# Patient Record
Sex: Male | Born: 1955 | Race: White | Hispanic: No | Marital: Single | Smoking: Current every day smoker
Health system: Southern US, Community
[De-identification: ages and names within clinical notes are randomized; demographics above are authoritative.]

## PROBLEM LIST (undated history)

## (undated) DIAGNOSIS — K08199 Complete loss of teeth due to other specified cause, unspecified class: Secondary | ICD-10-CM

## (undated) DIAGNOSIS — R7309 Other abnormal glucose: Secondary | ICD-10-CM

## (undated) DIAGNOSIS — Z8619 Personal history of other infectious and parasitic diseases: Secondary | ICD-10-CM

## (undated) DIAGNOSIS — E119 Type 2 diabetes mellitus without complications: Secondary | ICD-10-CM

## (undated) DIAGNOSIS — N400 Enlarged prostate without lower urinary tract symptoms: Secondary | ICD-10-CM

## (undated) DIAGNOSIS — Z8719 Personal history of other diseases of the digestive system: Secondary | ICD-10-CM

## (undated) DIAGNOSIS — M1990 Unspecified osteoarthritis, unspecified site: Secondary | ICD-10-CM

## (undated) DIAGNOSIS — K219 Gastro-esophageal reflux disease without esophagitis: Secondary | ICD-10-CM

## (undated) DIAGNOSIS — J439 Emphysema, unspecified: Secondary | ICD-10-CM

## (undated) DIAGNOSIS — G709 Myoneural disorder, unspecified: Secondary | ICD-10-CM

## (undated) DIAGNOSIS — J449 Chronic obstructive pulmonary disease, unspecified: Secondary | ICD-10-CM

## (undated) DIAGNOSIS — C50919 Malignant neoplasm of unspecified site of unspecified female breast: Secondary | ICD-10-CM

## (undated) DIAGNOSIS — C801 Malignant (primary) neoplasm, unspecified: Secondary | ICD-10-CM

## (undated) DIAGNOSIS — I10 Essential (primary) hypertension: Secondary | ICD-10-CM

## (undated) DIAGNOSIS — C50822 Malignant neoplasm of overlapping sites of left male breast: Secondary | ICD-10-CM

## (undated) DIAGNOSIS — Z170 Estrogen receptor positive status [ER+]: Secondary | ICD-10-CM

## (undated) DIAGNOSIS — Z720 Tobacco use: Secondary | ICD-10-CM

## (undated) DIAGNOSIS — Z8489 Family history of other specified conditions: Secondary | ICD-10-CM

## (undated) DIAGNOSIS — F419 Anxiety disorder, unspecified: Secondary | ICD-10-CM

## (undated) DIAGNOSIS — R0600 Dyspnea, unspecified: Secondary | ICD-10-CM

## (undated) DIAGNOSIS — B86 Scabies: Secondary | ICD-10-CM

## (undated) DIAGNOSIS — T7840XA Allergy, unspecified, initial encounter: Secondary | ICD-10-CM

## (undated) HISTORY — DX: Complete loss of teeth due to other specified cause, unspecified class: K08.199

## (undated) HISTORY — PX: SKIN CANCER EXCISION: SHX779

## (undated) HISTORY — PX: MOUTH SURGERY: SHX715

## (undated) HISTORY — DX: Personal history of other infectious and parasitic diseases: Z86.19

## (undated) HISTORY — DX: Scabies: B86

## (undated) HISTORY — DX: Myoneural disorder, unspecified: G70.9

## (undated) HISTORY — PX: LIPOMA EXCISION: SHX5283

## (undated) HISTORY — DX: Gastro-esophageal reflux disease without esophagitis: K21.9

## (undated) HISTORY — DX: Emphysema, unspecified: J43.9

## (undated) HISTORY — PX: CYST EXCISION: SHX5701

## (undated) HISTORY — DX: Malignant neoplasm of unspecified site of unspecified female breast: C50.919

## (undated) HISTORY — DX: Allergy, unspecified, initial encounter: T78.40XA

## (undated) HISTORY — DX: Anxiety disorder, unspecified: F41.9

---
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
## 2004-10-29 ENCOUNTER — Emergency Department: Payer: Self-pay | Admitting: General Practice

## 2004-10-29 ENCOUNTER — Other Ambulatory Visit: Payer: Self-pay

## 2020-01-12 ENCOUNTER — Other Ambulatory Visit: Payer: Self-pay

## 2020-01-14 ENCOUNTER — Ambulatory Visit (INDEPENDENT_AMBULATORY_CARE_PROVIDER_SITE_OTHER): Payer: Medicare Other

## 2020-01-14 ENCOUNTER — Ambulatory Visit (INDEPENDENT_AMBULATORY_CARE_PROVIDER_SITE_OTHER): Payer: Medicare Other | Admitting: Nurse Practitioner

## 2020-01-14 ENCOUNTER — Other Ambulatory Visit: Payer: Self-pay

## 2020-01-14 ENCOUNTER — Encounter: Payer: Self-pay | Admitting: Nurse Practitioner

## 2020-01-14 VITALS — BP 138/72 | HR 83 | Temp 97.4°F | Ht 74.0 in | Wt 232.0 lb

## 2020-01-14 DIAGNOSIS — Z6841 Body Mass Index (BMI) 40.0 and over, adult: Secondary | ICD-10-CM | POA: Diagnosis not present

## 2020-01-14 DIAGNOSIS — J449 Chronic obstructive pulmonary disease, unspecified: Secondary | ICD-10-CM | POA: Insufficient documentation

## 2020-01-14 DIAGNOSIS — Z720 Tobacco use: Secondary | ICD-10-CM | POA: Insufficient documentation

## 2020-01-14 DIAGNOSIS — R600 Localized edema: Secondary | ICD-10-CM | POA: Insufficient documentation

## 2020-01-14 DIAGNOSIS — R0600 Dyspnea, unspecified: Secondary | ICD-10-CM

## 2020-01-14 DIAGNOSIS — R2241 Localized swelling, mass and lump, right lower limb: Secondary | ICD-10-CM

## 2020-01-14 DIAGNOSIS — R079 Chest pain, unspecified: Secondary | ICD-10-CM | POA: Diagnosis not present

## 2020-01-14 DIAGNOSIS — Z0000 Encounter for general adult medical examination without abnormal findings: Secondary | ICD-10-CM

## 2020-01-14 DIAGNOSIS — N649 Disorder of breast, unspecified: Secondary | ICD-10-CM

## 2020-01-14 DIAGNOSIS — Z7189 Other specified counseling: Secondary | ICD-10-CM | POA: Insufficient documentation

## 2020-01-14 DIAGNOSIS — Z91030 Bee allergy status: Secondary | ICD-10-CM

## 2020-01-14 DIAGNOSIS — N6310 Unspecified lump in the right breast, unspecified quadrant: Secondary | ICD-10-CM | POA: Insufficient documentation

## 2020-01-14 DIAGNOSIS — R0609 Other forms of dyspnea: Secondary | ICD-10-CM

## 2020-01-14 HISTORY — DX: Localized swelling, mass and lump, right lower limb: R22.41

## 2020-01-14 LAB — LIPID PANEL
Cholesterol: 224 mg/dL — ABNORMAL HIGH (ref 0–200)
HDL: 38.1 mg/dL — ABNORMAL LOW (ref >39.00)
LDL Cholesterol: 154 mg/dL — ABNORMAL HIGH (ref 0–99)
NonHDL: 185.98
Total CHOL/HDL Ratio: 6
Triglycerides: 162 mg/dL — ABNORMAL HIGH (ref 0.0–149.0)
VLDL: 32.4 mg/dL (ref 0.0–40.0)

## 2020-01-14 LAB — TSH: TSH: 1.3 u[IU]/mL (ref 0.35–4.50)

## 2020-01-14 LAB — CBC WITH DIFFERENTIAL/PLATELET
Basophils Absolute: 0.1 10*3/uL (ref 0.0–0.1)
Basophils Relative: 0.8 % (ref 0.0–3.0)
Eosinophils Absolute: 0.3 10*3/uL (ref 0.0–0.7)
Eosinophils Relative: 2.8 % (ref 0.0–5.0)
HCT: 48.2 % (ref 39.0–52.0)
Hemoglobin: 16.2 g/dL (ref 13.0–17.0)
Lymphocytes Relative: 24.7 % (ref 12.0–46.0)
Lymphs Abs: 2.4 10*3/uL (ref 0.7–4.0)
MCHC: 33.6 g/dL (ref 30.0–36.0)
MCV: 89 fl (ref 78.0–100.0)
Monocytes Absolute: 0.9 10*3/uL (ref 0.1–1.0)
Monocytes Relative: 8.9 % (ref 3.0–12.0)
Neutro Abs: 6 10*3/uL (ref 1.4–7.7)
Neutrophils Relative %: 62.8 % (ref 43.0–77.0)
Platelets: 196 10*3/uL (ref 150.0–400.0)
RBC: 5.41 Mil/uL (ref 4.22–5.81)
RDW: 13.9 % (ref 11.5–15.5)
WBC: 9.6 10*3/uL (ref 4.0–10.5)

## 2020-01-14 LAB — COMPREHENSIVE METABOLIC PANEL
ALT: 17 U/L (ref 0–53)
AST: 16 U/L (ref 0–37)
Albumin: 4 g/dL (ref 3.5–5.2)
Alkaline Phosphatase: 56 U/L (ref 39–117)
BUN: 11 mg/dL (ref 6–23)
CO2: 27 mEq/L (ref 19–32)
Calcium: 9.1 mg/dL (ref 8.4–10.5)
Chloride: 100 mEq/L (ref 96–112)
Creatinine, Ser: 0.89 mg/dL (ref 0.40–1.50)
GFR: 85.57 mL/min (ref >60.00)
Glucose, Bld: 105 mg/dL — ABNORMAL HIGH (ref 70–99)
Potassium: 4 mEq/L (ref 3.5–5.1)
Sodium: 134 mEq/L — ABNORMAL LOW (ref 135–145)
Total Bilirubin: 0.5 mg/dL (ref 0.2–1.2)
Total Protein: 6.8 g/dL (ref 6.0–8.3)

## 2020-01-14 LAB — URINALYSIS, ROUTINE W REFLEX MICROSCOPIC
Bilirubin Urine: NEGATIVE
Hgb urine dipstick: NEGATIVE
Ketones, ur: NEGATIVE
Leukocytes,Ua: NEGATIVE
Nitrite: NEGATIVE
RBC / HPF: NONE SEEN (ref 0–?)
Specific Gravity, Urine: 1.02 (ref 1.000–1.030)
Total Protein, Urine: NEGATIVE
Urine Glucose: NEGATIVE
Urobilinogen, UA: 0.2 (ref 0.0–1.0)
pH: 7 (ref 5.0–8.0)

## 2020-01-14 MED ORDER — EPINEPHRINE 0.3 MG/0.3ML IJ SOAJ: 0.3000 mg | Freq: Once | INTRAMUSCULAR | Status: DC
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
## 2020-01-14 NOTE — Patient Instructions (Addendum)
It was wonderful to meet you today.  Please go to the lab and chest x-ray today down the hall.   I will place referral into cardiology to evaluate your chest tightness and shortness of breath with exertion.   Please take it easy and do not over exert yourself. If you develop chest pain that persists or has shortness of breath, call 911 for EMS transport to the hospital.  I have placed a order for mammogram and ultrasound of the right breast to check out the tenderness and lumps that she had noticed.  I have also placed a referral to general surgeon to check out that the lump along your right hip.  It causes you leg pain.  Follow-up with me in 1 week to go over labs, and make further recommendations as needed.   Please consider cutting back and stopping smoking.  Also, I would recommend healthy diet and consumption of less fast food at this time.  Try to elevate your legs when you are sitting to help with the ankle swelling.  I have refilled your EPI- pen- check the cost.   DASH Eating Plan DASH stands for "Dietary Approaches to Stop Hypertension." The DASH eating plan is a healthy eating plan that has been shown to reduce high blood pressure (hypertension). It may also reduce your risk for type 2 diabetes, heart disease, and stroke. The DASH eating plan may also help with weight loss. What are tips for following this plan?  General guidelines  Avoid eating more than 2,300 mg (milligrams) of salt (sodium) a day. If you have hypertension, you may need to reduce your sodium intake to 1,500 mg a day.  Limit alcohol intake to no more than 1 drink a day for nonpregnant women and 2 drinks a day for men. One drink equals 12 oz of beer, 5 oz of wine, or 1 oz of hard liquor.  Work with your health care provider to maintain a healthy body weight or to lose weight. Ask what an ideal weight is for you.  Get at least 30 minutes of exercise that causes your heart to beat faster (aerobic exercise)  most days of the week. Activities may include walking, swimming, or biking.  Work with your health care provider or diet and nutrition specialist (dietitian) to adjust your eating plan to your individual calorie needs. Reading food labels   Check food labels for the amount of sodium per serving. Choose foods with less than 5 percent of the Daily Value of sodium. Generally, foods with less than 300 mg of sodium per serving fit into this eating plan.  To find whole grains, look for the word "whole" as the first word in the ingredient list. Shopping  Buy products labeled as "low-sodium" or "no salt added."  Buy fresh foods. Avoid canned foods and premade or frozen meals. Cooking  Avoid adding salt when cooking. Use salt-free seasonings or herbs instead of table salt or sea salt. Check with your health care provider or pharmacist before using salt substitutes.  Do not fry foods. Cook foods using healthy methods such as baking, boiling, grilling, and broiling instead.  Cook with heart-healthy oils, such as olive, canola, soybean, or sunflower oil. Meal planning  Eat a balanced diet that includes: ? 5 or more servings of fruits and vegetables each day. At each meal, try to fill half of your plate with fruits and vegetables. ? Up to 6-8 servings of whole grains each day. ? Less than 6 oz of  lean meat, poultry, or fish each day. A 3-oz serving of meat is about the same size as a deck of cards. One egg equals 1 oz. ? 2 servings of low-fat dairy each day. ? A serving of nuts, seeds, or beans 5 times each week. ? Heart-healthy fats. Healthy fats called Omega-3 fatty acids are found in foods such as flaxseeds and coldwater fish, like sardines, salmon, and mackerel.  Limit how much you eat of the following: ? Canned or prepackaged foods. ? Food that is high in trans fat, such as fried foods. ? Food that is high in saturated fat, such as fatty meat. ? Sweets, desserts, sugary drinks, and other  foods with added sugar. ? Full-fat dairy products.  Do not salt foods before eating.  Try to eat at least 2 vegetarian meals each week.  Eat more home-cooked food and less restaurant, buffet, and fast food.  When eating at a restaurant, ask that your food be prepared with less salt or no salt, if possible. What foods are recommended? The items listed may not be a complete list. Talk with your dietitian about what dietary choices are best for you. Grains Whole-grain or whole-wheat bread. Whole-grain or whole-wheat pasta. Brown rice. [place]. Bulgur. Whole-grain and low-sodium cereals. Pita bread. Low-fat, low-sodium crackers. Whole-wheat flour tortillas. Vegetables Fresh or frozen vegetables (raw, steamed, roasted, or grilled). Low-sodium or reduced-sodium tomato and vegetable juice. Low-sodium or reduced-sodium tomato sauce and tomato paste. Low-sodium or reduced-sodium canned vegetables. Fruits All fresh, dried, or frozen fruit. Canned fruit in natural juice (without added sugar). Meat and other protein foods Skinless chicken or [place]. Ground chicken or [place]. Pork with fat trimmed off. Fish and seafood. Egg whites. Dried beans, peas, or lentils. Unsalted nuts, nut butters, and seeds. Unsalted canned beans. Lean cuts of beef with fat trimmed off. Low-sodium, lean deli meat. Dairy Low-fat (1%) or fat-free (skim) milk. Fat-free, low-fat, or reduced-fat cheeses. Nonfat, low-sodium ricotta or cottage cheese. Low-fat or nonfat yogurt. Low-fat, low-sodium cheese. Fats and oils Soft margarine without trans fats. Vegetable oil. Low-fat, reduced-fat, or light mayonnaise and salad dressings (reduced-sodium). Canola, safflower, olive, soybean, and sunflower oils. Avocado. Seasoning and other foods Herbs. Spices. Seasoning mixes without salt. Unsalted popcorn and pretzels. Fat-free sweets. What foods are not recommended? The items listed may not be a complete list. Talk with your dietitian  about what dietary choices are best for you. Grains Baked goods made with fat, such as croissants, muffins, or some breads. Dry pasta or rice meal packs. Vegetables Creamed or fried vegetables. Vegetables in a cheese sauce. Regular canned vegetables (not low-sodium or reduced-sodium). Regular canned tomato sauce and paste (not low-sodium or reduced-sodium). Regular tomato and vegetable juice (not low-sodium or reduced-sodium). Angie Fava. Olives. Fruits Canned fruit in a light or heavy syrup. Fried fruit. Fruit in cream or butter sauce. Meat and other protein foods Fatty cuts of meat. Ribs. Fried meat. Berniece Salines. Sausage. Bologna and other processed lunch meats. Salami. Fatback. Hotdogs. Bratwurst. Salted nuts and seeds. Canned beans with added salt. Canned or smoked fish. Whole eggs or egg yolks. Chicken or [place] with skin. Dairy Whole or 2% milk, cream, and half-and-half. Whole or full-fat cream cheese. Whole-fat or sweetened yogurt. Full-fat cheese. Nondairy creamers. Whipped toppings. Processed cheese and cheese spreads. Fats and oils Butter. Stick margarine. Lard. Shortening. Ghee. Bacon fat. Tropical oils, such as coconut, palm kernel, or palm oil. Seasoning and other foods Salted popcorn and pretzels. Onion salt, garlic salt, seasoned salt, table  salt, and sea salt. Worcestershire sauce. Tartar sauce. Barbecue sauce. Teriyaki sauce. Soy sauce, including reduced-sodium. Steak sauce. Canned and packaged gravies. Fish sauce. Oyster sauce. Cocktail sauce. Horseradish that you find on the shelf. Ketchup. Mustard. Meat flavorings and tenderizers. Bouillon cubes. Hot sauce and Tabasco sauce. Premade or packaged marinades. Premade or packaged taco seasonings. Relishes. Regular salad dressings. Where to find more information:  National Heart, Lung, and [place]: https://wilson-eaton.com/  American Heart Association: www.heart.org Summary  The DASH eating plan is a healthy eating plan that has been shown  to reduce high blood pressure (hypertension). It may also reduce your risk for type 2 diabetes, heart disease, and stroke.  With the DASH eating plan, you should limit salt (sodium) intake to 2,300 mg a day. If you have hypertension, you may need to reduce your sodium intake to 1,500 mg a day.  When on the DASH eating plan, aim to eat more fresh fruits and vegetables, whole grains, lean proteins, low-fat dairy, and heart-healthy fats.  Work with your health care provider or diet and nutrition specialist (dietitian) to adjust your eating plan to your individual calorie needs. This information is not intended to replace advice given to you by your health care provider. Make sure you discuss any questions you have with your health care provider. Document Revised: 08/03/2017 Document Reviewed: 08/14/2016 Elsevier Patient Education  2020 [employer].

## 2020-01-15 ENCOUNTER — Encounter: Payer: Self-pay | Admitting: Nurse Practitioner

## 2020-01-15 DIAGNOSIS — Z91030 Bee allergy status: Secondary | ICD-10-CM

## 2020-01-15 HISTORY — DX: Bee allergy status: Z91.030

## 2020-01-15 NOTE — Assessment & Plan Note (Signed)
Smoking cessation  

## 2020-01-15 NOTE — Assessment & Plan Note (Signed)
This maybe a lipoma but seems to cause leg symptoms when it is compressed. General surgery referral for opinion.

## 2020-01-15 NOTE — Assessment & Plan Note (Signed)
CXR and cardiology referral.  Smoking cessation discussed

## 2020-01-15 NOTE — Assessment & Plan Note (Addendum)
Healthy diet choices discussed, less eating out, diet info sheet on AVS. Will check lipids, A1c, routine labs.

## 2020-01-15 NOTE — Assessment & Plan Note (Addendum)
Several year hx. No varicose veins. Check labs, CXR, Cardiology referral. BP 138/72, 83, no murmur. Lungs CTA.

## 2020-01-15 NOTE — Assessment & Plan Note (Signed)
EKG today- having no symptoms showed NSR and no acute St-T wave changes. Cardiology referral for exertional CP and DOE. Avoid heavy exercise until symptoms further evaluated.

## 2020-01-15 NOTE — Assessment & Plan Note (Signed)
Diagnostic mammogram and US ordered.

## 2020-01-15 NOTE — Assessment & Plan Note (Signed)
Reports anaphylaxis with one honey bee sting to the right neck years ago. Refilled his Epi pen Rx to have on hand. 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

## 2020-01-19 ENCOUNTER — Other Ambulatory Visit: Payer: Self-pay

## 2020-01-19 ENCOUNTER — Encounter: Payer: Self-pay | Admitting: Cardiology

## 2020-01-19 ENCOUNTER — Ambulatory Visit (INDEPENDENT_AMBULATORY_CARE_PROVIDER_SITE_OTHER): Payer: Medicare Other | Admitting: Cardiology

## 2020-01-19 VITALS — BP 170/82 | HR 72 | Ht 74.0 in | Wt 230.2 lb

## 2020-01-19 DIAGNOSIS — E7800 Pure hypercholesterolemia, unspecified: Secondary | ICD-10-CM | POA: Diagnosis not present

## 2020-01-19 DIAGNOSIS — F17200 Nicotine dependence, unspecified, uncomplicated: Secondary | ICD-10-CM

## 2020-01-19 DIAGNOSIS — R079 Chest pain, unspecified: Secondary | ICD-10-CM

## 2020-01-19 DIAGNOSIS — R0602 Shortness of breath: Secondary | ICD-10-CM | POA: Diagnosis not present
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
## 2020-01-19 NOTE — Patient Instructions (Signed)
Medication Instructions:  Your physician recommends that you continue on your current medications as directed. Please refer to the Current Medication list given to you today.  *If you need a refill on your cardiac medications before your next appointment, please call your pharmacy*   Lab Work: None ordered If you have labs (blood work) drawn today and your tests are completely normal, you will receive your results only by: [name] Kitchen MyChart Message (if you have MyChart) OR . A paper copy in the mail If you have any lab test that is abnormal or we need to change your treatment, we will call you to review the results.   Testing/Procedures: Your physician has requested that you have an echocardiogram. Echocardiography is a painless test that uses sound waves to create images of your heart. It provides your doctor with information about the size and shape of your heart and how well your heart's chambers and valves are working. This procedure takes approximately one hour. There are no restrictions for this procedure.  Your physician has requested that you have an exercise tolerance test. For further information please visit [email or web address]. Please also follow instruction sheet, as given.     Follow-Up: At [facility], you and your health needs are our priority.  As part of our continuing mission to provide you with exceptional heart care, we have created designated Provider Care Teams.  These Care Teams include your primary Cardiologist (physician) and Advanced Practice Providers (APPs -  Physician Assistants and Nurse Practitioners) who all work together to provide you with the care you need, when you need it.  We recommend signing up for the patient portal called "MyChart".  Sign up information is provided on this After Visit Summary.  MyChart is used to connect with patients for Virtual Visits (Telemedicine).  Patients are able to view lab/test results, encounter notes, upcoming appointments,  etc.  Non-urgent messages can be sent to your provider as well.   To learn more about what you can do with MyChart, go to [email or web address].    Your next appointment:   After testing   The format for your next appointment:   In Person  Provider:    You may see Kate Sable, MD or one of the following Advanced Practice Providers on your designated Care Team:    Murray Hodgkins, NP  Christell Faith, PA-C  Marrianne Mood, PA-C    Other Instructions: Exercise Stress Test  An exercise stress test is a test that is done to collect information about how your heart functions during exercise. The test is done while you are walking on a treadmill or using an exercise bike. The goal is to raise your heart rate and "stress" the heart. The heart is evaluated before, during, and after you exercise. An electrocardiogram (ECG) will be used to monitor the heart, and your blood pressure will also be monitored. In some cases, nuclear scanning or an ultrasound of the heart (echocardiogram) will also be done to evaluate your heart. An exercise stress test is done to look for coronary artery disease (CAD). The test may also be done to:  Evaluate your limits of exercise during cardiac rehabilitation.  Check for high blood pressure during exercise.  Check how well you can exercise after such treatments as coronary stenting or new medicines.  Check for problems with blood flow to your arms and legs during exercise. If you have an abnormal test result, this may mean that you are not getting enough blood flow to your heart  during exercise. More testing may be needed to understand why your test was not normal. Tell a health care provider about:  Any allergies you have.  All medicines you are taking, including vitamins, herbs, eye drops, creams, and over-the-counter medicines.  Any blood disorders you have.  Any surgeries you have had.  Any medical conditions you have.  Whether you are  pregnant or may be pregnant. What are the risks? Generally, this is a safe procedure. However, problems may occur, including:  Pain or pressure in the following areas: ? Chest. ? Jaw or neck. ? Between your shoulder blades. ? Down your left arm.  Dizziness or lightheadedness.  Shortness of breath.  Increased or irregular heartbeats.  Nausea or vomiting.  Heart attack (rare).  Life-threatening abnormal heart rhythm (rare). What happens before the procedure?  Follow instructions from your health care provider about eating or drinking restrictions. ? You may be told to avoid all forms of caffeine for 24 hours before the test. This includes coffee, tea (even decaffeinated tea), caffeinated sodas, chocolate, cocoa, and certain pain medicines.  Ask your health care provider about: ? Taking over-the-counter medicines, vitamins, herbs, and supplements. ? Changing or stopping your regular medicines. This is especially important if you are taking diabetes medicines or beta-blocker medicines.  If you have diabetes, ask how you are to take your insulin or pills. It is common to adjust your insulin dose the morning of the test.  If you are taking beta-blocker medicines, it is important to talk to your health care provider about these medicines well before the date of your test. Taking beta-blocker medicines may interfere with the test. In some cases, these medicines may need to be changed or stopped 24 hours or more before the test.  If you wear a nitroglycerin patch, it may need to be removed prior to the test. Ask your health care provider if the patch should be removed before the test.  If you use an inhaler for any breathing condition, bring it with you to the test.  Do not apply lotions, powders, creams, or oils on your chest prior to the test.  Wear loose-fitting clothes and comfortable walking shoes.  Do not use any products that contain nicotine or tobacco, such as cigarettes and  e-cigarettes, for 4 hours before the test or as told by your health care provider. If you need help quitting, ask your health care provider. What happens during the procedure?  Multiple electrodes will be attached to your chest.  Multiple wires will be attached to the electrodes. These will transfer the electrical impulses from your heart to the ECG machine. Your heart will be monitored both at rest and while exercising.  If you are also having an echocardiogram or nuclear scanning, images of your heart will be taken before and after you exercise.  A blood pressure cuff will be placed around your arm to measure your blood pressure throughout the test. You will feel it tighten and loosen throughout the test.  You will walk on a treadmill or use a stationary bike. If you cannot use these, you may be asked to turn a crank with your hands.  You will start at a slow pace or level on the exercise machine. The exercise difficulty will be slowly increased to raise your heart rate. In the case of a treadmill, the speed and incline will gradually be increased.  You may be asked to periodically breathe into a tube. This measures the gases you breathe out.  You will be asked how you are feeling throughout the test. You will be asked to rate your level of exertion.  Tell the staff right away if you feel: ? Chest pain. ? Dizziness. ? Shortness of breath. ? Too fatigued to continue. ? Pain or aching in your legs or arms.  You will exercise until you have symptoms or until you reach a target heart rate. The test will also be stopped if you have changes in your blood pressure or ECG readings, or if you develop an irregular heartbeat (arrhythmia). The procedure may vary among health care providers and hospitals. What happens after the procedure?  You will sit down and recover from the exercise. Your blood pressure, heart rate, and ECG will be monitored until you recover.  You may return to your normal  schedule, including diet, activities, and medicines, unless your health care provider tells you otherwise.  It is up to you to get your test results. Ask your health care provider, or the department that is doing the test, when your results will be ready. Summary  An exercise stress test is a test that is done to collect information about how your heart functions during exercise.  This test is done to look for coronary artery disease (CAD).  During this test, you will walk on a treadmill or use an exercise bike to raise your heart rate.  It is important to follow instructions from your health care provider about eating and drinking restrictions before the test. This may include avoiding caffeine and certain medicines before the test. This information is not intended to replace advice given to you by your health care provider. Make sure you discuss any questions you have with your health care provider. Document Revised: 05/24/2017 Document Reviewed: 10/25/2016 Elsevier Patient Education  2020 [employer].

## 2020-01-21 ENCOUNTER — Encounter: Payer: Self-pay | Admitting: Surgery

## 2020-01-21 ENCOUNTER — Ambulatory Visit (INDEPENDENT_AMBULATORY_CARE_PROVIDER_SITE_OTHER): Payer: Medicare Other | Admitting: Surgery

## 2020-01-21 ENCOUNTER — Ambulatory Visit: Payer: Medicare Other | Admitting: Surgery

## 2020-01-21 ENCOUNTER — Ambulatory Visit
Admission: RE | Admit: 2020-01-21 | Discharge: 2020-01-21 | Disposition: A | Discharge status: Home / Self Care | Payer: Medicare Other | Source: Ambulatory Visit | Attending: Nurse Practitioner | Admitting: Nurse Practitioner

## 2020-01-21 ENCOUNTER — Other Ambulatory Visit: Payer: Self-pay

## 2020-01-21 ENCOUNTER — Telehealth: Payer: Self-pay | Admitting: Nurse Practitioner

## 2020-01-21 VITALS — BP 167/79 | HR 77 | Temp 98.6°F | Resp 15 | Ht 74.0 in | Wt 231.0 lb

## 2020-01-21 DIAGNOSIS — N6310 Unspecified lump in the right breast, unspecified quadrant: Secondary | ICD-10-CM

## 2020-01-21 DIAGNOSIS — N649 Disorder of breast, unspecified: Secondary | ICD-10-CM | POA: Insufficient documentation

## 2020-01-21 DIAGNOSIS — R1031 Right lower quadrant pain: Secondary | ICD-10-CM

## 2020-01-21 DIAGNOSIS — D179 Benign lipomatous neoplasm, unspecified: Secondary | ICD-10-CM

## 2020-01-21 DIAGNOSIS — Z91030 Bee allergy status: Secondary | ICD-10-CM

## 2020-01-21 DIAGNOSIS — N62 Hypertrophy of breast: Secondary | ICD-10-CM

## 2020-01-21 DIAGNOSIS — M7989 Other specified soft tissue disorders: Secondary | ICD-10-CM | POA: Diagnosis not present

## 2020-01-21 MED ORDER — EPINEPHRINE 0.3 MG/0.3ML IJ SOAJ: 0.3000 mg | INTRAMUSCULAR | 1 refills | Status: DC | PRN

## 2020-01-21 NOTE — Telephone Encounter (Signed)
I called Benjamin Armstrong to review the  mammogram findings with him:  symptomatic benign gynecomastia.   Per UTD: He meets criteria for additional test because he has a >4 cm area of firmness, and tenderness- onset 3 mos ago. Te right breast complaint is what brought him to the clinic for this initial encounter.   PLAN:  He agrees to come in next week for laboratory tests:   testosterone, estradiol, LH. His insurance would not cover him for BHCG and so I will ask him to sign a waiver. Also, I d/w Dr. Nicki Reaper and we will consider an  ENDOCRINOLOGY consult after reviewing these labs.   He needs a lab appt for end of next week and if he will agree to the $27 BHCG test that is also recommended- the ABN sheet is her for him to sign.

## 2020-01-21 NOTE — Patient Instructions (Addendum)
You are scheduled for a CT scan of the Pelvis at [facility] on Friday May 28th at 1:30 pm. You will need to arrive there by 1:15 pm. You may only have water for 4 hours prior to the scan.    You will follow up in our office the following week to discuss the next steps with Dr Dahlia Byes.
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
## 2020-01-22 ENCOUNTER — Telehealth: Payer: Self-pay | Admitting: *Deleted

## 2020-01-22 NOTE — Telephone Encounter (Signed)
Spoke with patient and he will be coming in on 01/26/20 at 4pm for labs. I will have ABN for patient to sign when he comes in for lab draw.

## 2020-01-22 NOTE — Telephone Encounter (Signed)
We received a ABN for pt to sign upon lab draw for a HCG pregnancy test, however there is no order for it to be drawn. Please place future order & ask one of the providers how to bypass the "hard stop"

## 2020-01-22 NOTE — Telephone Encounter (Signed)
Unable to get Dx  to order BHCG. Will cancel the order for now.

## 2020-01-22 NOTE — Addendum Note (Signed)
Addended by: Denice Paradise A on: 01/22/2020 12:53 PM   Modules accepted: Orders

## 2020-01-26 ENCOUNTER — Other Ambulatory Visit (INDEPENDENT_AMBULATORY_CARE_PROVIDER_SITE_OTHER): Payer: Medicare Other

## 2020-01-26 ENCOUNTER — Other Ambulatory Visit: Payer: Self-pay

## 2020-01-26 DIAGNOSIS — N6310 Unspecified lump in the right breast, unspecified quadrant: Secondary | ICD-10-CM

## 2020-01-26 DIAGNOSIS — N62 Hypertrophy of breast: Secondary | ICD-10-CM

## 2020-01-26 LAB — LUTEINIZING HORMONE: LH: 5.9 m[IU]/mL (ref 1.6–15.2)

## 2020-01-26 LAB — ESTRADIOL: Estradiol: 31 pg/mL (ref <=39)

## 2020-01-26 LAB — TESTOSTERONE: Testosterone: 125 ng/dL — ABNORMAL LOW (ref 250–827)

## 2020-01-26 NOTE — Addendum Note (Signed)
Addended by: Nanci Pina on: 01/26/2020 03:05 PM   Modules accepted: Orders

## 2020-01-27 ENCOUNTER — Telehealth: Payer: Self-pay | Admitting: Nurse Practitioner

## 2020-01-27 LAB — HUMAN CHORIONIC GONADOTROPIN(HCG),B-SUBUNIT,QUANTITATIVE): HCG, Beta Chain, Quant, S: <1 m[IU]/mL (ref 0–3)

## 2020-01-27 NOTE — Telephone Encounter (Signed)
His labs are normal with exception of a low Testerone. For his breast mass/NEG mammogram for malignancy. A CT scan was ordered by GI to look at fatty tissue growth. We should see the adrenal glands with this.   Following an UTD algorithm for a pt with breast enlargement, after these labs, he is now recommended to have a serum prolactin. If this returns elevated, then need to consider prolactin-secreting tumor. If serum prolactin is negative- consider secondary hypogonadism.   I will refer him to ENDOCRINOLOGY for further testing as it is recommended.

## 2020-01-30 ENCOUNTER — Ambulatory Visit
Admission: RE | Admit: 2020-01-30 | Discharge: 2020-01-30 | Disposition: A | Discharge status: Home / Self Care | Payer: Medicare Other | Source: Ambulatory Visit | Attending: Surgery | Admitting: Surgery

## 2020-01-30 ENCOUNTER — Other Ambulatory Visit: Payer: Self-pay

## 2020-01-30 DIAGNOSIS — M7989 Other specified soft tissue disorders: Secondary | ICD-10-CM | POA: Diagnosis present

## 2020-01-30 DIAGNOSIS — D179 Benign lipomatous neoplasm, unspecified: Secondary | ICD-10-CM

## 2020-01-30 DIAGNOSIS — R1031 Right lower quadrant pain: Secondary | ICD-10-CM

## 2020-01-30 HISTORY — DX: Malignant (primary) neoplasm, unspecified: C80.1

## 2020-01-30 MED ORDER — IOHEXOL 300 MG/ML  SOLN
100.0000 mL | Freq: Once | INTRAMUSCULAR | Status: AC | PRN
  Administered 2020-01-30: 100 mL via INTRAVENOUS

## 2020-02-03 ENCOUNTER — Telehealth: Payer: Self-pay | Admitting: Emergency Medicine

## 2020-02-03 NOTE — Telephone Encounter (Signed)
-----   Message from Jules Husbands, MD sent at 02/03/2020  7:20 AM EDT ----- Please let him know that CT showed lipoma as expected. Keep f/u appt ----- Message ----- From: Interface, Rad Results In Sent: 01/31/2020   9:50 AM EDT To: Jules Husbands, MD

## 2020-02-03 NOTE — Telephone Encounter (Signed)
Pt made aware of results and also reminded of f/u appt tomorrow with Dr Dahlia Byes. Voiced understanding and has no further questions at this time.

## 2020-02-04 ENCOUNTER — Encounter: Payer: Self-pay | Admitting: Surgery

## 2020-02-04 ENCOUNTER — Ambulatory Visit (INDEPENDENT_AMBULATORY_CARE_PROVIDER_SITE_OTHER): Payer: Medicare Other | Admitting: Surgery

## 2020-02-04 ENCOUNTER — Other Ambulatory Visit: Payer: Self-pay

## 2020-02-04 VITALS — BP 154/74 | HR 86 | Temp 97.7°F | Resp 15 | Ht 74.0 in | Wt 231.6 lb

## 2020-02-04 DIAGNOSIS — M7989 Other specified soft tissue disorders: Secondary | ICD-10-CM | POA: Diagnosis not present

## 2020-02-04 NOTE — Patient Instructions (Addendum)
We will schedule you for removal of your lipomas in office in 3 weeks.   Lipoma Removal  Lipoma removal is a surgical procedure to remove a lipoma, which is a noncancerous (benign) tumor that is made up of fat cells. Most lipomas are small and painless and do not require treatment. They can form in many areas of the body but are most common under the skin of the back, arms, shoulders, buttocks, and thighs. You may need lipoma removal if you have a lipoma that is large, growing, or causing discomfort. Lipoma removal may also be done for cosmetic reasons. Tell a health care provider about:  Any allergies you have.  All medicines you are taking, including vitamins, herbs, eye drops, creams, and over-the-counter medicines.  Any problems you or family members have had with anesthetic medicines.  Any blood disorders you have.  Any surgeries you have had.  Any medical conditions you have.  Whether you are pregnant or may be pregnant. What are the risks? Generally, this is a safe procedure. However, problems may occur, including:  Infection.  Bleeding.  Scarring.  Allergic reactions to medicines.  Damage to nearby structures or organs, such as damage to nerves or blood vessels near the lipoma.  Medicines Ask your health care provider about:  Changing or stopping your regular medicines. This is especially important if you are taking diabetes medicines or blood thinners.  Taking medicines such as aspirin and ibuprofen. These medicines can thin your blood. Do not take these medicines unless your health care provider tells you to take them.  Taking over-the-counter medicines, vitamins, herbs, and supplements. General instructions  You will have a physical exam. Your health care provider will check the size of the lipoma and whether it can be moved easily.  You may have a biopsy and imaging tests, such as X-rays, a CT scan, and an MRI.  Do not use any products that contain nicotine  or tobacco for at least 4 weeks before the procedure. These products include cigarettes, e-cigarettes, and chewing tobacco. If you need help quitting, ask your health care provider.  Ask your health care provider: ? How your surgery site will be marked. ? What steps will be taken to help prevent infection. These may include:  Washing skin with a germ-killing soap.  Taking antibiotic medicine.  Plan to have someone take you home from the hospital or clinic.  If you will be going home right after the procedure, plan to have someone with you for 24 hours. What happens during the procedure?   You will be given one or more of the following: ? A medicine to numb the area (local anesthetic).   An incision will be made over the lipoma or very near the lipoma. The incision may be made in a natural skin line or crease.  Tissues, nerves, and blood vessels near the lipoma will be moved out of the way.  The lipoma and the capsule that surrounds it will be separated from the surrounding tissues.  The lipoma will be removed.  The incision may be closed with stitches (sutures).  A bandage (dressing) will be placed over the incision. The procedure may vary among health care providers and hospitals. What happens after the procedure?  Your blood pressure, heart rate, breathing rate, and blood oxygen level will be monitored until you leave the hospital or clinic.  If you were prescribed an antibiotic medicine, use it as told by your health care provider. Do not stop using the  antibiotic even if you start to feel better. Summary  Before the procedure, follow instructions from your health care provider about eating and drinking, and changing or stopping your regular medicines. This is especially important if you are taking diabetes medicines or blood thinners.  After the lipoma is removed, the incision may be closed with stitches (sutures) and covered with a bandage (dressing).  If you were  given a sedative during the procedure, it can affect you for several hours. Do not drive or operate machinery until your health care provider says that it is safe. This information is not intended to replace advice given to you by your health care provider. Make sure you discuss any questions you have with your health care provider. Document Revised: 04/07/2019 Document Reviewed: 04/07/2019 Elsevier Patient Education  [place].

## 2020-02-05 ENCOUNTER — Encounter: Payer: Self-pay | Admitting: Surgery

## 2020-02-05 ENCOUNTER — Ambulatory Visit (INDEPENDENT_AMBULATORY_CARE_PROVIDER_SITE_OTHER): Payer: Medicare Other

## 2020-02-05 ENCOUNTER — Telehealth: Payer: Self-pay | Admitting: Nurse Practitioner

## 2020-02-05 ENCOUNTER — Other Ambulatory Visit: Payer: Self-pay

## 2020-02-05 DIAGNOSIS — R0602 Shortness of breath: Secondary | ICD-10-CM

## 2020-02-05 DIAGNOSIS — N6310 Unspecified lump in the right breast, unspecified quadrant: Secondary | ICD-10-CM
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
## 2020-02-08 ENCOUNTER — Telehealth: Payer: Self-pay | Admitting: Nurse Practitioner

## 2020-02-08 NOTE — Telephone Encounter (Signed)
He needs OV with me in August-preventative care. Please set this up if he is willing. He is having a stress test and lipomas removed June and cardiac f/up in July.Thank you.

## 2020-02-08 NOTE — Telephone Encounter (Signed)
A prolactin level is to be drawn following an algorithm for males with his breast changes and we need one more test- serum prolactin. This is very important- because if it is positive- then he could have a prolactin-secreting pituitary tumor as the cause for his right breast changes.

## 2020-02-09 ENCOUNTER — Other Ambulatory Visit (INDEPENDENT_AMBULATORY_CARE_PROVIDER_SITE_OTHER): Payer: Medicare Other

## 2020-02-09 ENCOUNTER — Other Ambulatory Visit: Payer: Self-pay

## 2020-02-09 DIAGNOSIS — N6310 Unspecified lump in the right breast, unspecified quadrant: Secondary | ICD-10-CM | POA: Diagnosis not present

## 2020-02-10 LAB — PROLACTIN: Prolactin: 5.6 ng/mL (ref 2.0–18.0)

## 2020-02-10 NOTE — Telephone Encounter (Signed)
Patient scheduled appt for august 25th at 10am

## 2020-02-11 ENCOUNTER — Telehealth: Payer: Self-pay

## 2020-02-11 ENCOUNTER — Ambulatory Visit (INDEPENDENT_AMBULATORY_CARE_PROVIDER_SITE_OTHER): Payer: Medicare Other

## 2020-02-11 ENCOUNTER — Other Ambulatory Visit: Payer: Self-pay

## 2020-02-11 DIAGNOSIS — R079 Chest pain, unspecified: Secondary | ICD-10-CM | POA: Diagnosis not present

## 2020-02-11 DIAGNOSIS — R0602 Shortness of breath: Secondary | ICD-10-CM

## 2020-02-11 LAB — EXERCISE TOLERANCE TEST
Estimated workload: 4.4 METS
Exercise duration (min): 1 min
Exercise duration (sec): 56 s
MPHR: 155 {beats}/min
Peak HR: 108 {beats}/min
Percent HR: 69 %
Rest HR: 92 {beats}/min

## 2020-02-11 MED ORDER — LOSARTAN POTASSIUM 50 MG PO TABS
50.0000 mg | ORAL_TABLET | Freq: Every day | ORAL | 5 refills | Status: DC
Start: 2020-02-11 — End: 2020-03-05

## 2020-02-11 NOTE — Telephone Encounter (Addendum)
Patient came to the office today for his scheduled gxt. His resting BP prior to exercise was 191/78 92 bpm. We proceeded with the test since he was under the <639 systolic protocol.  The patient did smoke cigarettes this morning and reports smoking a pack and a half daily. He does not ck his BP at home.  The patients exercise capacity was very poor and he requested to stop the test after 1 1/2 mins. His first recovery BP was 238/77. After 2 BP cycles his BP went down to 181/ 81 85bpm.   We asked him to start monitoring his BP and HR daily and to start a BP log.  We released him to go home and advised him that we will send a message to his primary cardiologist Dr. Garen Lah to give his recommendation regarding his BP. The patient was told that he will be called later today with Dr. Thereasa Solo recommendation.  The ECG stress finding have been placed on Dr. Thereasa Solo desk.

## 2020-02-11 NOTE — Progress Notes (Signed)
His prolactin is normal. 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 He has declined ENDOCRINOLOGY referral. We can discuss at next OV in Aug.

## 2020-02-11 NOTE — Telephone Encounter (Signed)
Patient made aware of Dr. Thereasa Solo recommendation. Start losartan 50 mg daily. An Rx has been sent to the patients pharmacy. Patient is to f/u with Dr. Garen Lah   As planned on 03/05/20. Patient will monitor his BP daily at home. Adv the patient to bring his readings with him to his next appt. He is to contact the office sooner if his BP is consistently elevated at home.  Patient verbalized understanding and voiced appreciation for the call.

## 2020-02-11 NOTE — Telephone Encounter (Addendum)
Attempted to contact the patient. Unable to lmom. Patients voicemail is not set up. Will attempt to reach the patient again.  DPR on file. Attempted to reach Benjamin Armstrong on the home number listed. Incorrect telephone number is listed in the chart.  Spoke with Dr. Garen Lah.  The patient was hypertensive when he was seen in the office on 01/19/20. Reviewed todays notes from the patients gxt with Dr. Garen Lah.  Dr. Garen Lah instructions are for the patient to start Losartan 50 mg daily. He is to monitor his BP daily and record his readings. Patient is to keep his 03/05/20 appt with [facility] and bring his readings with him to his appt.

## 2020-02-12 ENCOUNTER — Telehealth: Payer: Self-pay

## 2020-02-12 NOTE — Telephone Encounter (Signed)
Spoke with patient regarding Dr.Agbor-Etang's result note from his stress test yesterday, and relayed the following: "Poor functional capacity, hypertensive response with exercise, no evidence for ischemia at submaximal stress levels. Keep follow-up appointment." Patient understood that he will review these findings with Dr. Garen Lah at his follow up on 03/05/20.  Patient also stated he will start his new Losartan 50mg  daily this weekend, as he wants to be home when he starts this new medication in case he experiences any side effects.  He will record his blood pressures daily about an hour after taking his medication, and agreed to let us know if his Systolic BP is consistently above 160. Patient verbalized understanding and agreed with plan.

## 2020-02-25 ENCOUNTER — Encounter: Payer: Self-pay | Admitting: Surgery

## 2020-02-25 ENCOUNTER — Other Ambulatory Visit: Payer: Self-pay

## 2020-02-25 ENCOUNTER — Ambulatory Visit (INDEPENDENT_AMBULATORY_CARE_PROVIDER_SITE_OTHER): Payer: Medicare Other | Admitting: Surgery

## 2020-02-25 VITALS — BP 148/84 | HR 79 | Temp 97.9°F | Resp 14 | Ht 74.0 in | Wt 221.0 lb

## 2020-02-25 DIAGNOSIS — D1723 Benign lipomatous neoplasm of skin and subcutaneous tissue of right leg: Secondary | ICD-10-CM | POA: Diagnosis not present

## 2020-02-25 DIAGNOSIS — M7989 Other specified soft tissue disorders: Secondary | ICD-10-CM | POA: Diagnosis not present

## 2020-02-25 NOTE — Patient Instructions (Addendum)
We have removed a Cyst in our office today.  You have sutures under the skin that will dissolve and also dermabond (skin glue) on top of your skin which will come off on it's own in 10-14 days.  You make take Tylenol and Ibuprofen for pain as needed. You may use ice packs to help reduce swelling.   You may shower in 48 hours, this is on 02/27/20.  Avoid Strenuous activities that will make you sweat during the next 48 hours to avoid the glue coming off prematurely. Avoid activities that will place pressure to this area of the body for 1-2 weeks to avoid re-injury to incision site.  Please see your follow-up appointment provided. We will see you back in office to make sure this area is healed and to review the final pathology. If you have any questions or concerns prior to this appointment, call our office and speak with a nurse.   Excision of Skin Cysts or Lesions Excision of a skin lesion refers to the removal of a section of skin by making small cuts (incisions) in the skin. This procedure may be done to remove a cancerous (malignant) or noncancerous (benign) growth on the skin. It is typically done to treat or prevent cancer or infection. It may also be done to improve cosmetic appearance. The procedure may be done to remove:  Cancerous growths, such as basal cell carcinoma, squamous cell carcinoma, or melanoma.  Noncancerous growths, such as a cyst or lipoma.  Growths, such as moles or skin tags, which may be removed for cosmetic reasons.  Various excision or surgical techniques may be used depending on your condition, the location of the lesion, and your overall health. Tell a health care provider about:  Any allergies you have.  All medicines you are taking, including vitamins, herbs, eye drops, creams, and over-the-counter medicines.  Any problems you or family members have had with anesthetic medicines.  Any blood disorders you have.  Any surgeries you have had.  Any medical  conditions you have.  Whether you are pregnant or may be pregnant. What are the risks? Generally, this is a safe procedure. However, problems may occur, including:  Bleeding.  Infection.  Scarring.  Recurrence of the cyst, lipoma, or cancer.  Changes in skin sensation or appearance, such as discoloration or swelling.  Reaction to the anesthetics.  Allergic reaction to surgical materials or ointments.  Damage to nerves, blood vessels, muscles, or other structures.  Continued pain.  What happens before the procedure?  Ask your health care provider about: ? Changing or stopping your regular medicines. This is especially important if you are taking diabetes medicines or blood thinners. ? Taking medicines such as aspirin and ibuprofen. These medicines can thin your blood. Do not take these medicines before your procedure if your health care provider instructs you not to.  You may be asked to take certain medicines.  You may be asked to stop smoking.  You may have an exam or testing.  Plan to have someone take you home after the procedure.  Plan to have someone help you with activities during recovery. What happens during the procedure?  To reduce your risk of infection: ? Your health care team will wash or sanitize their hands. ? Your skin will be washed with soap.  You will be given a medicine to numb the area (local anesthetic).  One of the following excision techniques will be performed.  At the end of any of these procedures, antibiotic  ointment will be applied as needed. Each of the following techniques may vary among health care providers and hospitals. Complete Surgical Excision The area of skin that needs to be removed will be marked with a pen. Using a small scalpel or scissors, the surgeon will gently cut around and under the lesion until it is completely removed. The lesion will be placed in a fluid and sent to the lab for examination. If necessary, bleeding  will be controlled with a device that delivers heat (electrocautery). The edges of the wound may be stitched (sutured) together, and a bandage (dressing) will be applied. This procedure may be performed to treat a cancerous growth or a noncancerous cyst or lesion. Excision of a Cyst The surgeon will make an incision on the cyst. The entire cyst will be removed through the incision. The incision may be closed with sutures. Shave Excision During shave excision, the surgeon will use a small blade or an electrically heated loop instrument to shave off the lesion. This may be done to remove a mole or a skin tag. The wound will usually be left to heal on its own without sutures. Punch Excision During punch excision, the surgeon will use a small tool that is like a cookie cutter or a hole punch to cut a circle shape out of the skin. The outer edges of the skin will be sutured together. This may be done to remove a mole or a scar or to perform a biopsy of the lesion. Mohs Micrographic Surgery During Mohs micrographic surgery, layers of the lesion will be removed with a scalpel or a loop instrument and will be examined right away under a microscope. Layers will be removed until all of the abnormal or cancerous tissue has been removed. This procedure is minimally invasive, and it ensures the best cosmetic outcome. It involves the removal of as little normal tissue as possible. Mohs is usually done to treat skin cancer, such as basal cell carcinoma or squamous cell carcinoma, particularly on the face and ears. Depending on the size of the surgical wound, it may be sutured closed. What happens after the procedure?  Return to your normal activities as told by your health care provider.  Talk with your health care provider to discuss any test results, treatment options, and if necessary, the need for more tests. This information is not intended to replace advice given to you by your health care provider. Make sure  you discuss any questions you have with your health care provider. Document Released: 11/15/2009 Document Revised: 01/27/2016 Document Reviewed: 10/07/2014 Elsevier Interactive Patient Education  [employer].  Today we have removed a Lipoma in our office. Please see information below regarding this type of tumor.  You are free to shower in 48 hours. This will be on 02/27/20.   You have glue on your skin and sutures under the skin. The glue will come off on it's own in 10-14 days. You may shower normally until this occurs but do not submerge.  Please use Tylenol or Ibuprofen for pain as needed.  We will see you back in 2 weeks to ensure that this has healed and to review the final pathology. Please see your appointment below. You may continue your regular activities right away but if you are having pain while doing something, stop what you are doing and try this activity once again in 3 days. Please call our office with any questions or concerns prior to your appointment.   Lipoma Removal  Lipoma removal is a surgical procedure to remove a noncancerous (benign) tumor that is made up of fat cells (lipoma). Most lipomas are small and painless and do not require treatment. They can form in many areas of the body but are most common under the skin of the back, shoulders, arms, and thighs. You may need lipoma removal if you have a lipoma that is large, growing, or causing discomfort. Lipoma removal may also be done for cosmetic reasons. Tell a health care provider about:  Any allergies you have.  All medicines you are taking, including vitamins, herbs, eye drops, creams, and over-the-counter medicines.  Any problems you or family members have had with anesthetic medicines.  Any blood disorders you have.  Any surgeries you have had.  Any medical conditions you have.  Whether you are pregnant or may be pregnant. What are the risks? Generally, this is a safe procedure. However, problems  may occur, including:  Infection.  Bleeding.  Allergic reactions to medicines.  Damage to nerves or blood vessels near the lipoma.  Scarring.  What happens before the procedure? Staying hydrated Follow instructions from your health care provider about hydration, which may include:  Up to 2 hours before the procedure - you may continue to drink clear liquids, such as water, clear fruit juice, black coffee, and plain tea.  Eating and drinking restrictions Follow instructions from your health care provider about eating and drinking, which may include:  8 hours before the procedure - stop eating heavy meals or foods such as meat, fried foods, or fatty foods.  6 hours before the procedure - stop eating light meals or foods, such as toast or cereal.  6 hours before the procedure - stop drinking milk or drinks that contain milk.  2 hours before the procedure - stop drinking clear liquids.  Medicines  Ask your health care provider about: ? Changing or stopping your regular medicines. This is especially important if you are taking diabetes medicines or blood thinners. ? Taking medicines such as aspirin and ibuprofen. These medicines can thin your blood. Do not take these medicines before your procedure if your health care provider instructs you not to.  You may be given antibiotic medicine to help prevent infection. General instructions  Ask your health care provider how your surgical site will be marked or identified.  You will have a physical exam. Your health care provider will check the size of the lipoma and whether it can be moved easily.  You may have imaging tests, such as: ? X-rays. ? CT scan. ? MRI.  Plan to have someone take you home from the hospital or clinic. What happens during the procedure?  To reduce your risk of infection: ? Your health care team will wash or sanitize their hands. ? Your skin will be washed with soap.  You will be given one or more of  the following: ? A medicine to help you relax (sedative). ? A medicine to numb the area (local anesthetic). ? A medicine to make you fall asleep (general anesthetic). ? A medicine that is injected into an area of your body to numb everything below the injection site (regional anesthetic).  An incision will be made over the lipoma or very near the lipoma. The incision may be made in a natural skin line or crease.  Tissues, nerves, and blood vessels near the lipoma will be moved out of the way.  The lipoma and the capsule that surrounds it will be separated from  the surrounding tissues.  The lipoma will be removed.  The incision may be closed with stitches (sutures).  A bandage (dressing) will be placed over the incision. What happens after the procedure?  Do not drive for 24 hours if you received a sedative.  Your blood pressure, heart rate, breathing rate, and blood oxygen level will be monitored until the medicines you were given have worn off. This information is not intended to replace advice given to you by your health care provider. Make sure you discuss any questions you have with your health care provider. Document Released: 11/04/2015 Document Revised: 01/27/2016 Document Reviewed: 11/04/2015 Elsevier Interactive Patient Education  [employer].

## 2020-02-27 NOTE — Progress Notes (Signed)
Procedure  1.  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  Intermediate closure of 8 cm wound right hip 3.  Excision of 5 cm epidermal inclusion cyst right buttocks 4.  Intermediate closure of 6.5 cm wound right buttocks  Anesthesia: Lidocaine 1% with epinephrine total of 30 cc  EBL: 5 cc  Findings: Lipoma right hip and cyst right buttocks.  After informed consent was obtained patient was prepped and draped in the usual sterile fashion.  Local anesthetic was infiltrated and attention was turned to the right hip oblique incision was created.  Were able to identify the lipoma and excise it from the adjacent tissue using Metzenbaum scissors.  Hemostasis obtained with pressure.  The wound was closed in a 2 layer fashion with 3-0 Vicryl for the dermis and 4-0 Monocryl for the skin.  Attention then was turned to the right buttocks where elliptical incision was created with a 15 blade knife incorporating the chronic inflammatory changes attributed to the epidermal inclusion cyst. Were able to dissect the cyst using Metzenbaum scissors from adjacent structures.  The cyst was removed without any rupture.  Hemostasis was obtained with pressure.  The wound was closed in a 2 layer fashion with 3-0 Vicryl for the dermis and 4-0 Monocryl in a subcuticular fashion for the skin.  Both wounds were coated with Dermabond.  No immediate complications.  Specimens were sent to permanent pathology

## 2020-03-05 ENCOUNTER — Encounter: Payer: Self-pay | Admitting: Cardiology

## 2020-03-05 ENCOUNTER — Other Ambulatory Visit: Payer: Self-pay

## 2020-03-05 ENCOUNTER — Ambulatory Visit (INDEPENDENT_AMBULATORY_CARE_PROVIDER_SITE_OTHER): Payer: Medicare Other | Admitting: Cardiology

## 2020-03-05 VITALS — BP 150/70 | HR 69 | Ht 74.0 in | Wt 228.0 lb

## 2020-03-05 DIAGNOSIS — R0609 Other forms of dyspnea: Secondary | ICD-10-CM

## 2020-03-05 DIAGNOSIS — I10 Essential (primary) hypertension: Secondary | ICD-10-CM | POA: Diagnosis not present

## 2020-03-05 DIAGNOSIS — R0600 Dyspnea, unspecified: Secondary | ICD-10-CM | POA: Diagnosis not present

## 2020-03-05 DIAGNOSIS — E7800 Pure hypercholesterolemia, unspecified: Secondary | ICD-10-CM | POA: Diagnosis not present

## 2020-03-05 DIAGNOSIS — F17200 Nicotine dependence, unspecified, uncomplicated: Secondary | ICD-10-CM

## 2020-03-05 MED ORDER — LOSARTAN POTASSIUM 50 MG PO TABS
50.0000 mg | ORAL_TABLET | Freq: Two times a day (BID) | ORAL | 6 refills | Status: DC
Start: 2020-03-05 — End: 2020-05-07

## 2020-03-05 NOTE — Patient Instructions (Signed)
Medication Instructions:   Your physician has recommended you make the following change in your medication:   INCREASE your Losartan (Cozaar): Take 1 tablet (50 mg total) by mouth in the morning and at bedtime.   *If you need a refill on your cardiac medications before your next appointment, please call your pharmacy*   Lab Work: None Ordered If you have labs (blood work) drawn today and your tests are completely normal, you will receive your results only by: [name] Kitchen MyChart Message (if you have MyChart) OR . A paper copy in the mail If you have any lab test that is abnormal or we need to change your treatment, we will call you to review the results.   Testing/Procedures: None Ordered   Follow-Up: At [facility], you and your health needs are our priority.  As part of our continuing mission to provide you with exceptional heart care, we have created designated Provider Care Teams.  These Care Teams include your primary Cardiologist (physician) and Advanced Practice Providers (APPs -  Physician Assistants and Nurse Practitioners) who all work together to provide you with the care you need, when you need it.  We recommend signing up for the patient portal called "MyChart".  Sign up information is provided on this After Visit Summary.  MyChart is used to connect with patients for Virtual Visits (Telemedicine).  Patients are able to view lab/test results, encounter notes, upcoming appointments, etc.  Non-urgent messages can be sent to your provider as well.   To learn more about what you can do with MyChart, go to [email or web address].    Your next appointment:   2 month(s)  The format for your next appointment:   In Person  Provider:   Kate Sable, MD   Other Instructions N/A
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
## 2020-03-10 ENCOUNTER — Telehealth (INDEPENDENT_AMBULATORY_CARE_PROVIDER_SITE_OTHER): Payer: Self-pay | Admitting: Surgery

## 2020-03-10 ENCOUNTER — Other Ambulatory Visit: Payer: Self-pay

## 2020-03-10 DIAGNOSIS — Z09 Encounter for follow-up examination after completed treatment for conditions other than malignant neoplasm: Secondary | ICD-10-CM

## 2020-03-11 NOTE — Progress Notes (Signed)
Benjamin Armstrong is a 64 year old with excision of lipoma and epidermal inclusion cyst.  I placed a call on his cell phone.   He is doing well otherwise.  He reports some mild induration on the phalanx incision.  No fevers no chills pain is controlled. I will See him back next week to make sure there is no evidence of complications related to his procedure.

## 2020-03-17 ENCOUNTER — Encounter: Payer: Self-pay | Admitting: Surgery

## 2020-03-17 ENCOUNTER — Ambulatory Visit (INDEPENDENT_AMBULATORY_CARE_PROVIDER_SITE_OTHER): Payer: Self-pay | Admitting: Surgery

## 2020-03-17 ENCOUNTER — Other Ambulatory Visit: Payer: Self-pay

## 2020-03-17 VITALS — BP 148/86 | HR 76 | Temp 98.6°F | Resp 12 | Wt 229.2 lb

## 2020-03-17 DIAGNOSIS — Z09 Encounter for follow-up examination after completed treatment for conditions other than malignant neoplasm: Secondary | ICD-10-CM

## 2020-03-17 NOTE — Patient Instructions (Signed)
Follow up as needed, call the office if you have any questions or concerns.  

## 2020-03-18 NOTE — Progress Notes (Signed)
Benjamin Armstrong 64 year old male status post excision of 2 benign lesions.  Pathology discussed with him in detail.  He did have some ecchymosis at the incision site and apparently scant drainage.  He now feels better.  No fevers no chills.    Physical exam shows a male no acute distress.  Wound is healing well without evidence of infection or complication  A/p Doing well RTC prn

## 2020-03-30 ENCOUNTER — Telehealth: Payer: Self-pay | Admitting: Nurse Practitioner

## 2020-03-30 NOTE — Telephone Encounter (Signed)
VOICEMAIL NOT SET UP. Pt due to schedule Medicare Annual Wellness Visit (AWV)   This should be a telephone visit only=30 minutes.  No hx of AWV; please schedule at anytime with Denisa O'Brien-Blaney at [facility]

## 2020-04-05 ENCOUNTER — Ambulatory Visit (INDEPENDENT_AMBULATORY_CARE_PROVIDER_SITE_OTHER): Payer: Medicare Other

## 2020-04-05 VITALS — BP 187/93 | HR 86 | Ht 74.0 in | Wt 229.0 lb

## 2020-04-05 DIAGNOSIS — Z0000 Encounter for general adult medical examination without abnormal findings: Secondary | ICD-10-CM | POA: Diagnosis not present
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
## 2020-04-05 NOTE — Patient Instructions (Addendum)
Benjamin Armstrong , Thank you for taking time to come for your Medicare Wellness Visit. I appreciate your ongoing commitment to your health goals. Please review the following plan we discussed and let me know if I can assist you in the future.   These are the goals we discussed: Goals      Patient Stated   .  Blood Pressure < 140/90 (pt-stated)      Spot check blood pressure       This is a list of the screening recommended for you and due dates:  Health Maintenance  Topic Date Due  .  Hepatitis C: One time screening is recommended by Center for Disease Control  (CDC) for  adults born from [birth year] through 1967.   Never done  . COVID-19 Vaccine (1) 04/21/2020*  . Flu Shot  12/02/2020*  . Colon Cancer Screening  04/05/2021*  . Tetanus Vaccine  04/05/2021*  . Pneumonia vaccines (1 of 2 - PCV13) 04/05/2021*  *Topic was postponed. The date shown is not the original due date.    Immunizations  There is no immunization history on file for this patient.  Advanced directives: declined   Follow up in one year for your annual wellness visit.   Preventive Care 23 Years and Older, Male Preventive care refers to lifestyle choices and visits with your health care provider that can promote health and wellness. What does preventive care include?  A yearly physical exam. This is also called an annual well check.  Dental exams once or twice a year.  Routine eye exams. Ask your health care provider how often you should have your eyes checked.  Personal lifestyle choices, including:  Daily care of your teeth and gums.  Regular physical activity.  Eating a healthy diet.  Avoiding tobacco and drug use.  Limiting alcohol use.  Practicing safe sex.  Taking low doses of aspirin every day.  Taking vitamin and mineral supplements as recommended by your health care provider. What happens during an annual well check? The services and screenings done by your health care provider during your  annual well check will depend on your age, overall health, lifestyle risk factors, and family history of disease. Counseling  Your health care provider may ask you questions about your:  Alcohol use.  Tobacco use.  Drug use.  Emotional well-being.  Home and relationship well-being.  Sexual activity.  Eating habits.  History of falls.  Memory and ability to understand (cognition).  Work and work Statistician. Screening  You may have the following tests or measurements:  Height, weight, and BMI.  Blood pressure.  Lipid and cholesterol levels. These may be checked every 5 years, or more frequently if you are over 29 years old.  Skin check.  Lung cancer screening. You may have this screening every year starting at age 65 if you have a 30-pack-year history of smoking and currently smoke or have quit within the past 15 years.  Fecal occult blood test (FOBT) of the stool. You may have this test every year starting at age 28.  Flexible sigmoidoscopy or colonoscopy. You may have a sigmoidoscopy every 5 years or a colonoscopy every 10 years starting at age 47.  Prostate cancer screening. Recommendations will vary depending on your family history and other risks.  Hepatitis C blood test.  Hepatitis B blood test.  Sexually transmitted disease (STD) testing.  Diabetes screening. This is done by checking your blood sugar (glucose) after you have not eaten for a while (fasting).  You may have this done every 1-3 years.  Abdominal aortic aneurysm (AAA) screening. You may need this if you are a current or former smoker.  Osteoporosis. You may be screened starting at age 46 if you are at high risk. Talk with your health care provider about your test results, treatment options, and if necessary, the need for more tests. Vaccines  Your health care provider may recommend certain vaccines, such as:  Influenza vaccine. This is recommended every year.  Tetanus, diphtheria, and  acellular pertussis (Tdap, Td) vaccine. You may need a Td booster every 10 years.  Zoster vaccine. You may need this after age 50.  Pneumococcal 13-valent conjugate (PCV13) vaccine. One dose is recommended after age 97.  Pneumococcal polysaccharide (PPSV23) vaccine. One dose is recommended after age 30. Talk to your health care provider about which screenings and vaccines you need and how often you need them. This information is not intended to replace advice given to you by your health care provider. Make sure you discuss any questions you have with your health care provider. Document Released: 09/17/2015 Document Revised: 05/10/2016 Document Reviewed: 06/22/2015 Elsevier Interactive Patient Education  2017 [place] Prevention in the Home Falls can cause injuries. They can happen to people of all ages. There are many things you can do to make your home safe and to help prevent falls. What can I do on the outside of my home?  Regularly fix the edges of walkways and driveways and fix any cracks.  Remove anything that might make you trip as you walk through a door, such as a raised step or threshold.  Trim any bushes or trees on the path to your home.  Use bright outdoor lighting.  Clear any walking paths of anything that might make someone trip, such as rocks or tools.  Regularly check to see if handrails are loose or broken. Make sure that both sides of any steps have handrails.  Any raised decks and porches should have guardrails on the edges.  Have any leaves, snow, or ice cleared regularly.  Use sand or salt on walking paths during winter.  Clean up any spills in your garage right away. This includes oil or grease spills. What can I do in the bathroom?  Use night lights.  Install grab bars by the toilet and in the tub and shower. Do not use towel bars as grab bars.  Use non-skid mats or decals in the tub or shower.  If you need to sit down in the shower, use  a plastic, non-slip stool.  Keep the floor dry. Clean up any water that spills on the floor as soon as it happens.  Remove soap buildup in the tub or shower regularly.  Attach bath mats securely with double-sided non-slip rug tape.  Do not have throw rugs and other things on the floor that can make you trip. What can I do in the bedroom?  Use night lights.  Make sure that you have a light by your bed that is easy to reach.  Do not use any sheets or blankets that are too big for your bed. They should not hang down onto the floor.  Have a firm chair that has side arms. You can use this for support while you get dressed.  Do not have throw rugs and other things on the floor that can make you trip. What can I do in the kitchen?  Clean up any spills right away.  Avoid walking  on wet floors.  Keep items that you use a lot in easy-to-reach places.  If you need to reach something above you, use a strong step stool that has a grab bar.  Keep electrical cords out of the way.  Do not use floor polish or wax that makes floors slippery. If you must use wax, use non-skid floor wax.  Do not have throw rugs and other things on the floor that can make you trip. What can I do with my stairs?  Do not leave any items on the stairs.  Make sure that there are handrails on both sides of the stairs and use them. Fix handrails that are broken or loose. Make sure that handrails are as long as the stairways.  Check any carpeting to make sure that it is firmly attached to the stairs. Fix any carpet that is loose or worn.  Avoid having throw rugs at the top or bottom of the stairs. If you do have throw rugs, attach them to the floor with carpet tape.  Make sure that you have a light switch at the top of the stairs and the bottom of the stairs. If you do not have them, ask someone to add them for you. What else can I do to help prevent falls?  Wear shoes that:  Do not have high heels.  Have  rubber bottoms.  Are comfortable and fit you well.  Are closed at the toe. Do not wear sandals.  If you use a stepladder:  Make sure that it is fully opened. Do not climb a closed stepladder.  Make sure that both sides of the stepladder are locked into place.  Ask someone to hold it for you, if possible.  Clearly mark and make sure that you can see:  Any grab bars or handrails.  First and last steps.  Where the edge of each step is.  Use tools that help you move around (mobility aids) if they are needed. These include:  Canes.  Walkers.  Scooters.  Crutches.  Turn on the lights when you go into a dark area. Replace any light bulbs as soon as they burn out.  Set up your furniture so you have a clear path. Avoid moving your furniture around.  If any of your floors are uneven, fix them.  If there are any pets around you, be aware of where they are.  Review your medicines with your doctor. Some medicines can make you feel dizzy. This can increase your chance of falling. Ask your doctor what other things that you can do to help prevent falls. This information is not intended to replace advice given to you by your health care provider. Make sure you discuss any questions you have with your health care provider. Document Released: 06/17/2009 Document Revised: 01/27/2016 Document Reviewed: 09/25/2014 Elsevier Interactive Patient Education  2017 [employer].

## 2020-04-26 ENCOUNTER — Other Ambulatory Visit: Payer: Self-pay

## 2020-04-28 ENCOUNTER — Encounter: Payer: Self-pay | Admitting: Nurse Practitioner

## 2020-04-28 ENCOUNTER — Other Ambulatory Visit: Payer: Self-pay

## 2020-04-28 ENCOUNTER — Ambulatory Visit (INDEPENDENT_AMBULATORY_CARE_PROVIDER_SITE_OTHER): Payer: Medicare Other | Admitting: Nurse Practitioner

## 2020-04-28 VITALS — BP 118/62 | HR 87 | Temp 97.8°F | Ht 74.0 in | Wt 219.0 lb

## 2020-04-28 DIAGNOSIS — E785 Hyperlipidemia, unspecified: Secondary | ICD-10-CM | POA: Insufficient documentation

## 2020-04-28 DIAGNOSIS — Z1159 Encounter for screening for other viral diseases: Secondary | ICD-10-CM

## 2020-04-28 DIAGNOSIS — Z7189 Other specified counseling: Secondary | ICD-10-CM | POA: Diagnosis not present

## 2020-04-28 DIAGNOSIS — R600 Localized edema: Secondary | ICD-10-CM

## 2020-04-28 DIAGNOSIS — R079 Chest pain, unspecified: Secondary | ICD-10-CM

## 2020-04-28 DIAGNOSIS — Z720 Tobacco use: Secondary | ICD-10-CM

## 2020-04-28 DIAGNOSIS — Z1211 Encounter for screening for malignant neoplasm of colon: Secondary | ICD-10-CM

## 2020-04-28 DIAGNOSIS — I10 Essential (primary) hypertension: Secondary | ICD-10-CM | POA: Diagnosis not present

## 2020-04-28 DIAGNOSIS — Z125 Encounter for screening for malignant neoplasm of prostate: Secondary | ICD-10-CM

## 2020-04-28 DIAGNOSIS — R0609 Other forms of dyspnea: Secondary | ICD-10-CM

## 2020-04-28 DIAGNOSIS — R0600 Dyspnea, unspecified: Secondary | ICD-10-CM

## 2020-04-28 DIAGNOSIS — N6310 Unspecified lump in the right breast, unspecified quadrant: Secondary | ICD-10-CM
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
## 2020-04-28 NOTE — Patient Instructions (Addendum)
Recommend Covid Vaccine at your pharmacy and [facility] is now FDA approved.   Recommend tetanus vaccine every 10 years.  Recommend pneumonia vaccine, flu shot,  and shingles vaccines for adult health prevention.  Recommend starting on a cholesterol medicine - to decrease your risk of heart attack and stroke. Continue to work on diet and exercise.   Continue with the losartan 50 mg twice a day as your BP is great here today.  Please follow up office visit in 6 mos.   Preventive Care 66 Years and Older, Male Preventive care refers to lifestyle choices and visits with your health care provider that can promote health and wellness. This includes:  A yearly physical exam. This is also called an annual well check.  Regular dental and eye exams.  Immunizations.  Screening for certain conditions.  Healthy lifestyle choices, such as diet and exercise. What can I expect for my preventive care visit? Physical exam Your health care provider will check:  Height and weight. These may be used to calculate body mass index (BMI), which is a measurement that tells if you are at a healthy weight.  Heart rate and blood pressure.  Your skin for abnormal spots. Counseling Your health care provider may ask you questions about:  Alcohol, tobacco, and drug use.  Emotional well-being.  Home and relationship well-being.  Sexual activity.  Eating habits.  History of falls.  Memory and ability to understand (cognition).  Work and work Statistician. What immunizations do I need?  Influenza (flu) vaccine  This is recommended every year. Tetanus, diphtheria, and pertussis (Tdap) vaccine  You may need a Td booster every 10 years. Varicella (chickenpox) vaccine  You may need this vaccine if you have not already been vaccinated. Zoster (shingles) vaccine  You may need this after age 47. Pneumococcal conjugate (PCV13) vaccine  One dose is recommended after age 50. Pneumococcal  polysaccharide (PPSV23) vaccine  One dose is recommended after age 51. Measles, mumps, and rubella (MMR) vaccine  You may need at least one dose of MMR if you were born in 1957 or later. You may also need a second dose. Meningococcal conjugate (MenACWY) vaccine  You may need this if you have certain conditions. Hepatitis A vaccine  You may need this if you have certain conditions or if you travel or work in places where you may be exposed to hepatitis A. Hepatitis B vaccine  You may need this if you have certain conditions or if you travel or work in places where you may be exposed to hepatitis B. Haemophilus influenzae type b (Hib) vaccine  You may need this if you have certain conditions. You may receive vaccines as individual doses or as more than one vaccine together in one shot (combination vaccines). Talk with your health care provider about the risks and benefits of combination vaccines. What tests do I need? Blood tests  Lipid and cholesterol levels. These may be checked every 5 years, or more frequently depending on your overall health.  Hepatitis C test.  Hepatitis B test. Screening  Lung cancer screening. You may have this screening every year starting at age 53 if you have a 30-pack-year history of smoking and currently smoke or have quit within the past 15 years.  Colorectal cancer screening. All adults should have this screening starting at age 7 and continuing until age 33. Your health care provider may recommend screening at age 59 if you are at increased risk. You will have tests every 1-10 years, depending  on your results and the type of screening test.  Prostate cancer screening. Recommendations will vary depending on your family history and other risks.  Diabetes screening. This is done by checking your blood sugar (glucose) after you have not eaten for a while (fasting). You may have this done every 1-3 years.  Abdominal aortic aneurysm (AAA) screening. You  may need this if you are a current or former smoker.  Sexually transmitted disease (STD) testing. Follow these instructions at home: Eating and drinking  Eat a diet that includes fresh fruits and vegetables, whole grains, lean protein, and low-fat dairy products. Limit your intake of foods with high amounts of sugar, saturated fats, and salt.  Take vitamin and mineral supplements as recommended by your health care provider.  Do not drink alcohol if your health care provider tells you not to drink.  If you drink alcohol: ? Limit how much you have to 0-2 drinks a day. ? Be aware of how much alcohol is in your drink. In the [place] one drink equals one 12 oz bottle of beer (355 mL), one 5 oz glass of wine (148 mL), or one 1 oz glass of hard liquor (44 mL). Lifestyle  Take daily care of your teeth and gums.  Stay active. Exercise for at least 30 minutes on 5 or more days each week.  Do not use any products that contain nicotine or tobacco, such as cigarettes, e-cigarettes, and chewing tobacco. If you need help quitting, ask your health care provider.  If you are sexually active, practice safe sex. Use a condom or other form of protection to prevent STIs (sexually transmitted infections).  Talk with your health care provider about taking a low-dose aspirin or statin. What's next?  Visit your health care provider once a year for a well check visit.  Ask your health care provider how often you should have your eyes and teeth checked.  Stay up to date on all vaccines. This information is not intended to replace advice given to you by your health care provider. Make sure you discuss any questions you have with your health care provider. Document Revised: 08/15/2018 Document Reviewed: 08/15/2018 Elsevier Patient Education  2020 [employer].  Steps to Quit Smoking Smoking tobacco is the leading cause of preventable death. It can affect almost every organ in the body. Smoking puts you and  those around you at risk for developing many serious chronic diseases. Quitting smoking can be difficult, but it is one of the best things that you can do for your health. It is never too late to quit. How do I get ready to quit? When you decide to quit smoking, create a plan to help you succeed. Before you quit:  Pick a date to quit. Set a date within the next 2 weeks to give you time to prepare.  Write down the reasons why you are quitting. Keep this list in places where you will see it often.  Tell your family, friends, and co-workers that you are quitting. Support from your loved ones can make quitting easier.  Talk with your health care provider about your options for quitting smoking.  Find out what treatment options are covered by your health insurance.  Identify people, places, things, and activities that make you want to smoke (triggers). Avoid them. What first steps can I take to quit smoking?  Throw away all cigarettes at home, at work, and in your car.  Throw away smoking accessories, such as Scientist, research (medical).  Clean your car. Make sure to empty the ashtray.  Clean your home, including curtains and carpets. What strategies can I use to quit smoking? Talk with your health care provider about combining strategies, such as taking medicines while you are also receiving in-person counseling. Using these two strategies together makes you more likely to succeed in quitting than if you used either strategy on its own.  If you are pregnant or breastfeeding, talk with your health care provider about finding counseling or other support strategies to quit smoking. Do not take medicine to help you quit smoking unless your health care provider tells you to do so. To quit smoking: Quit right away  Quit smoking completely, instead of gradually reducing how much you smoke over a period of time. Research shows that stopping smoking right away is more successful than gradually  quitting.  Attend in-person counseling to help you build problem-solving skills. You are more likely to succeed in quitting if you attend counseling sessions regularly. Even short sessions of 10 minutes can be effective. Take medicine You may take medicines to help you quit smoking. Some medicines require a prescription and some you can purchase over-the-counter. Medicines may have nicotine in them to replace the nicotine in cigarettes. Medicines may:  Help to stop cravings.  Help to relieve withdrawal symptoms. Your health care provider may recommend:  Nicotine patches, gum, or lozenges.  Nicotine inhalers or sprays.  Non-nicotine medicine that is taken by mouth. Find resources Find resources and support systems that can help you to quit smoking and remain smoke-free after you quit. These resources are most helpful when you use them often. They include:  Online chats with a Social worker.  Telephone quitlines.  Printed Furniture conservator/restorer.  Support groups or group counseling.  Text messaging programs.  Mobile phone apps or applications. Use apps that can help you stick to your quit plan by providing reminders, tips, and encouragement. There are many free apps for mobile devices as well as websites. Examples include Quit Guide from the [employer] and smokefree.gov What things can I do to make it easier to quit?   Reach out to your family and friends for support and encouragement. Call telephone quitlines (1-800-QUIT-NOW), reach out to support groups, or work with a counselor for support.  Ask people who smoke to avoid smoking around you.  Avoid places that trigger you to smoke, such as bars, parties, or smoke-break areas at work.  Spend time with people who do not smoke.  Lessen the stress in your life. Stress can be a smoking trigger for some people. To lessen stress, try: ? Exercising regularly. ? Doing deep-breathing exercises. ? Doing yoga. ? Meditating. ? Performing a body  scan. This involves closing your eyes, scanning your body from head to toe, and noticing which parts of your body are particularly tense. Try to relax the muscles in those areas. How will I feel when I quit smoking? Day 1 to 3 weeks Within the first 24 hours of quitting smoking, you may start to feel withdrawal symptoms. These symptoms are usually most noticeable 2-3 days after quitting, but they usually do not last for more than 2-3 weeks. You may experience these symptoms:  Mood swings.  Restlessness, anxiety, or irritability.  Trouble concentrating.  Dizziness.  Strong cravings for sugary foods and nicotine.  Mild weight gain.  Constipation.  Nausea.  Coughing or a sore throat.  Changes in how the medicines that you take for unrelated issues work in your body.  Depression.  Trouble sleeping (insomnia). Week 3 and afterward After the first 2-3 weeks of quitting, you may start to notice more positive results, such as:  Improved sense of smell and taste.  Decreased coughing and sore throat.  Slower heart rate.  Lower blood pressure.  Clearer skin.  The ability to breathe more easily.  Fewer sick days. Quitting smoking can be very challenging. Do not get discouraged if you are not successful the first time. Some people need to make many attempts to quit before they achieve long-term success. Do your best to stick to your quit plan, and talk with your health care provider if you have any questions or concerns. Summary  Smoking tobacco is the leading cause of preventable death. Quitting smoking is one of the best things that you can do for your health.  When you decide to quit smoking, create a plan to help you succeed.  Quit smoking right away, not slowly over a period of time.  When you start quitting, seek help from your health care provider, family, or friends. This information is not intended to replace advice given to you by your health care provider. Make  sure you discuss any questions you have with your health care provider. Document Revised: 05/16/2019 Document Reviewed: 11/09/2018 Elsevier Patient Education  [place].  Preventing High Cholesterol Cholesterol is a white, waxy substance similar to fat that the human body needs to help build cells. The liver makes all the cholesterol that a person's body needs. Having high cholesterol (hypercholesterolemia) increases a person's risk for heart disease and stroke. Extra (excess) cholesterol comes from the food the person eats. High cholesterol can often be prevented with diet and lifestyle changes. If you already have high cholesterol, you can control it with diet and lifestyle changes and with medicine. How can high cholesterol affect me? If you have high cholesterol, deposits (plaques) may build up on the walls of your arteries. The arteries are the blood vessels that carry blood away from your heart. Plaques make the arteries narrower and stiffer. This can limit or block blood flow and cause blood clots to form. Blood clots:  Are tiny balls of cells that form in your blood.  Can move to the heart or brain, causing a heart attack or stroke. Plaques in arteries greatly increase your risk for heart attack and stroke.Making diet and lifestyle changes can reduce your risk for these conditions that may threaten your life. What can increase my risk? This condition is more likely to develop in people who:  Eat foods that are high in saturated fat or cholesterol. Saturated fat is mostly found in: ? Foods that contain animal fat, such as red meat and some dairy products. ? Certain fatty foods made from plants, such as tropical oils.  Are overweight.  Are not getting enough exercise.  Have a family history of high cholesterol. What actions can I take to prevent this? Nutrition   Eat less saturated fat.  Avoid trans fats (partially hydrogenated oils). These are often found in margarine  and in some baked goods, fried foods, and snacks bought in packages.  Avoid precooked or cured meat, such as sausages or meat loaves.  Avoid foods and drinks that have added sugars.  Eat more fruits, vegetables, and whole grains.  Choose healthy sources of protein, such as fish, poultry, lean cuts of red meat, beans, peas, lentils, and nuts.  Choose healthy sources of fat, such as: ? Nuts. ? Vegetable oils, especially olive  oil. ? Fish that have healthy fats (omega-3 fatty acids), such as mackerel or salmon. The items listed above may not be a complete list of recommended foods and beverages. Contact a dietitian for more information. Lifestyle  Lose weight if you are overweight. Losing 5-10 lb (2.3-4.5 kg) can help prevent or control high cholesterol. It can also lower your risk for diabetes and high blood pressure. Ask your health care provider to help you with a diet and exercise plan to lose weight safely.  Do not use any products that contain nicotine or tobacco, such as cigarettes, e-cigarettes, and chewing tobacco. If you need help quitting, ask your health care provider.  Limit your alcohol intake. ? Do not drink alcohol if:  Your health care provider tells you not to drink.  You are pregnant, may be pregnant, or are planning to become pregnant. ? If you drink alcohol:  Limit how much you use to:  0-1 drink a day for women.  0-2 drinks a day for men.  Be aware of how much alcohol is in your drink. In the [place] one drink equals one 12 oz bottle of beer (355 mL), one 5 oz glass of wine (148 mL), or one 1 oz glass of hard liquor (44 mL). Activity   Get enough exercise. Each week, do at least 150 minutes of exercise that takes a medium level of effort (moderate-intensity exercise). ? This is exercise that:  Makes your heart beat faster and makes you breathe harder than usual.  Allows you to still be able to talk. ? You could exercise in short sessions several times a  day or longer sessions a few times a week. For example, on 5 days each week, you could walk fast or ride your bike 3 times a day for 10 minutes each time.  Do exercises as told by your health care provider. Medicines  In addition to diet and lifestyle changes, your health care provider may recommend medicines to help lower cholesterol. This may be a medicine to lower the amount of cholesterol your liver makes. You may need medicine if: ? Diet and lifestyle changes do not lower your cholesterol enough. ? You have high cholesterol and other risk factors for heart disease or stroke.  Take over-the-counter and prescription medicines only as told by your health care provider. General information  Manage your risk factors for high cholesterol. Talk with your health care provider about all your risk factors and how to lower your risk.  Manage other conditions that you have, such as diabetes or high blood pressure (hypertension).  Have blood tests to check your cholesterol levels at regular points in time as told by your health care provider.  Keep all follow-up visits as told by your health care provider. This is important. Where to find more information  American Heart Association: www.heart.org  National Heart, Lung, and Blood Institute: https://wilson-eaton.com/ Summary  High cholesterol increases your risk for heart disease and stroke. By keeping your cholesterol level low, you can reduce your risk for these conditions.  High cholesterol can often be prevented with diet and lifestyle changes.  Work with your health care provider to manage your risk factors, and have your blood tested regularly. This information is not intended to replace advice given to you by your health care provider. Make sure you discuss any questions you have with your health care provider. Document Revised: 12/13/2018 Document Reviewed: 04/29/2016 Elsevier Patient Education  2020 [employer].

## 2020-04-29 NOTE — Assessment & Plan Note (Addendum)
Cont current Losartan 50 mg twice daily new start in July 2021.  He needs f/up  renal labs - extremely fearful of needles- but was told necessary labs on this medication soon and then at least 2-3 time per year.

## 2020-05-01 ENCOUNTER — Encounter: Payer: Self-pay | Admitting: Nurse Practitioner

## 2020-05-01 NOTE — Assessment & Plan Note (Signed)
Attributed to mask wearing and smoking. Cardiac w/up with ECHO and stress test unrevealing.

## 2020-05-01 NOTE — Assessment & Plan Note (Signed)
Agrees to do Cologuard- ordered.

## 2020-05-01 NOTE — Assessment & Plan Note (Signed)
Smoking cessation discussed gain- he does not want materials.

## 2020-05-01 NOTE — Assessment & Plan Note (Addendum)
Mammo- benign. Positive  gynecomastia. No further breast pain.  He continues to decline a ENDO referral. Monitor. 5/12-24/2021:  Testosterone: LOW 125 LH: 5.9 normal Estradiol: 31 normal HCG: <1 normal Prolactin: 5.6 normal

## 2020-05-01 NOTE — Assessment & Plan Note (Signed)
Still has slight ankle edema- reports chronic and goes down in the am. Not bothering him. Will not wear compression knee highs. Consider potassium sparing  diuretic if needed for more BP control as he doesn't like to come in for labs.

## 2020-05-01 NOTE — Assessment & Plan Note (Signed)
Discussed the benefits of statin therapy to reduce CVD risk and future MI CVA. He declines and will improve his diet.

## 2020-05-01 NOTE — Assessment & Plan Note (Addendum)
Resolved . Reports no further CP after starting Losartan. Cardiac w/up with ECHO and stress test unrevealing.

## 2020-05-07 ENCOUNTER — Ambulatory Visit (INDEPENDENT_AMBULATORY_CARE_PROVIDER_SITE_OTHER): Payer: Medicare Other | Admitting: Cardiology

## 2020-05-07 ENCOUNTER — Encounter: Payer: Self-pay | Admitting: Cardiology

## 2020-05-07 ENCOUNTER — Other Ambulatory Visit: Payer: Self-pay

## 2020-05-07 VITALS — BP 140/72 | HR 67 | Ht 74.0 in | Wt 223.0 lb

## 2020-05-07 DIAGNOSIS — I10 Essential (primary) hypertension: Secondary | ICD-10-CM | POA: Diagnosis not present

## 2020-05-07 DIAGNOSIS — R0602 Shortness of breath: Secondary | ICD-10-CM

## 2020-05-07 DIAGNOSIS — F17200 Nicotine dependence, unspecified, uncomplicated: Secondary | ICD-10-CM | POA: Diagnosis not present

## 2020-05-07 DIAGNOSIS — E7800 Pure hypercholesterolemia, unspecified: Secondary | ICD-10-CM

## 2020-05-07 MED ORDER — HYDROCHLOROTHIAZIDE 25 MG PO TABS
25.0000 mg | ORAL_TABLET | Freq: Every day | ORAL | 2 refills | Status: DC
Start: 2020-05-07 — End: 2020-09-13

## 2020-05-07 MED ORDER — LOSARTAN POTASSIUM 100 MG PO TABS: 100.0000 mg | ORAL_TABLET | Freq: Every day | ORAL | 2 refills | Status: DC
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
## 2020-05-07 NOTE — Patient Instructions (Signed)
Medication Instructions:  Your physician has recommended you make the following change in your medication:   INCREASE Losartan to 100 mg daily.  START HCTZ 25 mg daily. An Rx has been sent to your pharmacy.  *If you need a refill on your cardiac medications before your next appointment, please call your pharmacy*   Lab Work: None ordered If you have labs (blood work) drawn today and your tests are completely normal, you will receive your results only by: [name] Kitchen MyChart Message (if you have MyChart) OR . A paper copy in the mail If you have any lab test that is abnormal or we need to change your treatment, we will call you to review the results.   Testing/Procedures: None ordered   Follow-Up: At [facility], you and your health needs are our priority.  As part of our continuing mission to provide you with exceptional heart care, we have created designated Provider Care Teams.  These Care Teams include your primary Cardiologist (physician) and Advanced Practice Providers (APPs -  Physician Assistants and Nurse Practitioners) who all work together to provide you with the care you need, when you need it.  We recommend signing up for the patient portal called "MyChart".  Sign up information is provided on this After Visit Summary.  MyChart is used to connect with patients for Virtual Visits (Telemedicine).  Patients are able to view lab/test results, encounter notes, upcoming appointments, etc.  Non-urgent messages can be sent to your provider as well.   To learn more about what you can do with MyChart, go to [email or web address].    Your next appointment:   3 month(s)  The format for your next appointment:   In Person  Provider:    You may see Kate Sable, MD or one of the following Advanced Practice Providers on your designated Care Team:    Murray Hodgkins, NP  Christell Faith, PA-C  Marrianne Mood, PA-C    Other Instructions N/A

## 2020-05-11 ENCOUNTER — Telehealth: Payer: Self-pay | Admitting: Nurse Practitioner

## 2020-05-11 NOTE — Telephone Encounter (Signed)
Please set up a f/up lab appt week of Sept 20 as his cardiologist increased his losartan to 200 mg daily and started HCTZ 25 mg daily on  05/07/2020 visit.   PLAN:   Jessica:  1. Please arrange  lab appt week of Sept 20, His labs are already in system as future.   2. He also needs a nurse visit for a BP check at that time.    3. He needs an OV with me in end November. Thanks

## 2020-05-11 NOTE — Telephone Encounter (Signed)
Patient scheduled for labs on 05/24/20 and f/u appt on 07/30/20

## 2020-05-12 NOTE — Telephone Encounter (Signed)
Can you get a nurse visit and check his BP when he comes in for labs on 9/20?

## 2020-05-17 NOTE — Telephone Encounter (Signed)
Sure, BP checks should be 30 minutes just incase but we can double book if needed.

## 2020-05-17 NOTE — Telephone Encounter (Signed)
Patient's lab appointment is on a Monday, 05/24/20  I need a ok to schedule on nv schedule.

## 2020-05-24 ENCOUNTER — Other Ambulatory Visit: Payer: Self-pay

## 2020-05-24 ENCOUNTER — Ambulatory Visit (INDEPENDENT_AMBULATORY_CARE_PROVIDER_SITE_OTHER): Payer: Medicare Other

## 2020-05-24 ENCOUNTER — Other Ambulatory Visit (INDEPENDENT_AMBULATORY_CARE_PROVIDER_SITE_OTHER): Payer: Medicare Other

## 2020-05-24 VITALS — BP 134/79 | HR 61

## 2020-05-24 DIAGNOSIS — Z1159 Encounter for screening for other viral diseases: Secondary | ICD-10-CM

## 2020-05-24 DIAGNOSIS — I10 Essential (primary) hypertension: Secondary | ICD-10-CM

## 2020-05-24 DIAGNOSIS — E785 Hyperlipidemia, unspecified: Secondary | ICD-10-CM

## 2020-05-24 LAB — COMPREHENSIVE METABOLIC PANEL
ALT: 18 U/L (ref 0–53)
AST: 13 U/L (ref 0–37)
Albumin: 4.2 g/dL (ref 3.5–5.2)
Alkaline Phosphatase: 53 U/L (ref 39–117)
BUN: 16 mg/dL (ref 6–23)
CO2: 27 mEq/L (ref 19–32)
Calcium: 9.5 mg/dL (ref 8.4–10.5)
Chloride: 102 mEq/L (ref 96–112)
Creatinine, Ser: 0.93 mg/dL (ref 0.40–1.50)
GFR: 81.25 mL/min (ref >60.00)
Glucose, Bld: 106 mg/dL — ABNORMAL HIGH (ref 70–99)
Potassium: 4.4 mEq/L (ref 3.5–5.1)
Sodium: 137 mEq/L (ref 135–145)
Total Bilirubin: 0.5 mg/dL (ref 0.2–1.2)
Total Protein: 6.6 g/dL (ref 6.0–8.3)

## 2020-05-24 LAB — LIPID PANEL
Cholesterol: 241 mg/dL — ABNORMAL HIGH (ref 0–200)
HDL: 39.3 mg/dL (ref >39.00)
LDL Cholesterol: 167 mg/dL — ABNORMAL HIGH (ref 0–99)
NonHDL: 202.17
Total CHOL/HDL Ratio: 6
Triglycerides: 174 mg/dL — ABNORMAL HIGH (ref 0.0–149.0)
VLDL: 34.8 mg/dL (ref 0.0–40.0)

## 2020-05-24 LAB — CBC WITH DIFFERENTIAL/PLATELET
Basophils Absolute: 0.1 10*3/uL (ref 0.0–0.1)
Basophils Relative: 0.9 % (ref 0.0–3.0)
Eosinophils Absolute: 0.4 10*3/uL (ref 0.0–0.7)
Eosinophils Relative: 3.8 % (ref 0.0–5.0)
HCT: 46.8 % (ref 39.0–52.0)
Hemoglobin: 15.8 g/dL (ref 13.0–17.0)
Lymphocytes Relative: 24.1 % (ref 12.0–46.0)
Lymphs Abs: 2.4 10*3/uL (ref 0.7–4.0)
MCHC: 33.7 g/dL (ref 30.0–36.0)
MCV: 90.1 fl (ref 78.0–100.0)
Monocytes Absolute: 0.9 10*3/uL (ref 0.1–1.0)
Monocytes Relative: 9.4 % (ref 3.0–12.0)
Neutro Abs: 6.1 10*3/uL (ref 1.4–7.7)
Neutrophils Relative %: 61.8 % (ref 43.0–77.0)
Platelets: 197 10*3/uL (ref 150.0–400.0)
RBC: 5.19 Mil/uL (ref 4.22–5.81)
RDW: 14 % (ref 11.5–15.5)
WBC: 9.9 10*3/uL (ref 4.0–10.5)

## 2020-05-24 NOTE — Progress Notes (Addendum)
Patient is here for a BP check due to bp being high at last visit, as per patient.  Currently patients BP is 134/79 and BPM is 61.  Patient has no complaints of headaches, blurry vision, chest pain, arm pain, light headedness, dizziness, and nor jaw pain. Please see previous note for order.   Reviewed blood pressure.  No changes made.  Appears seeing cardiology and change made recently (per phone note).  Continue current medication and continue to monitor blood pressure.    Dr Nicki Reaper

## 2020-05-25 LAB — HEPATITIS C ANTIBODY
Hepatitis C Ab: NONREACTIVE
SIGNAL TO CUT-OFF: 0.01 (ref <1.00)

## 2020-05-25 LAB — COLOGUARD: Cologuard: NEGATIVE

## 2020-05-27 MED ORDER — ROSUVASTATIN CALCIUM 10 MG PO TABS: ORAL_TABLET | ORAL | 1 refills | Status: DC

## 2020-05-27 NOTE — Addendum Note (Signed)
Addended by: Denice Paradise A on: 05/27/2020 02:47 PM   Modules accepted: Orders

## 2020-05-28 LAB — COLOGUARD: COLOGUARD: NEGATIVE

## 2020-06-02 ENCOUNTER — Telehealth: Payer: Self-pay | Admitting: Nurse Practitioner

## 2020-06-02 NOTE — Telephone Encounter (Signed)
Pt said he started rosuvastatin yesterday and since then he has noticed a blockage when trying to go to bathroom. Pt said he is not taking fluid pill and work. It is just not possible. He would like a call back. He also said he hasn't heard from cologuard test.

## 2020-06-03 NOTE — Telephone Encounter (Signed)
Patient did not have any other sx other then they not void hardly any yesterday and yes its back to normal today. Does not want to go to urology right now since he believes its the medication. I advised him that it should not have done this after one dose and he is only taking medication 3x a week. Advised to continue taking it as prescribed and let us know next week if the issue continues. Patient agrees to this.

## 2020-06-03 NOTE — Telephone Encounter (Signed)
Agree that he should try it again. Can you call him the end of next week to check on his response?

## 2020-06-03 NOTE — Telephone Encounter (Signed)
Patient took his crestor for the first time Tuesday afternoon and states she had trouble urinating yesterday. States he felt there was a blockage to where he could not go and now he does not want to take this medication if he's not going to be able to go to the bathroom. He had refused the HCTZ that cardio gave him because he doesn't want to constantly go to the bathroom. He said he drank a lot of fluids yesterday and should have been able to go to the bathroom a lot and never did so he was very concerned. Also, I pulled his cologuard and it was negative so I will let him know when I call him back.

## 2020-06-03 NOTE — Telephone Encounter (Signed)
It is unlikely that one Crestor caused him not to be able to void.  He needs his prostate checked- I recommend a Urology referral. If he agrees- I will place referral.    Is he voiding OK today?  Any signs of UTI- blood, dysuria, burning, frequency? Any back pain or hx of kidney stones? If yes, then I would like him to come in for urine dip, UA w micro and urine culture.

## 2020-06-11 NOTE — Telephone Encounter (Signed)
Spoke with patient and he is doing well; no more issues with the crestor

## 2020-07-30 ENCOUNTER — Ambulatory Visit: Payer: Medicare Other | Admitting: Nurse Practitioner

## 2020-07-30 ENCOUNTER — Other Ambulatory Visit: Payer: Medicare Other

## 2020-09-13 ENCOUNTER — Other Ambulatory Visit: Payer: Self-pay

## 2020-09-13 ENCOUNTER — Encounter: Payer: Self-pay | Admitting: Cardiology

## 2020-09-13 ENCOUNTER — Ambulatory Visit (INDEPENDENT_AMBULATORY_CARE_PROVIDER_SITE_OTHER): Payer: Medicare Other | Admitting: Cardiology

## 2020-09-13 VITALS — BP 140/80 | HR 68 | Ht 74.0 in | Wt 237.0 lb

## 2020-09-13 DIAGNOSIS — E7800 Pure hypercholesterolemia, unspecified: Secondary | ICD-10-CM | POA: Diagnosis not present

## 2020-09-13 DIAGNOSIS — R0602 Shortness of breath: Secondary | ICD-10-CM | POA: Diagnosis not present

## 2020-09-13 DIAGNOSIS — I10 Essential (primary) hypertension: Secondary | ICD-10-CM | POA: Diagnosis not present

## 2020-09-13 DIAGNOSIS — F17200 Nicotine dependence, unspecified, uncomplicated: Secondary | ICD-10-CM

## 2020-09-13 NOTE — Patient Instructions (Addendum)
Medication Instructions:  Your physician recommends that you continue on your current medications as directed. Please refer to the Current Medication list given to you today.  *If you need a refill on your cardiac medications before your next appointment, please call your pharmacy*   Lab Work:  Your physician recommends that you return for a FASTING lipid profile: the week prior to your 6 month follow up.  - You will need to be fasting. Please do not have anything to eat or drink after midnight the morning you have the lab work. You may only have water or black coffee with no cream or sugar.- Please go to the [facility]. You will check in at the front desk to the right as you walk into the atrium. Valet Parking is offered if needed. - No appointment needed. You may go any day between 7 am and 6 pm.   Testing/Procedures: None Ordered   Follow-Up: At [facility], you and your health needs are our priority.  As part of our continuing mission to provide you with exceptional heart care, we have created designated Provider Care Teams.  These Care Teams include your primary Cardiologist (physician) and Advanced Practice Providers (APPs -  Physician Assistants and Nurse Practitioners) who all work together to provide you with the care you need, when you need it.  We recommend signing up for the patient portal called "MyChart".  Sign up information is provided on this After Visit Summary.  MyChart is used to connect with patients for Virtual Visits (Telemedicine).  Patients are able to view lab/test results, encounter notes, upcoming appointments, etc.  Non-urgent messages can be sent to your provider as well.   To learn more about what you can do with MyChart, go to [email or web address].    Your next appointment:   6 month(s)  The format for your next appointment:   In Person  Provider:   Kate Sable, MD   Other Instructions
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
## 2020-11-11 ENCOUNTER — Telehealth: Payer: Self-pay | Admitting: *Deleted

## 2020-11-11 MED ORDER — IRBESARTAN 300 MG PO TABS
300.0000 mg | ORAL_TABLET | Freq: Every day | ORAL | 3 refills | Status: DC
Start: 2020-11-11 — End: 2020-11-19

## 2020-11-11 NOTE — Telephone Encounter (Signed)
Received fax from [facility] about Losartan 100 mg being on backorder and requesting an alternate medication be sent to pharmacy. Please advise and send updated prescription to the pharmacy on file. Thank you.

## 2020-11-11 NOTE — Telephone Encounter (Signed)
Sent in a prescription for Irbesartan 300 MG once daily in replace of the Losartan per Dr. Garen Lah.   Called patient and informed him of the change. Patient was appreciative of the phone call.

## 2020-11-16 ENCOUNTER — Telehealth: Payer: Self-pay | Admitting: Nurse Practitioner

## 2020-11-16 NOTE — Telephone Encounter (Signed)
Called patient scheduled and acute visit with dr. Aundra Dubin  For leg cramps .

## 2020-11-16 NOTE — Telephone Encounter (Signed)
Patient called and set up a TOC with [facility] on 01/17/2021. He is requesting a refill on his rosuvastatin (CRESTOR) 10 MG tablet. Patient is wondering if this can be increased because he is having leg cramps, and other places, not at the same time.

## 2020-11-16 NOTE — Telephone Encounter (Signed)
If he is having persistent leg cramps, I would recommend evaluation and see if can confirm etiology of cramps (for example electrolyte abnormality, magnesium def, claudication, etc).  Crestor can cause muscle aches, but given his calculated cholesterol risk, it is recommended for him to be on a statin if possible, so need to try and confirm if another cause for the cramps.  Does any one have a spot to work in for leg cramps.

## 2020-11-16 NOTE — Telephone Encounter (Signed)
Patient continuing to have cramps in legs advised patient Crestor does not relieve cramps it is for cholesterol, patient takes on alternating days ok to stop[ or should he continue til he sees New NP.

## 2020-11-19 ENCOUNTER — Ambulatory Visit (INDEPENDENT_AMBULATORY_CARE_PROVIDER_SITE_OTHER): Payer: Medicare Other | Admitting: Internal Medicine

## 2020-11-19 ENCOUNTER — Other Ambulatory Visit: Payer: Self-pay

## 2020-11-19 ENCOUNTER — Encounter: Payer: Self-pay | Admitting: Internal Medicine

## 2020-11-19 VITALS — BP 116/60 | HR 79 | Temp 98.3°F | Ht 74.0 in | Wt 238.8 lb

## 2020-11-19 DIAGNOSIS — R7303 Prediabetes: Secondary | ICD-10-CM | POA: Diagnosis not present

## 2020-11-19 DIAGNOSIS — T148XXA Other injury of unspecified body region, initial encounter: Secondary | ICD-10-CM | POA: Diagnosis not present

## 2020-11-19 DIAGNOSIS — M62838 Other muscle spasm: Secondary | ICD-10-CM

## 2020-11-19 DIAGNOSIS — I152 Hypertension secondary to endocrine disorders: Secondary | ICD-10-CM

## 2020-11-19 DIAGNOSIS — R252 Cramp and spasm: Secondary | ICD-10-CM

## 2020-11-19 DIAGNOSIS — E785 Hyperlipidemia, unspecified: Secondary | ICD-10-CM

## 2020-11-19 DIAGNOSIS — Z125 Encounter for screening for malignant neoplasm of prostate: Secondary | ICD-10-CM

## 2020-11-19 DIAGNOSIS — I10 Essential (primary) hypertension: Secondary | ICD-10-CM

## 2020-11-19 DIAGNOSIS — E1159 Type 2 diabetes mellitus with other circulatory complications: Secondary | ICD-10-CM

## 2020-11-19 LAB — LIPID PANEL
Cholesterol: 151 mg/dL (ref 0–200)
HDL: 36.3 mg/dL — ABNORMAL LOW (ref >39.00)
NonHDL: 114.5
Total CHOL/HDL Ratio: 4
Triglycerides: 225 mg/dL — ABNORMAL HIGH (ref 0.0–149.0)
VLDL: 45 mg/dL — ABNORMAL HIGH (ref 0.0–40.0)

## 2020-11-19 LAB — COMPREHENSIVE METABOLIC PANEL
ALT: 19 U/L (ref 0–53)
AST: 18 U/L (ref 0–37)
Albumin: 3.8 g/dL (ref 3.5–5.2)
Alkaline Phosphatase: 56 U/L (ref 39–117)
BUN: 14 mg/dL (ref 6–23)
CO2: 28 mEq/L (ref 19–32)
Calcium: 9.2 mg/dL (ref 8.4–10.5)
Chloride: 103 mEq/L (ref 96–112)
Creatinine, Ser: 1.02 mg/dL (ref 0.40–1.50)
GFR: 76.5 mL/min (ref >60.00)
Glucose, Bld: 121 mg/dL — ABNORMAL HIGH (ref 70–99)
Potassium: 4.3 mEq/L (ref 3.5–5.1)
Sodium: 139 mEq/L (ref 135–145)
Total Bilirubin: 0.4 mg/dL (ref 0.2–1.2)
Total Protein: 6.2 g/dL (ref 6.0–8.3)

## 2020-11-19 LAB — CBC WITH DIFFERENTIAL/PLATELET
Basophils Absolute: 0.1 10*3/uL (ref 0.0–0.1)
Basophils Relative: 0.9 % (ref 0.0–3.0)
Eosinophils Absolute: 0.4 10*3/uL (ref 0.0–0.7)
Eosinophils Relative: 4.3 % (ref 0.0–5.0)
HCT: 46.8 % (ref 39.0–52.0)
Hemoglobin: 15.9 g/dL (ref 13.0–17.0)
Lymphocytes Relative: 24.5 % (ref 12.0–46.0)
Lymphs Abs: 2.2 10*3/uL (ref 0.7–4.0)
MCHC: 33.9 g/dL (ref 30.0–36.0)
MCV: 89.4 fl (ref 78.0–100.0)
Monocytes Absolute: 0.8 10*3/uL (ref 0.1–1.0)
Monocytes Relative: 9.2 % (ref 3.0–12.0)
Neutro Abs: 5.5 10*3/uL (ref 1.4–7.7)
Neutrophils Relative %: 61.1 % (ref 43.0–77.0)
Platelets: 178 10*3/uL (ref 150.0–400.0)
RBC: 5.24 Mil/uL (ref 4.22–5.81)
RDW: 13.6 % (ref 11.5–15.5)
WBC: 9.1 10*3/uL (ref 4.0–10.5)

## 2020-11-19 LAB — PSA, MEDICARE: PSA: 0.64 ng/ml (ref 0.10–4.00)

## 2020-11-19 LAB — LDL CHOLESTEROL, DIRECT: Direct LDL: 96 mg/dL

## 2020-11-19 LAB — HEMOGLOBIN A1C: Hgb A1c MFr Bld: 6.5 % (ref 4.6–6.5)

## 2020-11-19 LAB — TSH: TSH: 1.71 u[IU]/mL (ref 0.35–4.50)

## 2020-11-19 LAB — CK: Total CK: 175 U/L (ref 7–232)

## 2020-11-19 LAB — MAGNESIUM: Magnesium: 2.1 mg/dL (ref 1.5–2.5)

## 2020-11-19 MED ORDER — IRBESARTAN 300 MG PO TABS: 300.0000 mg | ORAL_TABLET | Freq: Every day | ORAL | 3 refills | Status: DC

## 2020-11-19 MED ORDER — MUPIROCIN 2 % EX OINT: 1.0000 "application " | TOPICAL_OINTMENT | Freq: Three times a day (TID) | CUTANEOUS | 0 refills | Status: DC

## 2020-11-19 MED ORDER — EPINEPHRINE 0.3 MG/0.3ML IJ SOAJ: 0.3000 mg | INTRAMUSCULAR | 1 refills | Status: DC | PRN
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
## 2020-11-19 NOTE — Patient Instructions (Addendum)
theraworx spray for cramps as needed  More water 55-64 ounces   Debrox ear wax drops   High Cholesterol  High cholesterol is a condition in which the blood has high levels of a white, waxy substance similar to fat (cholesterol). The liver makes all the cholesterol that the body needs. The human body needs small amounts of cholesterol to help build cells. A person gets extra or excess cholesterol from the food that he or she eats. The blood carries cholesterol from the liver to the rest of the body. If you have high cholesterol, deposits (plaques) may build up on the walls of your arteries. Arteries are the blood vessels that carry blood away from your heart. These plaques make the arteries narrow and stiff. Cholesterol plaques increase your risk for heart attack and stroke. Work with your health care provider to keep your cholesterol levels in a healthy range. What increases the risk? The following factors may make you more likely to develop this condition:  Eating foods that are high in animal fat (saturated fat) or cholesterol.  Being overweight.  Not getting enough exercise.  A family history of high cholesterol (familial hypercholesterolemia).  Use of tobacco products.  Having diabetes. What are the signs or symptoms? There are no symptoms of this condition. How is this diagnosed? This condition may be diagnosed based on the results of a blood test.  If you are older than 67 years of age, your health care provider may check your cholesterol levels every 4-6 years.  You may be checked more often if you have high cholesterol or other risk factors for heart disease. The blood test for cholesterol measures:  "Bad" cholesterol, or LDL cholesterol. This is the main type of cholesterol that causes heart disease. The desired level is less than 100 mg/dL.  "Good" cholesterol, or HDL cholesterol. HDL helps protect against heart disease by cleaning the arteries and carrying the LDL to  the liver for processing. The desired level for HDL is 60 mg/dL or higher.  Triglycerides. These are fats that your body can store or burn for energy. The desired level is less than 150 mg/dL.  Total cholesterol. This measures the total amount of cholesterol in your blood and includes LDL, HDL, and triglycerides. The desired level is less than 200 mg/dL. How is this treated? This condition may be treated with:  Diet changes. You may be asked to eat foods that have more fiber and less saturated fats or added sugar.  Lifestyle changes. These may include regular exercise, maintaining a healthy weight, and quitting use of tobacco products.  Medicines. These are given when diet and lifestyle changes have not worked. You may be prescribed a statin medicine to help lower your cholesterol levels. Follow these instructions at home: Eating and drinking  Eat a healthy, balanced diet. This diet includes: ? Daily servings of a variety of fresh, frozen, or canned fruits and vegetables. ? Daily servings of whole grain foods that are rich in fiber. ? Foods that are low in saturated fats and trans fats. These include poultry and fish without skin, lean cuts of meat, and low-fat dairy products. ? A variety of fish, especially oily fish that contain omega-3 fatty acids. Aim to eat fish at least 2 times a week.  Avoid foods and drinks that have added sugar.  Use healthy cooking methods, such as roasting, grilling, broiling, baking, poaching, steaming, and stir-frying. Do not fry your food except for stir-frying.   Lifestyle  Get regular  exercise. Aim to exercise for a total of 150 minutes a week. Increase your activity level by doing activities such as gardening, walking, and taking the stairs.  Do not use any products that contain nicotine or tobacco, such as cigarettes, e-cigarettes, and chewing tobacco. If you need help quitting, ask your health care provider.   General instructions  Take  over-the-counter and prescription medicines only as told by your health care provider.  Keep all follow-up visits as told by your health care provider. This is important. Where to find more information  American Heart Association: www.heart.org  National Heart, Lung, and Blood Institute: https://wilson-eaton.com/ Contact a health care provider if:  You have trouble achieving or maintaining a healthy diet or weight.  You are starting an exercise program.  You are unable to stop smoking. Get help right away if:  You have chest pain.  You have trouble breathing.  You have any symptoms of a stroke. "BE FAST" is an easy way to remember the main warning signs of a stroke: ? B - Balance. Signs are dizziness, sudden trouble walking, or loss of balance. ? E - Eyes. Signs are trouble seeing or a sudden change in vision. ? F - Face. Signs are sudden weakness or numbness of the face, or the face or eyelid drooping on one side. ? A - Arms. Signs are weakness or numbness in an arm. This happens suddenly and usually on one side of the body. ? S - Speech. Signs are sudden trouble speaking, slurred speech, or trouble understanding what people say. ? T - Time. Time to call emergency services. Write down what time symptoms started.  You have other signs of a stroke, such as: ? A sudden, severe headache with no known cause. ? Nausea or vomiting. ? Seizure. These symptoms may represent a serious problem that is an emergency. Do not wait to see if the symptoms will go away. Get medical help right away. Call your local emergency services (911 in the [place] Do not drive yourself to the hospital. Summary  Cholesterol plaques increase your risk for heart attack and stroke. Work with your health care provider to keep your cholesterol levels in a healthy range.  Eat a healthy, balanced diet, get regular exercise, and maintain a healthy weight.  Do not use any products that contain nicotine or tobacco, such as  cigarettes, e-cigarettes, and chewing tobacco.  Get help right away if you have any symptoms of a stroke. This information is not intended to replace advice given to you by your health care provider. Make sure you discuss any questions you have with your health care provider. Document Revised: 07/21/2019 Document Reviewed: 07/21/2019 Elsevier Patient Education  2021 [place].  Cholesterol Content in Foods Cholesterol is a waxy, fat-like substance that helps to carry fat in the blood. The body needs cholesterol in small amounts, but too much cholesterol can cause damage to the arteries and heart. Most people should eat less than 200 milligrams (mg) of cholesterol a day. Foods with cholesterol Cholesterol is found in animal-based foods, such as meat, seafood, and dairy. Generally, low-fat dairy and lean meats have less cholesterol than full-fat dairy and fatty meats. The milligrams of cholesterol per serving (mg per serving) of common cholesterol-containing foods are listed below. Meat and other proteins  Egg -- one large whole egg has 186 mg.  Veal shank -- 4 oz has 141 mg.  Lean ground [place] (93% lean) -- 4 oz has 118 mg.  Fat-trimmed lamb  loin -- 4 oz has 106 mg.  Lean ground beef (90% lean) -- 4 oz has 100 mg.  Lobster -- 3.5 oz has 90 mg.  Pork loin chops -- 4 oz has 86 mg.  Canned salmon -- 3.5 oz has 83 mg.  Fat-trimmed beef top loin -- 4 oz has 78 mg.  Frankfurter -- 1 frank (3.5 oz) has 77 mg.  Crab -- 3.5 oz has 71 mg.  Roasted chicken without skin, white meat -- 4 oz has 66 mg.  Light bologna -- 2 oz has 45 mg.  Deli-cut [place] -- 2 oz has 31 mg.  Canned tuna -- 3.5 oz has 31 mg.  Berniece Salines -- 1 oz has 29 mg.  Oysters and mussels (raw) -- 3.5 oz has 25 mg.  Mackerel -- 1 oz has 22 mg.  Trout -- 1 oz has 20 mg.  Pork sausage -- 1 link (1 oz) has 17 mg.  Salmon -- 1 oz has 16 mg.  Tilapia -- 1 oz has 14 mg. Dairy  Soft-serve ice cream --  cup (4  oz) has 103 mg.  Whole-milk yogurt -- 1 cup (8 oz) has 29 mg.  Cheddar cheese -- 1 oz has 28 mg.  American cheese -- 1 oz has 28 mg.  Whole milk -- 1 cup (8 oz) has 23 mg.  2% milk -- 1 cup (8 oz) has 18 mg.  Cream cheese -- 1 tablespoon (Tbsp) has 15 mg.  Cottage cheese --  cup (4 oz) has 14 mg.  Low-fat (1%) milk -- 1 cup (8 oz) has 10 mg.  Sour cream -- 1 Tbsp has 8.5 mg.  Low-fat yogurt -- 1 cup (8 oz) has 8 mg.  Nonfat Greek yogurt -- 1 cup (8 oz) has 7 mg.  Half-and-half cream -- 1 Tbsp has 5 mg. Fats and oils  Cod liver oil -- 1 tablespoon (Tbsp) has 82 mg.  Butter -- 1 Tbsp has 15 mg.  Lard -- 1 Tbsp has 14 mg.  Bacon grease -- 1 Tbsp has 14 mg.  Mayonnaise -- 1 Tbsp has 5-10 mg.  Margarine -- 1 Tbsp has 3-10 mg. Exact amounts of cholesterol in these foods may vary depending on specific ingredients and brands.   Foods without cholesterol Most plant-based foods do not have cholesterol unless you combine them with a food that has cholesterol. Foods without cholesterol include:  Grains and cereals.  Vegetables.  Fruits.  Vegetable oils, such as olive, canola, and sunflower oil.  Legumes, such as peas, beans, and lentils.  Nuts and seeds.  Egg whites.   Summary  The body needs cholesterol in small amounts, but too much cholesterol can cause damage to the arteries and heart.  Most people should eat less than 200 milligrams (mg) of cholesterol a day. This information is not intended to replace advice given to you by your health care provider. Make sure you discuss any questions you have with your health care provider. Document Revised: 01/12/2020 Document Reviewed: 01/12/2020 Elsevier Patient Education  2021 [place].  Muscle Cramps and Spasms Muscle cramps and spasms occur when a muscle or muscles tighten and you have no control over this tightening (involuntary muscle contraction). They are a common problem and can develop in any muscle. The  most common place is in the calf muscles of the leg. Muscle cramps and muscle spasms are both involuntary muscle contractions, but there are some differences between the two:  Muscle cramps are painful. They come  and go and may last for a few seconds or up to 15 minutes. Muscle cramps are often more forceful and last longer than muscle spasms.  Muscle spasms may or may not be painful. They may also last just a few seconds or much longer. Certain medical conditions, such as diabetes or Parkinson's disease, can make it more likely to develop cramps or spasms. However, cramps or spasms are usually not caused by a serious underlying problem. Common causes include:  Doing more physical work or exercise than your body is ready for (overexertion).  Overuse from repeating certain movements too many times.  Remaining in a certain position for a long period of time.  Improper preparation, form, or technique while playing a sport or doing an activity.  Dehydration.  Injury.  Side effects of some medicines.  Abnormally low levels of the salts and minerals in your blood (electrolytes), especially potassium and calcium. This could happen if you are taking water pills (diuretics) or if you are pregnant. In many cases, the cause of muscle cramps or spasms is not known. Follow these instructions at home: Managing pain and stiffness  Try massaging, stretching, and relaxing the affected muscle. Do this for several minutes at a time.  If directed, apply heat to tight or tense muscles as often as told by your health care provider. Use the heat source that your health care provider recommends, such as a moist heat pack or a heating pad. ? Place a towel between your skin and the heat source. ? Leave the heat on for 20-30 minutes. ? Remove the heat if your skin turns bright red. This is especially important if you are unable to feel pain, heat, or cold. You may have a greater risk of getting burned.  If  directed, put ice on the affected area. This may help if you are sore or have pain after a cramp or spasm. ? Put ice in a plastic bag. ? Place a towel between your skin and the bag. ? Leavethe ice on for 20 minutes, 2-3 times a day.  Try taking hot showers or baths to help relax tight muscles.      Eating and drinking  Drink enough fluid to keep your urine pale yellow. Staying well hydrated may help prevent cramps or spasms.  Eat a healthy diet that includes plenty of nutrients to help your muscles function. A healthy diet includes fruits and vegetables, lean protein, whole grains, and low-fat or nonfat dairy products. General instructions  If you are having frequent cramps, avoid intense exercise for several days.  Take over-the-counter and prescription medicines only as told by your health care provider.  Pay attention to any changes in your symptoms.  Keep all follow-up visits as told by your health care provider. This is important. Contact a health care provider if:  Your cramps or spasms get more severe or happen more often.  Your cramps or spasms do not improve over time. Summary  Muscle cramps and spasms occur when a muscle or muscles tighten and you have no control over this tightening (involuntary muscle contraction).  The most common place for cramps or spasms to occur is in the calf muscles of the leg.  Massaging, stretching, and relaxing the affected muscle may relieve the cramp or spasm.  Drink enough fluid to keep your urine pale yellow. Staying well hydrated may help prevent cramps or spasms. This information is not intended to replace advice given to you by your health care provider. Make  sure you discuss any questions you have with your health care provider. Document Revised: 01/14/2018 Document Reviewed: 01/14/2018 Elsevier Patient Education  [place].

## 2020-11-22 ENCOUNTER — Telehealth: Payer: Self-pay | Admitting: Nurse Practitioner

## 2020-11-22 ENCOUNTER — Telehealth: Payer: Self-pay | Admitting: Internal Medicine

## 2020-11-22 NOTE — Telephone Encounter (Signed)
Error

## 2020-11-22 NOTE — Telephone Encounter (Signed)
Liver kidneys normal  Salts in body normal  Increase water 55-64 ounces daily  Total cholesterol improved and LDL bad cholesterol improved  Triglycerides elevated goal is <150  HDL sl low for man goal is 40 or above (good cholesterol)  -mail cholesterol handout   -does he want to try a less strong cholesterol medication?   Magnesium normal  Muscle enzyme normal  Thyroid lab normal   You have diabetes  -do you want to see nutrition?  -rec healthy diet and exercise  -do you want to try metformin medication?  PSA prostate cancer screening normal <4 at goal  Blood cts normal

## 2020-11-22 NOTE — Telephone Encounter (Signed)
Patient informed and verbalized understanding.   Patient agreeable to statin therapy.  Patient agreeable to metformin.  Uses [facility]   Handout mailed

## 2020-11-23 NOTE — Telephone Encounter (Signed)
Patient calling back in to check status of medication being sent in

## 2020-11-25 ENCOUNTER — Other Ambulatory Visit: Payer: Self-pay | Admitting: Internal Medicine

## 2020-11-25 DIAGNOSIS — E1159 Type 2 diabetes mellitus with other circulatory complications: Secondary | ICD-10-CM

## 2020-11-25 DIAGNOSIS — E785 Hyperlipidemia, unspecified: Secondary | ICD-10-CM

## 2020-11-25 DIAGNOSIS — I152 Hypertension secondary to endocrine disorders: Secondary | ICD-10-CM

## 2020-11-25 MED ORDER — METFORMIN HCL ER 500 MG PO TB24: 500.0000 mg | ORAL_TABLET | Freq: Every day | ORAL | 3 refills | Status: DC

## 2020-11-25 MED ORDER — ROSUVASTATIN CALCIUM 5 MG PO TABS
ORAL_TABLET | ORAL | 3 refills | Status: DC
Start: 2020-11-25 — End: 2022-01-18

## 2020-11-25 NOTE — Telephone Encounter (Signed)
Patient informed and verbalized understanding

## 2020-11-25 NOTE — Telephone Encounter (Signed)
Sent metformin  Reduce statin to crestor 10 to 5 mg 3x per week  Let me know if still getting cramps

## 2020-11-30 ENCOUNTER — Telehealth: Payer: Self-pay | Admitting: Internal Medicine

## 2020-11-30 DIAGNOSIS — I152 Hypertension secondary to endocrine disorders: Secondary | ICD-10-CM | POA: Insufficient documentation

## 2020-11-30 DIAGNOSIS — E1159 Type 2 diabetes mellitus with other circulatory complications: Secondary | ICD-10-CM | POA: Insufficient documentation

## 2020-11-30 NOTE — Telephone Encounter (Signed)
-----   Message from Delorise Jackson, MD sent at 11/30/2020  8:10 AM EDT ----- Check Tdap in 2014/2015 and log

## 2020-11-30 NOTE — Telephone Encounter (Signed)
Immunization not recorded in [employer]

## 2021-01-17 ENCOUNTER — Encounter: Payer: Medicare Other | Admitting: Adult Health

## 2021-02-10 IMAGING — MG DIGITAL DIAGNOSTIC BILAT W/ TOMO W/ CAD
6 of 10 series · 6 of 30 positions shown · non-contrast
Comparison: None.

ACR Breast Density Category a: The breast tissue is almost entirely
fatty.

CLINICAL DATA: 63-year-old male presenting for evaluation of right
breast tenderness and a palpable lump for about 6 months.The patient
has family history of breast cancer in his mother.

EXAM:
DIGITAL DIAGNOSTIC BILATERAL MAMMOGRAM WITH CAD AND TOMO

[R MLO synth-2D (1 of 2)]
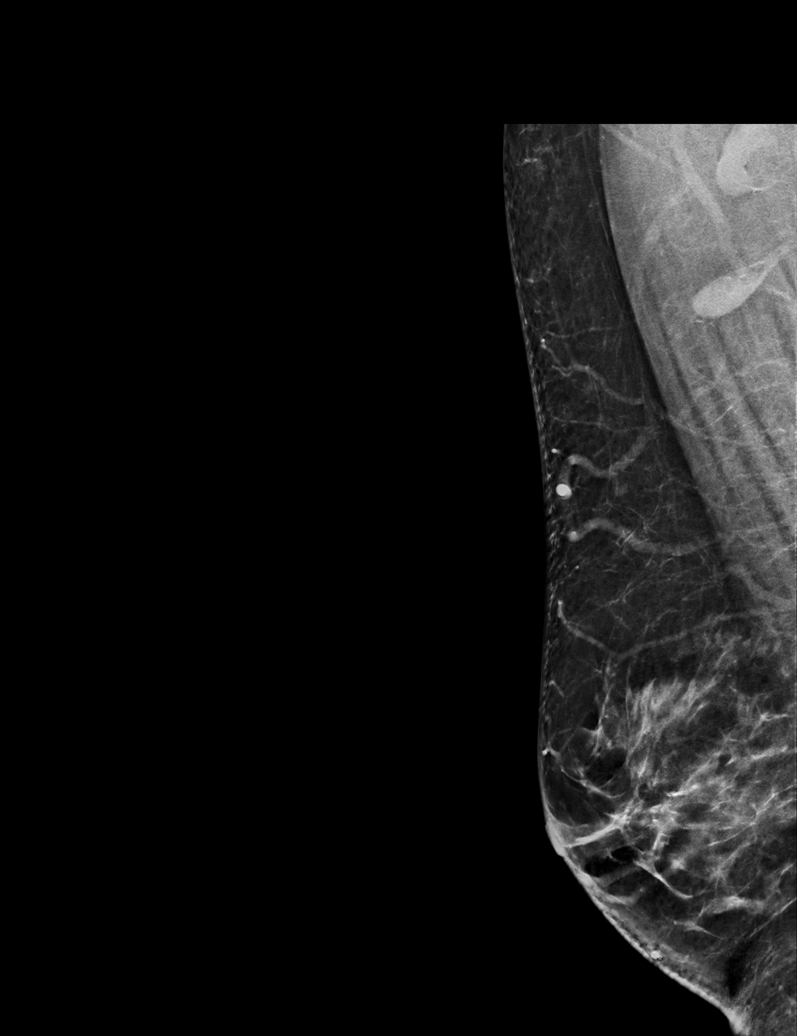

[L MLO synth-2D]
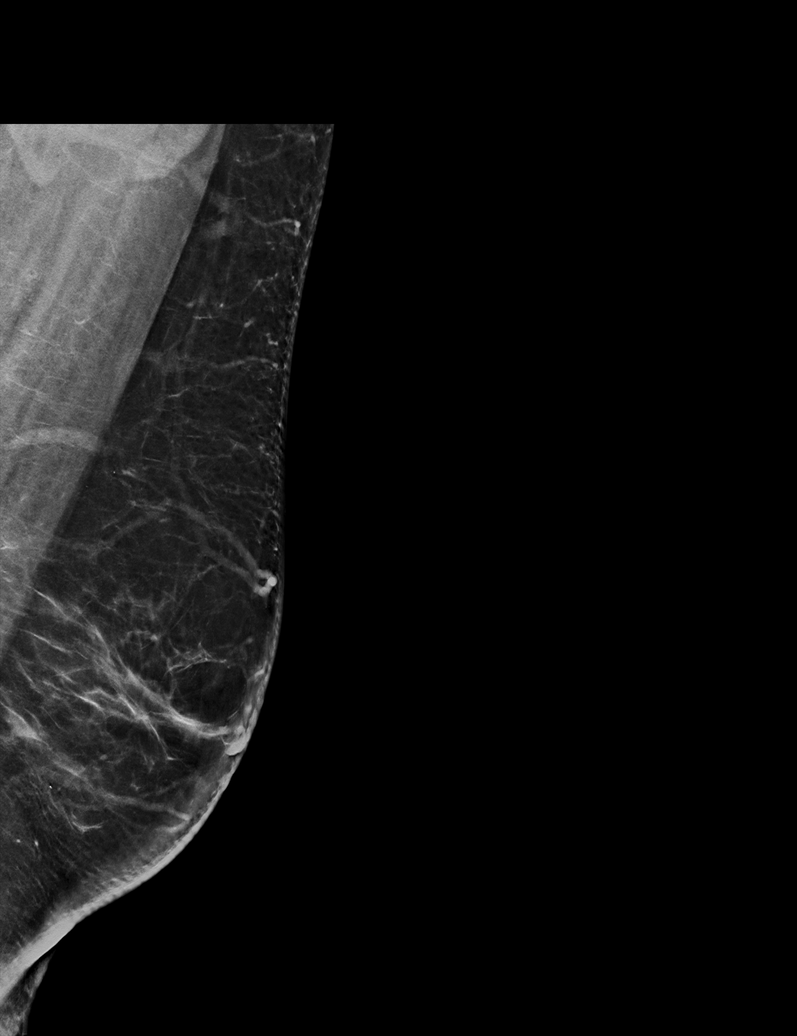

[L CC synth-2D]
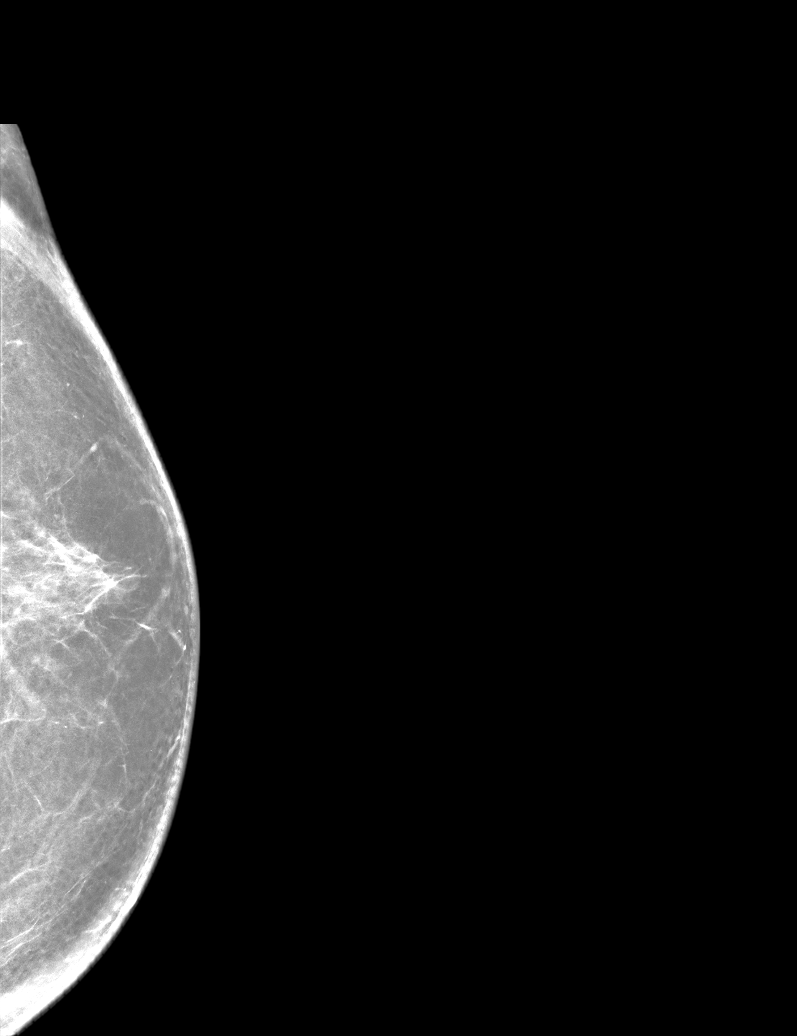

[R CC synth-2D]
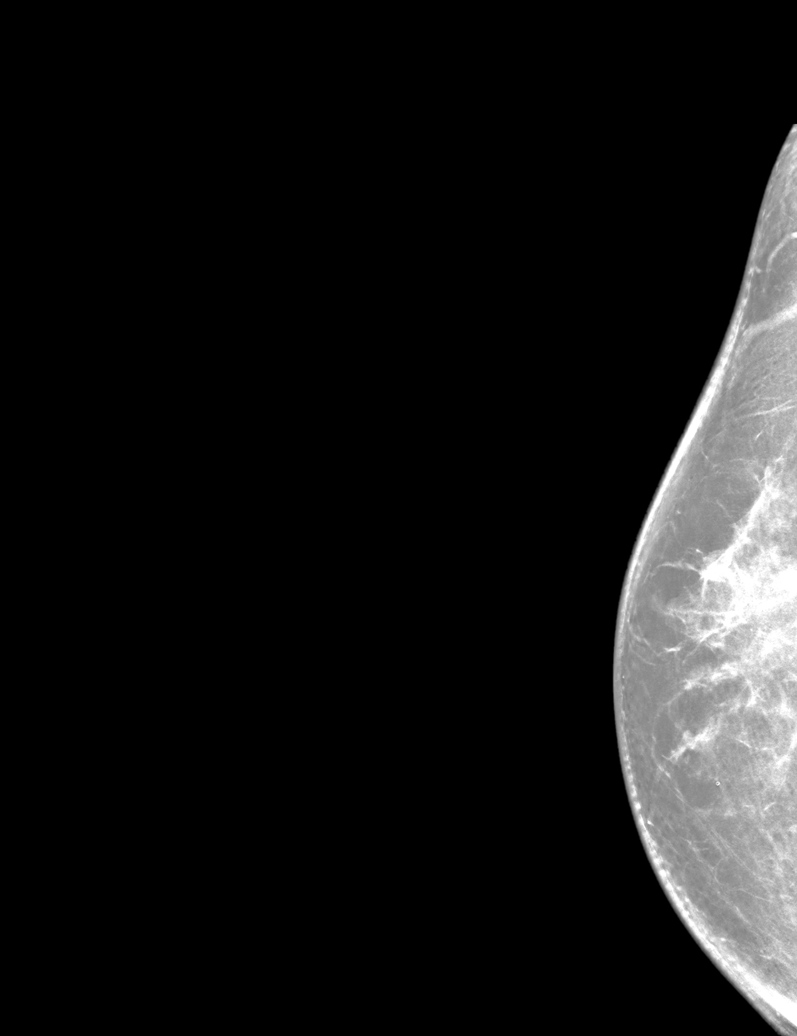

[R MLO synth-2D (2 of 2)]
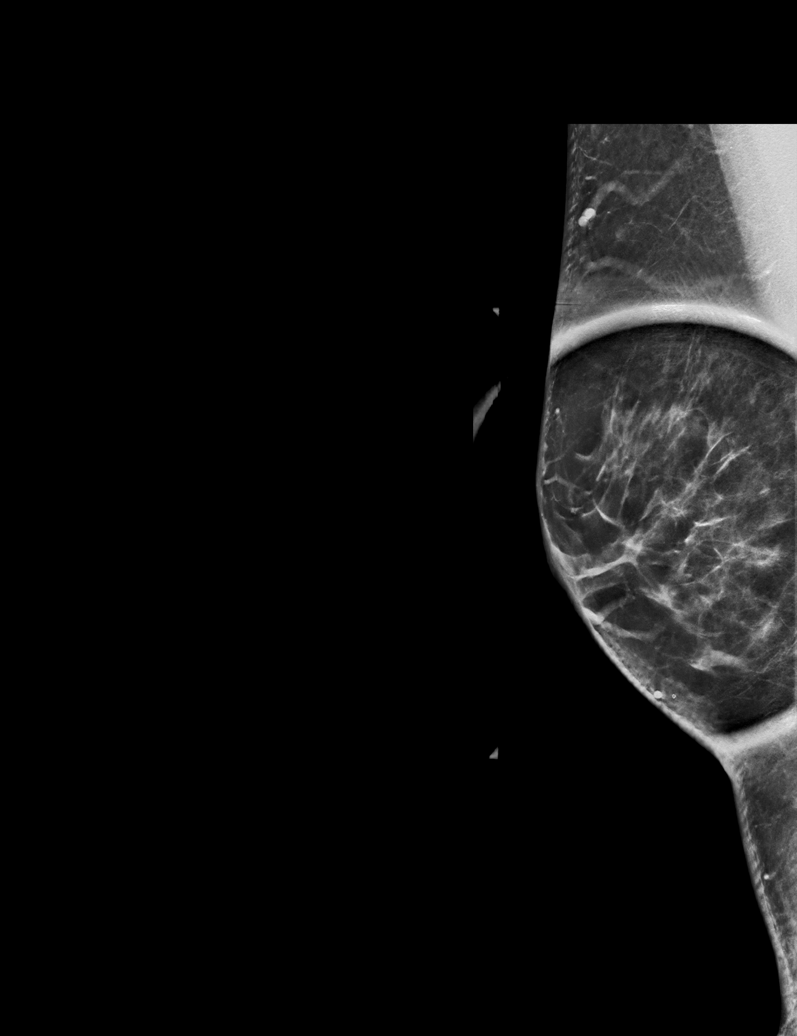

[R MLO tomo · tomo slice 37/73.0]
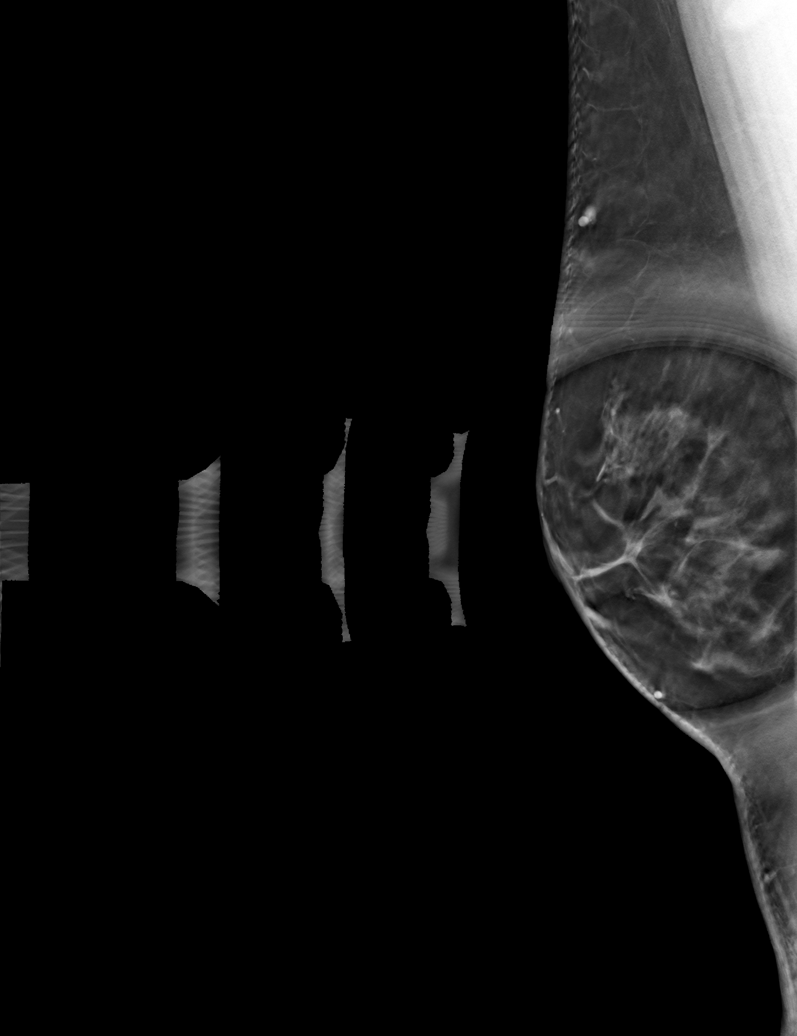

[6 of 30 positions shown; findings below may reference images not displayed]

FINDINGS: There is flame shaped fibroglandular tissue in the breasts
bilaterally, right greater than left. No suspicious calcifications,
masses or areas of distortion are seen in the bilateral breasts.

Mammographic images were processed with CAD.
IMPRESSION: 1. There is right greater than left benign gynecomastia, which
likely accounts for the patient's palpable lump and tenderness.

2.  No evidence of malignancy in the bilateral breasts.

RECOMMENDATION:
Clinical follow-up recommended for symptomatic benign gynecomastia.

I have discussed the findings and recommendations with the patient.
If applicable, a reminder letter will be sent to the patient
regarding the next appointment.

BI-RADS CATEGORY  2: Benign.

## 2021-02-21 ENCOUNTER — Telehealth: Payer: Self-pay | Admitting: Adult Health

## 2021-02-21 ENCOUNTER — Telehealth: Payer: Medicare Other | Admitting: Adult Health

## 2021-02-21 NOTE — Telephone Encounter (Signed)
I called Benjamin Armstrong to reschedule his appt for 02/21/21. Before hanging up Benjamin Armstrong asked was Sharyn Lull a pretty lady. I told him to have a good day and I hung up the phone

## 2021-02-24 ENCOUNTER — Telehealth: Payer: Medicare Other | Admitting: Adult Health

## 2021-02-28 ENCOUNTER — Encounter: Payer: Self-pay | Admitting: Adult Health

## 2021-03-09 ENCOUNTER — Ambulatory Visit: Payer: Medicare Other | Admitting: Adult Health

## 2021-04-06 ENCOUNTER — Ambulatory Visit (INDEPENDENT_AMBULATORY_CARE_PROVIDER_SITE_OTHER): Payer: Medicare Other

## 2021-04-06 VITALS — Ht 74.0 in | Wt 238.0 lb

## 2021-04-06 DIAGNOSIS — Z0000 Encounter for general adult medical examination without abnormal findings: Secondary | ICD-10-CM

## 2021-04-06 NOTE — Patient Instructions (Addendum)
  Mr. Benjamin Armstrong , Thank you for taking time to come for your Medicare Wellness Visit. I appreciate your ongoing commitment to your health goals. Please review the following plan we discussed and let me know if I can assist you in the future.   These are the goals we discussed:  Goals       Patient Stated     Blood Pressure < 140/90 (pt-stated)      Spot check blood pressure        This is a list of the screening recommended for you and due dates:  Health Maintenance  Topic Date Due   Complete foot exam   Never done   Eye exam for diabetics  Never done   Flu Shot  05/23/2021*   COVID-19 Vaccine (1) 12/03/2021*   Hemoglobin A1C  05/22/2021   Cologuard (Stool DNA test)  05/26/2023   Hepatitis C Screening: USPSTF Recommendation to screen - Ages 18-79 yo.  Completed   HPV Vaccine  Aged Out   Tetanus Vaccine  Discontinued   Pneumonia vaccines  Discontinued   Zoster (Shingles) Vaccine  Discontinued  *Topic was postponed. The date shown is not the original due date.
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
## 2021-05-02 ENCOUNTER — Other Ambulatory Visit: Payer: Self-pay

## 2021-05-02 ENCOUNTER — Ambulatory Visit (INDEPENDENT_AMBULATORY_CARE_PROVIDER_SITE_OTHER): Payer: Medicare Other | Admitting: Cardiology

## 2021-05-02 ENCOUNTER — Encounter: Payer: Self-pay | Admitting: Cardiology

## 2021-05-02 VITALS — BP 140/60 | HR 82 | Ht 74.0 in | Wt 238.2 lb

## 2021-05-02 DIAGNOSIS — E782 Mixed hyperlipidemia: Secondary | ICD-10-CM

## 2021-05-02 DIAGNOSIS — R0602 Shortness of breath: Secondary | ICD-10-CM

## 2021-05-02 DIAGNOSIS — F17200 Nicotine dependence, unspecified, uncomplicated: Secondary | ICD-10-CM

## 2021-05-02 DIAGNOSIS — I10 Essential (primary) hypertension: Secondary | ICD-10-CM | POA: Diagnosis not present

## 2021-05-02 MED ORDER — FUROSEMIDE 20 MG PO TABS: 20.0000 mg | ORAL_TABLET | Freq: Every day | ORAL | 3 refills | Status: DC | PRN
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
## 2021-05-02 NOTE — Patient Instructions (Signed)
Medication Instructions:   Your physician has recommended you make the following change in your medication:   START taking Lasix 20 MG once a day as needed for swelling.  *If you need a refill on your cardiac medications before your next appointment, please call your pharmacy*   Lab Work:   Please return for lab work in our office in 1-2 weeks on __________at___________am/pm   Testing/Procedures: None ordered   Follow-Up: At [facility], you and your health needs are our priority.  As part of our continuing mission to provide you with exceptional heart care, we have created designated Provider Care Teams.  These Care Teams include your primary Cardiologist (physician) and Advanced Practice Providers (APPs -  Physician Assistants and Nurse Practitioners) who all work together to provide you with the care you need, when you need it.  We recommend signing up for the patient portal called "MyChart".  Sign up information is provided on this After Visit Summary.  MyChart is used to connect with patients for Virtual Visits (Telemedicine).  Patients are able to view lab/test results, encounter notes, upcoming appointments, etc.  Non-urgent messages can be sent to your provider as well.   To learn more about what you can do with MyChart, go to [email or web address].    Your next appointment:   3 month(s)  The format for your next appointment:   In Person  Provider:   Kate Sable, MD   Other Instructions

## 2021-06-01 ENCOUNTER — Ambulatory Visit: Payer: Medicare Other | Admitting: Adult Health

## 2021-08-04 ENCOUNTER — Other Ambulatory Visit: Payer: Self-pay

## 2021-08-04 ENCOUNTER — Encounter: Payer: Self-pay | Admitting: Cardiology

## 2021-08-04 ENCOUNTER — Ambulatory Visit (INDEPENDENT_AMBULATORY_CARE_PROVIDER_SITE_OTHER): Payer: Medicare Other | Admitting: Cardiology

## 2021-08-04 VITALS — BP 138/64 | HR 80 | Ht 74.0 in | Wt 240.0 lb

## 2021-08-04 DIAGNOSIS — F17200 Nicotine dependence, unspecified, uncomplicated: Secondary | ICD-10-CM | POA: Diagnosis not present

## 2021-08-04 DIAGNOSIS — I10 Essential (primary) hypertension: Secondary | ICD-10-CM | POA: Diagnosis not present

## 2021-08-04 DIAGNOSIS — E782 Mixed hyperlipidemia: Secondary | ICD-10-CM | POA: Diagnosis not present

## 2021-08-04 DIAGNOSIS — R3916 Straining to void: Secondary | ICD-10-CM | POA: Diagnosis not present

## 2021-08-04 MED ORDER — LOSARTAN POTASSIUM 100 MG PO TABS: 100.0000 mg | ORAL_TABLET | Freq: Every day | ORAL | 1 refills | Status: DC
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
## 2021-08-04 NOTE — Patient Instructions (Signed)
Medication Instructions:   Your physician has recommended you make the following change in your medication:    STOP taking your Irbesartan.  2.    START taking Losartan 100 MG once a day.  *If you need a refill on your cardiac medications before your next appointment, please call your pharmacy*   Lab Work:  None Ordered   Testing/Procedures:  None Ordered   Follow-Up: At [facility], you and your health needs are our priority.  As part of our continuing mission to provide you with exceptional heart care, we have created designated Provider Care Teams.  These Care Teams include your primary Cardiologist (physician) and Advanced Practice Providers (APPs -  Physician Assistants and Nurse Practitioners) who all work together to provide you with the care you need, when you need it.  We recommend signing up for the patient portal called "MyChart".  Sign up information is provided on this After Visit Summary.  MyChart is used to connect with patients for Virtual Visits (Telemedicine).  Patients are able to view lab/test results, encounter notes, upcoming appointments, etc.  Non-urgent messages can be sent to your provider as well.   To learn more about what you can do with MyChart, go to [email or web address].    Your next appointment:   6 month(s)  The format for your next appointment:   In Person  Provider:   You may see Kate Sable, MD or one of the following Advanced Practice Providers on your designated Care Team:   Murray Hodgkins, NP Christell Faith, PA-C Cadence Kathlen Mody, [place]    Other Instructions
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
## 2021-08-11 ENCOUNTER — Ambulatory Visit (INDEPENDENT_AMBULATORY_CARE_PROVIDER_SITE_OTHER): Payer: Medicare Other | Admitting: Urology

## 2021-08-11 ENCOUNTER — Encounter: Payer: Self-pay | Admitting: Urology

## 2021-08-11 ENCOUNTER — Other Ambulatory Visit: Payer: Self-pay

## 2021-08-11 VITALS — BP 112/70 | HR 96 | Ht 74.0 in | Wt 238.0 lb

## 2021-08-11 DIAGNOSIS — R39198 Other difficulties with micturition: Secondary | ICD-10-CM | POA: Diagnosis not present

## 2021-08-11 LAB — BLADDER SCAN AMB NON-IMAGING: Scan Result: 27

## 2021-08-11 MED ORDER — TAMSULOSIN HCL 0.4 MG PO CAPS: 0.4000 mg | ORAL_CAPSULE | Freq: Every day | ORAL | 6 refills | Status: DC

## 2021-08-11 MED ORDER — SILDENAFIL CITRATE 20 MG PO TABS: 20.0000 mg | ORAL_TABLET | Freq: Three times a day (TID) | ORAL | 0 refills | Status: DC

## 2021-08-11 NOTE — Addendum Note (Signed)
Addended by: Alvera Novel on: 08/11/2021 02:53 PM   Modules accepted: Orders

## 2021-08-11 NOTE — Patient Instructions (Signed)
Cystoscopy Cystoscopy is a procedure that is used to help diagnose and sometimes treat conditions that affect the lower urinary tract. The lower urinary tract includes the bladder and the urethra. The urethra is the tube that drains urine from the bladder. Cystoscopy is done using a thin, tube-shaped instrument with a light and camera at the end (cystoscope). The cystoscope may be hard or flexible, depending on the goal of the procedure. The cystoscope is inserted through the urethra, into the bladder. Cystoscopy may be recommended if you have: Urinary tract infections that keep coming back. Blood in the urine (hematuria). An inability to control when you urinate (urinary incontinence) or an overactive bladder. Unusual cells found in a urine sample. A blockage in the urethra, such as a urinary stone. Painful urination. An abnormality in the bladder found during an intravenous pyelogram (IVP) or CT scan. What are the risks? Generally, this is a safe procedure. However, problems may occur, including: Infection. Bleeding.  What happens during the procedure?  You will be given one or more of the following: A medicine to numb the area (local anesthetic). The area around the opening of your urethra will be cleaned. The cystoscope will be passed through your urethra into your bladder. Germ-free (sterile) fluid will flow through the cystoscope to fill your bladder. The fluid will stretch your bladder so that your health care provider can clearly examine your bladder walls. Your doctor will look at the urethra and bladder. The cystoscope will be removed The procedure may vary among health care providers  What can I expect after the procedure? After the procedure, it is common to have: Some soreness or pain in your urethra. Urinary symptoms. These include: Mild pain or burning when you urinate. Pain should stop within a few minutes after you urinate. This may last for up to a few days after the  procedure. A small amount of blood in your urine for several days. Feeling like you need to urinate but producing only a small amount of urine. Follow these instructions at home: General instructions Return to your normal activities as told by your health care provider.  Drink plenty of fluids after the procedure. Keep all follow-up visits as told by your health care provider. This is important. Contact a health care provider if you: Have pain that gets worse or does not get better with medicine, especially pain when you urinate lasting longer than 72 hours after the procedure. Have trouble urinating. Get help right away if you: Have blood clots in your urine. Have a fever or chills. Are unable to urinate. Summary Cystoscopy is a procedure that is used to help diagnose and sometimes treat conditions that affect the lower urinary tract. Cystoscopy is done using a thin, tube-shaped instrument with a light and camera at the end. After the procedure, it is common to have some soreness or pain in your urethra. It is normal to have blood in your urine after the procedure.  If you were prescribed an antibiotic medicine, take it as told by your health care provider.  This information is not intended to replace advice given to you by your health care provider. Make sure you discuss any questions you have with your health care provider. Document Revised: 08/13/2018 Document Reviewed: 08/13/2018 Elsevier Patient Education  [place].  Transrectal Ultrasound A transrectal ultrasound is a procedure that uses sound waves to create images of the prostate gland and nearby tissues. For this procedure, an ultrasound probe is placed in the  rectum. The probe sends sound waves through the wall of the rectum into the prostate gland. The prostate is a walnut-sized gland that is located below the bladder and in front of the rectum. The images show the size and shape of the prostate gland and nearby  structures. You may need this test if you have: Trouble urinating. Trouble getting your partner pregnant (infertility). An abnormal result from a prostate screening exam. Tell a health care provider about: Any allergies you have. All medicines you are taking, including vitamins, herbs, eye drops, creams, and over-the-counter medicines. Any bleeding problems you have. Any surgeries you have had. Any medical conditions you have. Any prostate infections you have had. What are the risks? Generally, this is a safe procedure. However, problems may occur, including: Discomfort during the procedure. Blood in your urine or sperm after the procedure. This may occur if a sample of tissue is taken to look at under a microscope (biopsy) during the procedure. What happens before the procedure? Your health care provider may instruct you to use an enema 1-4 hours before the procedure. Follow instructions from your health care provider about how to do the enema. Ask your health care provider about: Changing or stopping your regular medicines. This is especially important if you are taking diabetes medicines or blood thinners. Taking medicines such as aspirin and ibuprofen. These medicines can thin your blood. Do not take these medicines unless your health care provider tells you to take them. Taking over-the-counter medicines, vitamins, herbs, and supplements. What happens during the procedure? You will be asked to lie down on your left side on an exam table. You will bend your knees toward your chest. Gel will be put on a small probe that is about the width of a finger. The probe will be gently inserted into your rectum. You may have a feeling of fullness but should not feel pain. The probe will send signals to a computer that will create images. These will be displayed on a monitor that looks like a small television screen. The technician will slightly rotate the probe throughout the procedure. While  rotating the probe, he or she will view and capture images of the prostate gland and the surrounding structures from different angles. Your health care provider may take a biopsy sample of prostate tissue during the procedure. The images captured from the ultrasound will help guide the needle that is used to remove a sample of tissue. The sample will be sent to a lab for testing. The probe will be removed. The procedure may vary among health care providers and hospitals. What can I expect after the procedure? It is up to you to get the results of your procedure. Ask your health care provider, or the department that is doing the procedure, when your results will be ready. Keep all follow-up visits. This is important. Summary A transrectal ultrasound is a procedure that uses sound waves to create images of the prostate gland and nearby tissues. The images show the size and shape of the prostate gland and nearby structures. Before the procedure, ask your health care provider about changing or stopping your regular medicines. This is especially important if you are taking diabetes medicines or blood thinners. This information is not intended to replace advice given to you by your health care provider. Make sure you discuss any questions you have with your health care provider. Document Revised: 05/04/2021 Document Reviewed: 02/14/2021 Elsevier Patient Education  [place].
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
## 2021-09-20 ENCOUNTER — Ambulatory Visit (INDEPENDENT_AMBULATORY_CARE_PROVIDER_SITE_OTHER): Payer: Medicare Other | Admitting: Urology

## 2021-09-20 ENCOUNTER — Other Ambulatory Visit: Payer: Self-pay

## 2021-09-20 DIAGNOSIS — N400 Enlarged prostate without lower urinary tract symptoms: Secondary | ICD-10-CM | POA: Diagnosis not present

## 2021-09-20 DIAGNOSIS — R39198 Other difficulties with micturition: Secondary | ICD-10-CM

## 2021-09-20 LAB — URINALYSIS, COMPLETE
Bilirubin, UA: NEGATIVE
Glucose, UA: NEGATIVE
Ketones, UA: NEGATIVE
Nitrite, UA: NEGATIVE
Protein,UA: NEGATIVE
Specific Gravity, UA: 1.01 (ref 1.005–1.030)
Urobilinogen, Ur: 0.2 mg/dL (ref 0.2–1.0)
pH, UA: 6 (ref 5.0–7.5)

## 2021-09-20 LAB — MICROSCOPIC EXAMINATION
Bacteria, UA: NONE SEEN
WBC, UA: >30 /hpf — AB (ref 0–5)

## 2021-09-20 MED ORDER — SILDENAFIL CITRATE 20 MG PO TABS: ORAL_TABLET | ORAL | 2 refills | Status: DC

## 2021-10-31 ENCOUNTER — Other Ambulatory Visit: Payer: Self-pay | Admitting: *Deleted

## 2021-10-31 MED ORDER — FUROSEMIDE 20 MG PO TABS: 20.0000 mg | ORAL_TABLET | Freq: Every day | ORAL | 4 refills | Status: DC | PRN

## 2022-01-14 ENCOUNTER — Other Ambulatory Visit: Payer: Self-pay | Admitting: Internal Medicine

## 2022-01-14 DIAGNOSIS — E1159 Type 2 diabetes mellitus with other circulatory complications: Secondary | ICD-10-CM

## 2022-01-14 DIAGNOSIS — E785 Hyperlipidemia, unspecified: Secondary | ICD-10-CM

## 2022-01-16 ENCOUNTER — Other Ambulatory Visit: Payer: Self-pay

## 2022-01-16 MED ORDER — LOSARTAN POTASSIUM 100 MG PO TABS: 100.0000 mg | ORAL_TABLET | Freq: Every day | ORAL | 0 refills | Status: DC

## 2022-02-06 ENCOUNTER — Ambulatory Visit: Payer: Medicare Other | Admitting: Cardiology

## 2022-02-08 ENCOUNTER — Encounter: Payer: Self-pay | Admitting: Family Medicine

## 2022-02-08 ENCOUNTER — Ambulatory Visit (INDEPENDENT_AMBULATORY_CARE_PROVIDER_SITE_OTHER): Payer: Medicare Other | Admitting: Family Medicine

## 2022-02-08 ENCOUNTER — Telehealth: Payer: Self-pay | Admitting: Adult Health

## 2022-02-08 VITALS — BP 130/80 | HR 85 | Temp 97.9°F | Ht 74.0 in | Wt 246.0 lb

## 2022-02-08 DIAGNOSIS — R0609 Other forms of dyspnea: Secondary | ICD-10-CM | POA: Diagnosis not present

## 2022-02-08 DIAGNOSIS — Z720 Tobacco use: Secondary | ICD-10-CM

## 2022-02-08 DIAGNOSIS — E119 Type 2 diabetes mellitus without complications: Secondary | ICD-10-CM | POA: Insufficient documentation

## 2022-02-08 DIAGNOSIS — R7309 Other abnormal glucose: Secondary | ICD-10-CM | POA: Diagnosis not present

## 2022-02-08 DIAGNOSIS — N400 Enlarged prostate without lower urinary tract symptoms: Secondary | ICD-10-CM | POA: Insufficient documentation

## 2022-02-08 DIAGNOSIS — E785 Hyperlipidemia, unspecified: Secondary | ICD-10-CM

## 2022-02-08 DIAGNOSIS — N401 Enlarged prostate with lower urinary tract symptoms: Secondary | ICD-10-CM

## 2022-02-08 DIAGNOSIS — N6325 Unspecified lump in the left breast, overlapping quadrants: Secondary | ICD-10-CM

## 2022-02-08 DIAGNOSIS — N6320 Unspecified lump in the left breast, unspecified quadrant: Secondary | ICD-10-CM | POA: Insufficient documentation

## 2022-02-08 DIAGNOSIS — I10 Essential (primary) hypertension: Secondary | ICD-10-CM

## 2022-02-08 LAB — LIPID PANEL
Cholesterol: 180 mg/dL (ref 0–200)
HDL: 41.1 mg/dL (ref >39.00)
LDL Cholesterol: 111 mg/dL — ABNORMAL HIGH (ref 0–99)
NonHDL: 138.65
Total CHOL/HDL Ratio: 4
Triglycerides: 139 mg/dL (ref 0.0–149.0)
VLDL: 27.8 mg/dL (ref 0.0–40.0)

## 2022-02-08 LAB — COMPREHENSIVE METABOLIC PANEL
ALT: 22 U/L (ref 0–53)
AST: 17 U/L (ref 0–37)
Albumin: 4.1 g/dL (ref 3.5–5.2)
Alkaline Phosphatase: 57 U/L (ref 39–117)
BUN: 14 mg/dL (ref 6–23)
CO2: 29 mEq/L (ref 19–32)
Calcium: 9.4 mg/dL (ref 8.4–10.5)
Chloride: 100 mEq/L (ref 96–112)
Creatinine, Ser: 0.92 mg/dL (ref 0.40–1.50)
GFR: 85.84 mL/min (ref >60.00)
Glucose, Bld: 91 mg/dL (ref 70–99)
Potassium: 4.3 mEq/L (ref 3.5–5.1)
Sodium: 137 mEq/L (ref 135–145)
Total Bilirubin: 0.6 mg/dL (ref 0.2–1.2)
Total Protein: 6.7 g/dL (ref 6.0–8.3)

## 2022-02-08 LAB — HEMOGLOBIN A1C: Hgb A1c MFr Bld: 6.7 % — ABNORMAL HIGH (ref 4.6–6.5)

## 2022-02-08 MED ORDER — ALBUTEROL SULFATE HFA 108 (90 BASE) MCG/ACT IN AERS: 2.0000 | INHALATION_SPRAY | Freq: Four times a day (QID) | RESPIRATORY_TRACT | 0 refills | Status: DC | PRN

## 2022-02-08 NOTE — Addendum Note (Signed)
Addended by: Caryl Bis Genette Huertas G on: 02/08/2022 11:46 AM   Modules accepted: Level of Service

## 2022-02-08 NOTE — Assessment & Plan Note (Signed)
Discussed that he would need multiple elevated A1c is to be diagnosed with diabetes.  We will check an A1c today.  He will continue to monitor his sugar intake.

## 2022-02-08 NOTE — Telephone Encounter (Signed)
Patient called and phone number given to patient for [facility].

## 2022-02-08 NOTE — Telephone Encounter (Signed)
Lft pt vm to call ofc to give the number to [facility] [phone]. thanks

## 2022-02-08 NOTE — Patient Instructions (Signed)
Nice to see you.  Nice to see you. Somebody should contact you to schedule your mammogram.  If you do not hear anything within the next several days please let us know. We will contact you with your lab results.

## 2022-02-08 NOTE — Assessment & Plan Note (Signed)
He will follow-up with urology.

## 2022-02-08 NOTE — Assessment & Plan Note (Signed)
Check lipid panel  

## 2022-02-08 NOTE — Assessment & Plan Note (Signed)
He was encouraged to quit smoking.

## 2022-02-08 NOTE — Assessment & Plan Note (Signed)
This has been an ongoing issue for at least a number of months.  Discussed my concern is for breast cancer.  We will get a mammogram and ultrasound as quickly as possible.  He was advised to contact us if he does not hear about getting this scheduled within this week.

## 2022-02-08 NOTE — Assessment & Plan Note (Signed)
Adequately controlled today.  He will continue losartan 100 mg daily and Lasix 20 mg daily as needed for swelling.  Check labs.

## 2022-02-08 NOTE — Assessment & Plan Note (Signed)
Patient has been evaluated by cardiology.  They felt as though his symptoms were most likely smoking/COPD related.  We will get PFTs.  I did prescribe an albuterol inhaler for him to use as needed for shortness of breath.

## 2022-02-08 NOTE — Progress Notes (Addendum)
Tommi Rumps, MD Phone: [phone]  Benjamin Armstrong is a 66 y.o. male who presents today for f/u.  HYPERTENSION Disease Monitoring Chest pain- no    Dyspnea- yes Medications Compliance-  taking losartan, lasix.   Edema- occasionally in legs BMET    Component Value Date/Time   NA 139 11/19/2020 1145   K 4.3 11/19/2020 1145   CL 103 11/19/2020 1145   CO2 28 11/19/2020 1145   GLUCOSE 121 (H) 11/19/2020 1145   BUN 14 11/19/2020 1145   CREATININE 1.02 11/19/2020 1145   CALCIUM 9.2 11/19/2020 1145   Elevated A1c: Patient's most recent A1c was a little more than a year ago and was 6.5.  He notes the physician wanted to start him on a medication for this though he did not want to do that.  He wanted to work on his diet.  He has cut down on his sugar intake though does still have sugar in his coffee and does have doughnuts.  Dyspnea on exertion: This has been an ongoing issue.  He has seen cardiology and he had an echo which was reassuring.  He had a stress test that had no ischemic changes though had submaximal stress levels.  His cardiologist has noted in their notes that this could be COPD/smoking related.  The patient has not had any evaluation for COPD.  Left breast mass: Patient notes this has been present for 3 to 4 months.  He had some soreness in his right breast previously that was evaluated with a mammogram that was negative.  He notes last year he developed some stinging and itching around his left nipple and then subsequently developed a nodule.  There has been some traction on the left nipple related to this nodule.  He does report his mother had breast cancer and he thinks his sister had breast cancer as well.  Urinary difficulty: Patient reports continued issues with his urination.  At times he has difficulty controlling his urine.  He did see urology in December that he was diagnosed with BPH.  They placed him on Flomax and he has not noticed much benefit.  He notes he needs  to follow-up with them.  Social History   Tobacco Use  Smoking Status Every Day  . Packs/day: 1.00  . Years: 53.00  . Pack years: 53.00  . Types: Cigarettes  Smokeless Tobacco Never    Current Outpatient Medications on File Prior to Visit  Medication Sig Dispense Refill  . EPINEPHrine 0.3 mg/0.3 mL IJ SOAJ injection Inject 0.3 mg into the muscle as needed for anaphylaxis. 2 each 1  . losartan (COZAAR) 100 MG tablet Take 1 tablet (100 mg total) by mouth daily. 90 tablet 0  . rosuvastatin (CRESTOR) 5 MG tablet TAKE 1 TABLET BY MOUTH 3 TIMES PER WEEK ON TUESDAY, THURSDAY, AND SATURDAY 36 tablet 9  . sildenafil (REVATIO) 20 MG tablet 1-5 tablets as needed one hour prior to intercourse 30 tablet 2  . tamsulosin (FLOMAX) 0.4 MG CAPS capsule Take 1 capsule (0.4 mg total) by mouth daily. 30 capsule 6  . furosemide (LASIX) 20 MG tablet Take 1 tablet (20 mg total) by mouth daily as needed. For swelling 30 tablet 4   No current facility-administered medications on file prior to visit.     ROS see history of present illness  Objective  Physical Exam Vitals:   02/08/22 1040  BP: 130/80  Pulse: 85  Temp: 97.9 F (36.6 C)  SpO2: 96%    BP  Readings from Last 3 Encounters:  02/08/22 130/80  09/20/21 (!) 151/71  08/11/21 112/70   Wt Readings from Last 3 Encounters:  02/08/22 246 lb (111.6 kg)  09/20/21 238 lb (108 kg)  08/11/21 238 lb (108 kg)    Physical Exam Constitutional:      General: He is not in acute distress.    Appearance: He is not diaphoretic.  Cardiovascular:     Rate and Rhythm: Normal rate and regular rhythm.     Heart sounds: Normal heart sounds.  Pulmonary:     Effort: Pulmonary effort is normal.     Breath sounds: Normal breath sounds.  Chest:    Skin:    General: Skin is warm and dry.  Neurological:     Mental Status: He is alert.     Assessment/Plan: Please see individual problem list.  Problem List Items Addressed This Visit     DOE  (dyspnea on exertion) (Chronic)    Patient has been evaluated by cardiology.  They felt as though his symptoms were most likely smoking/COPD related.  We will get PFTs.  I did prescribe an albuterol inhaler for him to use as needed for shortness of breath.       Relevant Orders   Pulmonary Function Test [facility] Only   Essential hypertension (Chronic)    Adequately controlled today.  He will continue losartan 100 mg daily and Lasix 20 mg daily as needed for swelling.  Check labs.       Hyperlipidemia (Chronic)    Check lipid panel.       Relevant Orders   Comp Met (CMET)   Lipid panel   Left breast mass - Primary (Chronic)    This has been an ongoing issue for at least a number of months.  Discussed my concern is for breast cancer.  We will get a mammogram and ultrasound as quickly as possible.  He was advised to contact us if he does not hear about getting this scheduled within this week.       Relevant Orders   MM DIAG BREAST TOMO BILATERAL   US BREAST LTD UNI RIGHT INC AXILLA   US BREAST LTD UNI LEFT INC AXILLA   Tobacco abuse (Chronic)    He was encouraged to quit smoking.       BPH (benign prostatic hyperplasia)    He will follow-up with urology.       Elevated hemoglobin A1c    Discussed that he would need multiple elevated A1c is to be diagnosed with diabetes.  We will check an A1c today.  He will continue to monitor his sugar intake.       Relevant Orders   HgB A1c      Return in about 4 weeks (around 03/08/2022).  I have spent 41 minutes in the care of this patient regarding history taking, documentation, chart review, completion of exam, placing orders, discussion of plan.   Tommi Rumps, MD [place]

## 2022-02-10 ENCOUNTER — Other Ambulatory Visit: Payer: Self-pay

## 2022-02-10 ENCOUNTER — Ambulatory Visit
Admission: RE | Admit: 2022-02-10 | Discharge: 2022-02-10 | Disposition: A | Discharge status: Home / Self Care | Payer: Medicare Other | Source: Ambulatory Visit | Attending: Family Medicine | Admitting: Family Medicine

## 2022-02-10 DIAGNOSIS — R7309 Other abnormal glucose: Secondary | ICD-10-CM

## 2022-02-10 DIAGNOSIS — N6325 Unspecified lump in the left breast, overlapping quadrants: Secondary | ICD-10-CM | POA: Insufficient documentation

## 2022-02-10 MED ORDER — METFORMIN HCL 500 MG PO TABS: 500.0000 mg | ORAL_TABLET | Freq: Two times a day (BID) | ORAL | 3 refills | Status: DC

## 2022-02-10 MED ORDER — BLOOD GLUCOSE MONITOR KIT: PACK | 0 refills | Status: DC

## 2022-02-14 ENCOUNTER — Other Ambulatory Visit: Payer: Self-pay | Admitting: Family Medicine

## 2022-02-14 DIAGNOSIS — N63 Unspecified lump in unspecified breast: Secondary | ICD-10-CM

## 2022-02-14 DIAGNOSIS — R928 Other abnormal and inconclusive findings on diagnostic imaging of breast: Secondary | ICD-10-CM

## 2022-02-22 ENCOUNTER — Other Ambulatory Visit: Payer: Self-pay

## 2022-02-22 ENCOUNTER — Telehealth: Payer: Self-pay

## 2022-02-22 DIAGNOSIS — R7309 Other abnormal glucose: Secondary | ICD-10-CM

## 2022-02-22 MED ORDER — BLOOD GLUCOSE MONITOR KIT: PACK | 0 refills | Status: DC

## 2022-02-22 NOTE — Telephone Encounter (Signed)
Patient's sister, [name], called to state patient spoke with Gae Bon regarding calling in a prescription for patient's meter, test strips, and lancets.  June states patient is supposed to take metFORMIN (GLUCOPHAGE) 500 MG tablet, but they have questions regarding the amount and frequency for the medication.  June also states patient is supposed to have an appointment for a breathing test, but he hasn't heard from anyone.  *June states patient's preferred pharmacy is [facility] in [place].

## 2022-02-22 NOTE — Telephone Encounter (Signed)
He is supposed to take the metformin 500 mg twice daily with meals. It should be on the prescription. I placed the PFT orders previously. I will send this to [place] to get them scheduled.

## 2022-02-23 ENCOUNTER — Ambulatory Visit
Admission: RE | Admit: 2022-02-23 | Discharge: 2022-02-23 | Disposition: A | Discharge status: Home / Self Care | Payer: Medicare Other | Source: Ambulatory Visit | Attending: Family Medicine | Admitting: Family Medicine

## 2022-02-23 DIAGNOSIS — R928 Other abnormal and inconclusive findings on diagnostic imaging of breast: Secondary | ICD-10-CM

## 2022-02-23 DIAGNOSIS — N63 Unspecified lump in unspecified breast: Secondary | ICD-10-CM

## 2022-02-23 HISTORY — PX: BREAST BIOPSY: SHX20

## 2022-02-23 NOTE — Telephone Encounter (Signed)
Noted  

## 2022-02-24 ENCOUNTER — Encounter: Payer: Self-pay | Admitting: *Deleted

## 2022-02-27 LAB — SURGICAL PATHOLOGY

## 2022-02-28 ENCOUNTER — Other Ambulatory Visit: Payer: Self-pay | Admitting: Family Medicine

## 2022-02-28 DIAGNOSIS — Z170 Estrogen receptor positive status [ER+]: Secondary | ICD-10-CM

## 2022-03-01 ENCOUNTER — Encounter: Payer: Self-pay | Admitting: *Deleted

## 2022-03-01 NOTE — Progress Notes (Signed)
Appointments have been scheduled with Dr. Jacinto Reap on 7/3 and Dr. Bary Castilla on 6/29.

## 2022-03-03 ENCOUNTER — Other Ambulatory Visit: Payer: Medicare Other

## 2022-03-03 ENCOUNTER — Ambulatory Visit: Payer: Medicare Other | Admitting: Oncology

## 2022-03-06 ENCOUNTER — Telehealth: Payer: Self-pay | Admitting: Family Medicine

## 2022-03-06 ENCOUNTER — Inpatient Hospital Stay: Payer: Medicare Other | Attending: Oncology | Admitting: Internal Medicine

## 2022-03-06 ENCOUNTER — Ambulatory Visit: Payer: Medicare Other | Admitting: Family Medicine

## 2022-03-06 ENCOUNTER — Encounter: Payer: Self-pay | Admitting: Internal Medicine

## 2022-03-06 ENCOUNTER — Inpatient Hospital Stay: Payer: Medicare Other

## 2022-03-06 DIAGNOSIS — Z7984 Long term (current) use of oral hypoglycemic drugs: Secondary | ICD-10-CM | POA: Insufficient documentation

## 2022-03-06 DIAGNOSIS — Z79899 Other long term (current) drug therapy: Secondary | ICD-10-CM | POA: Insufficient documentation

## 2022-03-06 DIAGNOSIS — F17210 Nicotine dependence, cigarettes, uncomplicated: Secondary | ICD-10-CM | POA: Insufficient documentation

## 2022-03-06 DIAGNOSIS — N400 Enlarged prostate without lower urinary tract symptoms: Secondary | ICD-10-CM | POA: Diagnosis not present

## 2022-03-06 DIAGNOSIS — Z803 Family history of malignant neoplasm of breast: Secondary | ICD-10-CM | POA: Insufficient documentation

## 2022-03-06 DIAGNOSIS — Z170 Estrogen receptor positive status [ER+]: Secondary | ICD-10-CM | POA: Diagnosis not present

## 2022-03-06 DIAGNOSIS — E119 Type 2 diabetes mellitus without complications: Secondary | ICD-10-CM | POA: Insufficient documentation

## 2022-03-06 DIAGNOSIS — C50822 Malignant neoplasm of overlapping sites of left male breast: Secondary | ICD-10-CM | POA: Diagnosis present

## 2022-03-06 DIAGNOSIS — R7309 Other abnormal glucose: Secondary | ICD-10-CM

## 2022-03-06 MED ORDER — BLOOD GLUCOSE MONITOR KIT: PACK | 0 refills | Status: DC
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
## 2022-03-06 NOTE — Telephone Encounter (Signed)
Pt sister called stating pt need refill on Glucose meter and supplies sent [facility] [place]

## 2022-03-06 NOTE — Research (Signed)
   Trial:  [employer] 2021-05 - Specimen Collection Study to Evaluate Biomarkers in Subjects with Cancer   Patient Benjamin Armstrong was identified by Benjamin Armstrong as a potential candidate for the above listed study.  This Clinical Research Nurse met with Benjamin Armstrong, [id number], on 03/06/22 in a manner and location that ensures patient privacy to discuss participation in the above listed research study.  Patient is Accompanied by his spouse .  A copy of the informed consent document and separate HIPAA Authorization was provided to the patient.  Patient reads, speaks, and understands [place].   Patient was provided with the business card of this Nurse and encouraged to contact the research team with any questions.  Approximately 20 minutes were spent with the patient reviewing the informed consent documents.  Patient was provided the option of taking informed consent documents home to review and was encouraged to review at their convenience with their support network, including other care providers. Patient took the consent documents home to review.  Patient informed Dr. Rogue Armstrong prior to the end of his visit today that he is interested in participating in the clinical trial, but does not want to be stuck with a needle more than once. Research RN will coordinate visits with the Genetics department visit to assure only one lab draw is performed at his request.   Benjamin Fruit, RN 03/06/22 3:32 PM

## 2022-03-06 NOTE — Assessment & Plan Note (Addendum)
#  cT2 clinical cN0-ER/PR positive HER2 negative breast cancer. I had a long discussion with the patient and his sister in general regarding the treatment options of breast cancer including-surgery; adjuvant radiation; role of adjuvant systemic therapy including-chemotherapy antihormone therapy.   #Again reviewed the surgical option of mastectomy with lymph node evaluation.  Patient is currently status post evaluation with Dr. Bary Castilla.  Decisions with regards to radiation depend upon postsurgery results.  Given the T2 size of the breast mass-recommend a CT scan chest and pelvis ASAP.   #With regards to systemic therapy-would recommend awaiting postsurgical pathology to decide on systemic chemotherapy.  As per NCCN guidelines Oncotype could potentially be used in male breast cancers [of course extrapolated from male breast cancer patient data.]  # Discussed the role of anti-hormonal therapy mechanism of action- recommend tamoxifen 5 to 10 years.  I would recommend tamoxifen for 5 years.  Long discussion regarding the potential adverse events on tamoxifen including but not limited to hot flashes, joint aches etc.  We will discuss further at next visit.  # DM- [newly Dx: Hb A1c 6.7- JUNE 2023]-prescribed metformin not started yet.  # Active smoker: Discussed regarding smoking cessation.  Patient not too interested.  # Prostatism on Flomax [Dr.Brandon]  # Genetics: Given male breast cancer mother with breast cancer discussed importance of genetic evaluation.  Will refer to genetics ASAP.  Discussed with genetic counselor.  # CLINICAL TRIALS: # Clinical trials: Discussed the [employer] clinical trial/which involves blood draw-and would not have any therapeutic impact on the patient's treatment.  Discussed with clinical trials nurse.  Patient is interested.  We will draw blood along with genetic labs.  Thank you Dr. Bary Castilla for allowing me to participate in the care of your pleasant patient. Please  do not hesitate to contact me with questions or concerns in the interim.  Discussed with Dr. Bary Castilla  # DISPOSITION: # HOLD labs today # referral to genetics re: male breast cancer ASAP # CT CAP ASAP # follow up TBD- Dr.B

## 2022-03-08 ENCOUNTER — Other Ambulatory Visit: Payer: Self-pay

## 2022-03-08 DIAGNOSIS — Z170 Estrogen receptor positive status [ER+]: Secondary | ICD-10-CM

## 2022-03-08 NOTE — Telephone Encounter (Signed)
Sent to patients pharmacy.  Adaleah Forget,cma
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
## 2022-03-09 ENCOUNTER — Inpatient Hospital Stay: Payer: Medicare Other

## 2022-03-09 ENCOUNTER — Encounter: Payer: Self-pay | Admitting: Licensed Clinical Social Worker

## 2022-03-09 ENCOUNTER — Encounter: Payer: Self-pay | Admitting: Emergency Medicine

## 2022-03-09 ENCOUNTER — Inpatient Hospital Stay: Payer: Medicare Other | Admitting: Licensed Clinical Social Worker

## 2022-03-09 DIAGNOSIS — Z170 Estrogen receptor positive status [ER+]: Secondary | ICD-10-CM

## 2022-03-09 DIAGNOSIS — Z803 Family history of malignant neoplasm of breast: Secondary | ICD-10-CM

## 2022-03-09 DIAGNOSIS — C50822 Malignant neoplasm of overlapping sites of left male breast: Secondary | ICD-10-CM

## 2022-03-09 DIAGNOSIS — Z801 Family history of malignant neoplasm of trachea, bronchus and lung: Secondary | ICD-10-CM

## 2022-03-09 DIAGNOSIS — Z8042 Family history of malignant neoplasm of prostate: Secondary | ICD-10-CM

## 2022-03-09 NOTE — Research (Addendum)
Trial Name:  [employer] 2021-05 - Specimen Collection Study to Evaluate Biomarkers in Subjects with Cancer   Patient Benjamin Armstrong was identified by Jeral Fruit, RN as a potential candidate for the above listed study.  This Clinical Research Nurse met with [name], [id number] on 03/09/22 in a manner and location that ensures patient privacy to discuss participation in the above listed research study.  Patient is Accompanied by his spouse .  Patient was previously provided with informed consent documents.  Patient confirmed they have read the informed consent documents.  As outlined in the informed consent form, this Nurse and [name] discussed the purpose of the research study, the investigational nature of the study, study procedures and requirements for study participation, potential risks and benefits of study participation, as well as alternatives to participation.  This study is not blinded or double-blinded. The patient understands participation is voluntary and they may withdraw from study participation at any time.  This study does not involve randomization.  This study does not involve an investigational drug or device. This study does not involve a placebo. Patient understands enrollment is pending full eligibility review.   Confidentiality and how the patient's information will be used as part of study participation were discussed.  Patient was informed there is reimbursement provided for their time and effort spent on trial participation.  The patient is encouraged to discuss research study participation with their insurance provider to determine what costs they may incur as part of study participation, including research related injury.    All questions were answered to patient's satisfaction.  The informed consent and separate HIPAA Authorization was reviewed page by page.  The patient's mental and emotional status is appropriate to provide informed consent, and the  patient verbalizes an understanding of study participation.  Patient has agreed to participate in the above listed research study and has voluntarily signed the informed consent version 3 and separate HIPAA Authorization, version 3  on 03/09/22 at 122PM.  The patient was provided with a copy of the signed informed consent form and separate HIPAA Authorization for their reference.  No study specific procedures were obtained prior to the signing of the informed consent document.  Approximately 20 minutes were spent with the patient reviewing the informed consent documents.  After obtaining informed consent patient, voluntarily signed the optional Release of Information form for use throughout trial participation.  Jeral Fruit, RN 03/09/22 1:39 PM  Eligibility: Eligibility criteria reviewed with patient. This Nurse has reviewed this patient's inclusion and exclusion criteria and confirmed patient is eligible for study participation. Eligibility confirmed by treating investigator, who also agrees that patient should proceed with enrollment. Patient will continue with enrollment. Blood Collection: Research blood obtained by Carrington Clamp via fresh venipuncture x 1 attempt. Patient tolerated well without any adverse events. Gift Card: $50 gift card given to patient for his participation in this study by [employer], [place].   Medical History:  High Blood Pressure  Yes Coronary Artery Disease No Lupus    No Rheumatoid Arthritis  No Diabetes   No      If yes, which type?       Lynch Syndrome  No  Is the patient currently taking a magnesium supplement?   No If yes, dose and frequency?  Indication?  Start date?   Does the patient have a personal history of cancer (greater than 5 years ago)?  Yes If yes, Cancer type and date of diagnosis?   Basal skin cancer  Has this previous diagnosis been treated? Yes      If so, treatment type? Surgery, removal from his face   Start and end dates of last  treatment cycle? 12 years ago, 2011  Does the patient have a family history of cancer in 1st or 2nd degree relatives? Yes If yes, Relationship(s) and Cancer type(s)? Mother- Breast / Uterine, Father- Prostate, Brother- skin   Does the patient have history of alcohol consumption? Yes   If yes, current or former? former If former, year stopped? 2000 Number of years? 12  Drinks per week? Did not answer  Does the patient have history of cigarette, cigar, pipe, or chewing tobacco use?  Yes  If yes, current for former? Current If yes, type (Cigarette, cigar, pipe, and/or chewing tobacco)? Cigaretter   If former, year stopped?  Number of years? 53 Packs/number/containers per day? [place], RN 03/09/22 4:39 PM

## 2022-03-09 NOTE — Research (Signed)
[employer] 2021-05 - Specimen Collection Study to Evaluate Biomarkers in Subjects with Cancer   03/09/22  This Coordinator has reviewed this patient's inclusion and exclusion criteria and confirmed Benjamin Armstrong is eligible for study participation.  Patient will continue with enrollment.  Clabe Seal Clinical Research Coordinator I  03/09/22 3:04 PM

## 2022-03-10 ENCOUNTER — Other Ambulatory Visit: Payer: Self-pay | Admitting: General Surgery

## 2022-03-10 DIAGNOSIS — Z170 Estrogen receptor positive status [ER+]: Secondary | ICD-10-CM

## 2022-03-11 ENCOUNTER — Other Ambulatory Visit: Payer: Self-pay | Admitting: Urology

## 2022-03-13 ENCOUNTER — Ambulatory Visit
Admission: RE | Admit: 2022-03-13 | Discharge: 2022-03-13 | Disposition: A | Discharge status: Home / Self Care | Payer: Medicare Other | Source: Ambulatory Visit | Attending: Internal Medicine | Admitting: Internal Medicine

## 2022-03-13 DIAGNOSIS — C50822 Malignant neoplasm of overlapping sites of left male breast: Secondary | ICD-10-CM | POA: Insufficient documentation

## 2022-03-13 DIAGNOSIS — Z170 Estrogen receptor positive status [ER+]: Secondary | ICD-10-CM | POA: Diagnosis present

## 2022-03-13 HISTORY — DX: Essential (primary) hypertension: I10

## 2022-03-13 MED ORDER — IOHEXOL 300 MG/ML  SOLN
100.0000 mL | Freq: Once | INTRAMUSCULAR | Status: AC | PRN
  Administered 2022-03-13: 100 mL via INTRAVENOUS

## 2022-03-14 ENCOUNTER — Encounter: Payer: Self-pay | Admitting: *Deleted

## 2022-03-14 NOTE — Progress Notes (Signed)
Mastectomy scheduled with Dr. Bary Castilla on 03/20/22.  Patient to see Dr. B 2 weeks later on 04/06/22.   Patient is aware of date and time of appt.

## 2022-03-15 ENCOUNTER — Other Ambulatory Visit: Payer: Self-pay | Admitting: General Surgery
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
## 2022-03-16 ENCOUNTER — Encounter
Admission: RE | Admit: 2022-03-16 | Discharge: 2022-03-16 | Disposition: A | Discharge status: Home / Self Care | Payer: Medicare Other | Source: Ambulatory Visit | Attending: General Surgery | Admitting: General Surgery

## 2022-03-16 ENCOUNTER — Encounter: Payer: Self-pay | Admitting: General Surgery

## 2022-03-16 DIAGNOSIS — Z79899 Other long term (current) drug therapy: Secondary | ICD-10-CM

## 2022-03-16 DIAGNOSIS — E1159 Type 2 diabetes mellitus with other circulatory complications: Secondary | ICD-10-CM

## 2022-03-16 DIAGNOSIS — Z01818 Encounter for other preprocedural examination: Secondary | ICD-10-CM

## 2022-03-16 HISTORY — DX: Tobacco use: Z72.0

## 2022-03-16 HISTORY — DX: Personal history of other diseases of the digestive system: Z87.19

## 2022-03-16 HISTORY — DX: Unspecified osteoarthritis, unspecified site: M19.90

## 2022-03-16 HISTORY — DX: Chronic obstructive pulmonary disease, unspecified: J44.9

## 2022-03-16 HISTORY — DX: Malignant neoplasm of overlapping sites of left male breast: C50.822

## 2022-03-16 HISTORY — DX: Type 2 diabetes mellitus without complications: E11.9

## 2022-03-16 HISTORY — DX: Estrogen receptor positive status (ER+): Z17.0

## 2022-03-16 HISTORY — DX: Dyspnea, unspecified: R06.00

## 2022-03-16 HISTORY — DX: Other abnormal glucose: R73.09

## 2022-03-16 HISTORY — DX: Benign prostatic hyperplasia without lower urinary tract symptoms: N40.0

## 2022-03-16 HISTORY — DX: Family history of other specified conditions: Z84.89

## 2022-03-16 NOTE — Patient Instructions (Addendum)
Your procedure is scheduled on:03-20-22 Monday Report to the Registration Desk on the 1st floor of the [facility].Then proceed to the Radiology Desk (2nd desk on right) Arrive @ 9:45 AM  REMEMBER: Instructions that are not followed completely may result in serious medical risk, up to and including death; or upon the discretion of your surgeon and anesthesiologist your surgery may need to be rescheduled.  Do not eat food after midnight the night before surgery.  No gum chewing, lozengers or hard candies.  You may however, drink Water up to 2 hours before you are scheduled to arrive for your surgery. Do not drink anything within 2 hours of your scheduled arrival time.  TAKE THESE MEDICATIONS THE MORNING OF SURGERY WITH A SIP OF WATER: -Famotidine (PEPCID AC) -take one the night before and one on the morning of surgery - helps to prevent nausea after surgery.)  Use your albuterol (VENTOLIN HFA) Inhaler the day of surgery and bring your inhaler to the hospital  One week prior to surgery: Stop Anti-inflammatories (NSAIDS) such as Advil, Aleve, Ibuprofen, Motrin, Naproxen, Naprosyn and Aspirin based products such as Excedrin, Goodys Powder, BC Powder.You may however, take Tylenol if needed for pain up until the day of surgery.  Stop ANY OVER THE COUNTER supplements/vitamins NOW (03-16-22) until after surgery.  No Alcohol for 24 hours before or after surgery.  No Smoking including e-cigarettes for 24 hours prior to surgery.  No chewable tobacco products for at least 6 hours prior to surgery.  No nicotine patches on the day of surgery.  Do not use any "recreational" drugs for at least a week prior to your surgery.  Please be advised that the combination of cocaine and anesthesia may have negative outcomes, up to and including death. If you test positive for cocaine, your surgery will be cancelled.  On the morning of surgery brush your teeth with toothpaste and water, you may rinse your mouth  with mouthwash if you wish. Do not swallow any toothpaste or mouthwash.  Use CHG Soap as directed on instruction sheet.  Do not wear jewelry, make-up, hairpins, clips or nail polish.  Do not wear lotions, powders, or perfumes.   Do not shave body from the neck down 48 hours prior to surgery just in case you cut yourself which could leave a site for infection.  Also, freshly shaved skin may become irritated if using the CHG soap.  Contact lenses, hearing aids and dentures may not be worn into surgery.  Do not bring valuables to the hospital. [facility] is not responsible for any missing/lost belongings or valuables.  Notify your doctor if there is any change in your medical condition (cold, fever, infection).  Wear comfortable clothing (specific to your surgery type) to the hospital.  After surgery, you can help prevent lung complications by doing breathing exercises.  Take deep breaths and cough every 1-2 hours. Your doctor may order a device called an Incentive Spirometer to help you take deep breaths. When coughing or sneezing, hold a pillow firmly against your incision with both hands. This is called "splinting." Doing this helps protect your incision. It also decreases belly discomfort.  If you are being admitted to the hospital overnight, leave your suitcase in the car. After surgery it may be brought to your room.  If you are being discharged the day of surgery, you will not be allowed to drive home. You will need a responsible adult (18 years or older) to drive you home and stay  with you that night.   If you are taking public transportation, you will need to have a responsible adult (18 years or older) with you. Please confirm with your physician that it is acceptable to use public transportation.   Please call the [facility] Dept. at [phone] if you have any questions about these instructions.  Surgery Visitation Policy:  Patients undergoing a surgery  or procedure may have two family members or support persons with them as long as the person is not COVID-19 positive or experiencing its symptoms.

## 2022-03-16 NOTE — Patient Instructions (Signed)
Your procedure is scheduled on:7-1 Report to the Registration Desk on the 1st floor of the [facility]. To find out your arrival time, please call [phone] between 1PM - 3PM on: If your arrival time is 6:00 am, do not arrive prior to that time as the [facility] entrance doors do not open until 6:00 am.  REMEMBER: Instructions that are not followed completely may result in serious medical risk, up to and including death; or upon the discretion of your surgeon and anesthesiologist your surgery may need to be rescheduled.  Do not eat food after midnight the night before surgery.  No gum chewing, lozengers or hard candies.  You may however, drink CLEAR liquids up to 2 hours before you are scheduled to arrive for your surgery. Do not drink anything within 2 hours of your scheduled arrival time.  Clear liquids include: - water  - apple juice without pulp - gatorade (not RED colors) - black coffee or tea (Do NOT add milk or creamers to the coffee or tea) Do NOT drink anything that is not on this list.  Type 1 and Type 2 diabetics should only drink water.  In addition, your doctor has ordered for you to drink the provided  Ensure Pre-Surgery Clear Carbohydrate Drink  Gatorade G2 Drinking this carbohydrate drink up to two hours before surgery helps to reduce insulin resistance and improve patient outcomes. Please complete drinking 2 hours prior to scheduled arrival time.  TAKE THESE MEDICATIONS THE MORNING OF SURGERY WITH A SIP OF WATER:  (take one the night before and one on the morning of surgery - helps to prevent nausea after surgery.)  Use inhalers on the day of surgery and bring to the hospital.  **Follow new guidelines for insulin and diabetes medications.**  Follow recommendations from Cardiologist, Pulmonologist or PCP regarding stopping Aspirin, Coumadin, Plavix, Eliquis, Pradaxa, or Pletal.  One week prior to surgery: Stop Anti-inflammatories (NSAIDS) such as Advil,  Aleve, Ibuprofen, Motrin, Naproxen, Naprosyn and Aspirin based products such as Excedrin, Goodys Powder, BC Powder. Stop ANY OVER THE COUNTER supplements until after surgery. You may however, continue to take Tylenol if needed for pain up until the day of surgery.  No Alcohol for 24 hours before or after surgery.  No Smoking including e-cigarettes for 24 hours prior to surgery.  No chewable tobacco products for at least 6 hours prior to surgery.  No nicotine patches on the day of surgery.  Do not use any "recreational" drugs for at least a week prior to your surgery.  Please be advised that the combination of cocaine and anesthesia may have negative outcomes, up to and including death. If you test positive for cocaine, your surgery will be cancelled.  On the morning of surgery brush your teeth with toothpaste and water, you may rinse your mouth with mouthwash if you wish. Do not swallow any toothpaste or mouthwash.  Use CHG Soap or wipes as directed on instruction sheet.  Do not wear jewelry, make-up, hairpins, clips or nail polish.  Do not wear lotions, powders, or perfumes.   Do not shave body from the neck down 48 hours prior to surgery just in case you cut yourself which could leave a site for infection.  Also, freshly shaved skin may become irritated if using the CHG soap.  Contact lenses, hearing aids and dentures may not be worn into surgery.  Do not bring valuables to the hospital. [facility] is not responsible for any missing/lost belongings or valuables.  Total Shoulder Arthroplasty:  use Benzolyl Peroxide 5% Gel as directed on instruction sheet.  Fleets enema or bowel prep as directed.  Bring your C-PAP to the hospital with you in case you may have to spend the night.   Notify your doctor if there is any change in your medical condition (cold, fever, infection).  Wear comfortable clothing (specific to your surgery type) to the hospital.  After surgery, you can  help prevent lung complications by doing breathing exercises.  Take deep breaths and cough every 1-2 hours. Your doctor may order a device called an Incentive Spirometer to help you take deep breaths. When coughing or sneezing, hold a pillow firmly against your incision with both hands. This is called "splinting." Doing this helps protect your incision. It also decreases belly discomfort.  If you are being admitted to the hospital overnight, leave your suitcase in the car. After surgery it may be brought to your room.  If you are being discharged the day of surgery, you will not be allowed to drive home. You will need a responsible adult (18 years or older) to drive you home and stay with you that night.   If you are taking public transportation, you will need to have a responsible adult (18 years or older) with you. Please confirm with your physician that it is acceptable to use public transportation.   Please call the [facility] Dept. at [phone] if you have any questions about these instructions.  Surgery Visitation Policy:  Patients undergoing a surgery or procedure may have two family members or support persons with them as long as the person is not COVID-19 positive or experiencing its symptoms.   Inpatient Visitation:    Visiting hours are 7 a.m. to 8 p.m. Up to four visitors are allowed at one time in a patient room, including children. The visitors may rotate out with other people during the day. One designated support person (adult) may remain overnight.

## 2022-03-17 ENCOUNTER — Inpatient Hospital Stay: Admission: RE | Admit: 2022-03-17 | Payer: Medicare Other | Source: Ambulatory Visit

## 2022-03-17 ENCOUNTER — Encounter
Admission: RE | Admit: 2022-03-17 | Discharge: 2022-03-17 | Disposition: A | Discharge status: Home / Self Care | Payer: Medicare Other | Source: Ambulatory Visit | Attending: General Surgery | Admitting: General Surgery

## 2022-03-17 DIAGNOSIS — Z79899 Other long term (current) drug therapy: Secondary | ICD-10-CM | POA: Insufficient documentation

## 2022-03-17 DIAGNOSIS — Z01818 Encounter for other preprocedural examination: Secondary | ICD-10-CM | POA: Diagnosis present

## 2022-03-17 DIAGNOSIS — E1159 Type 2 diabetes mellitus with other circulatory complications: Secondary | ICD-10-CM | POA: Diagnosis not present

## 2022-03-17 DIAGNOSIS — I152 Hypertension secondary to endocrine disorders: Secondary | ICD-10-CM | POA: Insufficient documentation

## 2022-03-17 LAB — CBC
HCT: 45.2 % (ref 39.0–52.0)
Hemoglobin: 14.2 g/dL (ref 13.0–17.0)
MCH: 28.7 pg (ref 26.0–34.0)
MCHC: 31.4 g/dL (ref 30.0–36.0)
MCV: 91.3 fL (ref 80.0–100.0)
Platelets: 200 10*3/uL (ref 150–400)
RBC: 4.95 MIL/uL (ref 4.22–5.81)
RDW: 13.2 % (ref 11.5–15.5)
WBC: 9.7 10*3/uL (ref 4.0–10.5)
nRBC: 0 % (ref 0.0–0.2)

## 2022-03-17 LAB — POTASSIUM: Potassium: 4.4 mmol/L (ref 3.5–5.1)

## 2022-03-20 ENCOUNTER — Ambulatory Visit: Payer: Medicare Other | Admitting: Urgent Care

## 2022-03-20 ENCOUNTER — Ambulatory Visit
Admission: RE | Admit: 2022-03-20 | Discharge: 2022-03-20 | Disposition: A | Discharge status: Home / Self Care | Payer: Medicare Other | Source: Ambulatory Visit | Attending: General Surgery | Admitting: General Surgery

## 2022-03-20 ENCOUNTER — Ambulatory Visit: Payer: Medicare Other | Admitting: Internal Medicine

## 2022-03-20 ENCOUNTER — Encounter
Admission: RE | Disposition: A | Discharge status: Home / Self Care | Payer: Self-pay | Source: Home / Self Care | Attending: General Surgery

## 2022-03-20 ENCOUNTER — Encounter: Payer: Self-pay | Admitting: General Surgery

## 2022-03-20 ENCOUNTER — Other Ambulatory Visit: Payer: Self-pay

## 2022-03-20 ENCOUNTER — Ambulatory Visit
Admission: RE | Admit: 2022-03-20 | Discharge: 2022-03-20 | Disposition: A | Discharge status: Home / Self Care | Payer: Medicare Other | Attending: General Surgery | Admitting: General Surgery

## 2022-03-20 DIAGNOSIS — I10 Essential (primary) hypertension: Secondary | ICD-10-CM | POA: Diagnosis not present

## 2022-03-20 DIAGNOSIS — N62 Hypertrophy of breast: Secondary | ICD-10-CM | POA: Insufficient documentation

## 2022-03-20 DIAGNOSIS — E119 Type 2 diabetes mellitus without complications: Secondary | ICD-10-CM | POA: Diagnosis not present

## 2022-03-20 DIAGNOSIS — C50822 Malignant neoplasm of overlapping sites of left male breast: Secondary | ICD-10-CM | POA: Diagnosis present

## 2022-03-20 DIAGNOSIS — Z170 Estrogen receptor positive status [ER+]: Secondary | ICD-10-CM | POA: Diagnosis not present

## 2022-03-20 DIAGNOSIS — C773 Secondary and unspecified malignant neoplasm of axilla and upper limb lymph nodes: Secondary | ICD-10-CM | POA: Diagnosis not present

## 2022-03-20 DIAGNOSIS — L821 Other seborrheic keratosis: Secondary | ICD-10-CM | POA: Insufficient documentation

## 2022-03-20 DIAGNOSIS — N400 Enlarged prostate without lower urinary tract symptoms: Secondary | ICD-10-CM | POA: Insufficient documentation

## 2022-03-20 DIAGNOSIS — J449 Chronic obstructive pulmonary disease, unspecified: Secondary | ICD-10-CM | POA: Diagnosis not present

## 2022-03-20 DIAGNOSIS — Z01818 Encounter for other preprocedural examination: Secondary | ICD-10-CM

## 2022-03-20 DIAGNOSIS — F17210 Nicotine dependence, cigarettes, uncomplicated: Secondary | ICD-10-CM | POA: Diagnosis not present

## 2022-03-20 HISTORY — PX: BREAST BIOPSY: SHX20

## 2022-03-20 HISTORY — PX: SIMPLE MASTECTOMY WITH AXILLARY SENTINEL NODE BIOPSY: SHX6098

## 2022-03-20 LAB — GLUCOSE, CAPILLARY
Glucose-Capillary: 114 mg/dL — ABNORMAL HIGH (ref 70–99)
Glucose-Capillary: 143 mg/dL — ABNORMAL HIGH (ref 70–99)

## 2022-03-20 SURGERY — SIMPLE MASTECTOMY WITH AXILLARY SENTINEL NODE BIOPSY
Anesthesia: General | Laterality: Right

## 2022-03-20 MED ORDER — ONDANSETRON HCL 4 MG/2ML IJ SOLN
INTRAMUSCULAR | Status: DC | PRN
  Administered 2022-03-20: 4 mg via INTRAVENOUS

## 2022-03-20 MED ORDER — CEFAZOLIN SODIUM-DEXTROSE 2-4 GM/100ML-% IV SOLN
2.0000 g | INTRAVENOUS | Status: AC
  Administered 2022-03-20: 2 g via INTRAVENOUS

## 2022-03-20 MED ORDER — IPRATROPIUM-ALBUTEROL 0.5-2.5 (3) MG/3ML IN SOLN
RESPIRATORY_TRACT | Status: AC
  Filled 2022-03-20: qty 3

## 2022-03-20 MED ORDER — LIDOCAINE HCL (CARDIAC) PF 100 MG/5ML IV SOSY
PREFILLED_SYRINGE | INTRAVENOUS | Status: DC | PRN
  Administered 2022-03-20: 60 mg via INTRAVENOUS

## 2022-03-20 MED ORDER — FAMOTIDINE 20 MG PO TABS: 20.0000 mg | ORAL_TABLET | Freq: Once | ORAL | Status: DC

## 2022-03-20 MED ORDER — METHYLENE BLUE 1 % INJ SOLN
INTRAVENOUS | Status: AC
  Filled 2022-03-20: qty 10

## 2022-03-20 MED ORDER — DEXAMETHASONE SODIUM PHOSPHATE 10 MG/ML IJ SOLN
INTRAMUSCULAR | Status: AC
  Filled 2022-03-20: qty 1

## 2022-03-20 MED ORDER — SEVOFLURANE IN SOLN
RESPIRATORY_TRACT | Status: AC
  Filled 2022-03-20: qty 250

## 2022-03-20 MED ORDER — KETOROLAC TROMETHAMINE 30 MG/ML IJ SOLN
INTRAMUSCULAR | Status: AC
  Filled 2022-03-20: qty 1

## 2022-03-20 MED ORDER — SODIUM CHLORIDE 0.9 % IV SOLN: INTRAVENOUS | Status: DC

## 2022-03-20 MED ORDER — ONDANSETRON HCL 4 MG/2ML IJ SOLN
INTRAMUSCULAR | Status: AC
  Filled 2022-03-20: qty 2

## 2022-03-20 MED ORDER — HYDROCODONE-ACETAMINOPHEN 5-325 MG PO TABS: 1.0000 | ORAL_TABLET | ORAL | 0 refills | Status: DC | PRN

## 2022-03-20 MED ORDER — ACETAMINOPHEN 10 MG/ML IV SOLN
INTRAVENOUS | Status: AC
  Filled 2022-03-20: qty 100

## 2022-03-20 MED ORDER — DEXMEDETOMIDINE HCL IN NACL 200 MCG/50ML IV SOLN
INTRAVENOUS | Status: DC | PRN
  Administered 2022-03-20: 8 ug via INTRAVENOUS
  Administered 2022-03-20: 12 ug via INTRAVENOUS
  Administered 2022-03-20: 8 ug via INTRAVENOUS

## 2022-03-20 MED ORDER — CHLORHEXIDINE GLUCONATE CLOTH 2 % EX PADS: 6.0000 | MEDICATED_PAD | Freq: Once | CUTANEOUS | Status: DC

## 2022-03-20 MED ORDER — GLYCOPYRROLATE 0.2 MG/ML IJ SOLN
INTRAMUSCULAR | Status: AC
  Filled 2022-03-20: qty 1

## 2022-03-20 MED ORDER — LIDOCAINE HCL (PF) 2 % IJ SOLN
INTRAMUSCULAR | Status: AC
  Filled 2022-03-20: qty 5

## 2022-03-20 MED ORDER — PROPOFOL 10 MG/ML IV BOLUS
INTRAVENOUS | Status: AC
  Filled 2022-03-20: qty 20

## 2022-03-20 MED ORDER — FENTANYL CITRATE (PF) 100 MCG/2ML IJ SOLN: 25.0000 ug | INTRAMUSCULAR | Status: DC | PRN

## 2022-03-20 MED ORDER — CHLORHEXIDINE GLUCONATE 0.12 % MT SOLN
OROMUCOSAL | Status: AC
  Administered 2022-03-20: 15 mL via OROMUCOSAL
  Filled 2022-03-20: qty 15

## 2022-03-20 MED ORDER — ORAL CARE MOUTH RINSE: 15.0000 mL | Freq: Once | OROMUCOSAL | Status: AC

## 2022-03-20 MED ORDER — METHYLENE BLUE 1 % INJ SOLN
INTRAVENOUS | Status: DC | PRN
  Administered 2022-03-20: 3 mL via INTRADERMAL

## 2022-03-20 MED ORDER — DEXAMETHASONE SODIUM PHOSPHATE 10 MG/ML IJ SOLN
INTRAMUSCULAR | Status: DC | PRN
  Administered 2022-03-20: 5 mg via INTRAVENOUS

## 2022-03-20 MED ORDER — CEFAZOLIN SODIUM-DEXTROSE 2-4 GM/100ML-% IV SOLN
INTRAVENOUS | Status: AC
  Filled 2022-03-20: qty 100

## 2022-03-20 MED ORDER — ONDANSETRON HCL 4 MG/2ML IJ SOLN: 4.0000 mg | Freq: Once | INTRAMUSCULAR | Status: DC | PRN

## 2022-03-20 MED ORDER — BUPIVACAINE-EPINEPHRINE (PF) 0.5% -1:200000 IJ SOLN
INTRAMUSCULAR | Status: DC | PRN
  Administered 2022-03-20: 30 mL

## 2022-03-20 MED ORDER — PROPOFOL 10 MG/ML IV BOLUS
INTRAVENOUS | Status: DC | PRN
  Administered 2022-03-20: 150 mg via INTRAVENOUS
  Administered 2022-03-20 (×3): 50 mg via INTRAVENOUS

## 2022-03-20 MED ORDER — FENTANYL CITRATE (PF) 100 MCG/2ML IJ SOLN
INTRAMUSCULAR | Status: AC
  Filled 2022-03-20: qty 2

## 2022-03-20 MED ORDER — GLYCOPYRROLATE 0.2 MG/ML IJ SOLN
INTRAMUSCULAR | Status: DC | PRN
  Administered 2022-03-20: .2 mg via INTRAVENOUS

## 2022-03-20 MED ORDER — KETOROLAC TROMETHAMINE 30 MG/ML IJ SOLN
INTRAMUSCULAR | Status: DC | PRN
  Administered 2022-03-20: 30 mg via INTRAVENOUS

## 2022-03-20 MED ORDER — TECHNETIUM TC 99M TILMANOCEPT KIT
1.0000 | PACK | Freq: Once | INTRAVENOUS | Status: AC | PRN
  Administered 2022-03-20: 1 via INTRADERMAL

## 2022-03-20 MED ORDER — ACETAMINOPHEN 10 MG/ML IV SOLN
INTRAVENOUS | Status: DC | PRN
  Administered 2022-03-20: 1000 mg via INTRAVENOUS

## 2022-03-20 MED ORDER — FENTANYL CITRATE (PF) 100 MCG/2ML IJ SOLN
INTRAMUSCULAR | Status: DC | PRN
  Administered 2022-03-20 (×2): 50 ug via INTRAVENOUS

## 2022-03-20 MED ORDER — SUCCINYLCHOLINE CHLORIDE 200 MG/10ML IV SOSY
PREFILLED_SYRINGE | INTRAVENOUS | Status: DC | PRN
  Administered 2022-03-20: 120 mg via INTRAVENOUS

## 2022-03-20 MED ORDER — FAMOTIDINE 20 MG PO TABS
ORAL_TABLET | ORAL | Status: AC
  Filled 2022-03-20: qty 1

## 2022-03-20 MED ORDER — BUPIVACAINE-EPINEPHRINE (PF) 0.5% -1:200000 IJ SOLN
INTRAMUSCULAR | Status: AC
Start: 2022-03-20 — End: ?
  Filled 2022-03-20: qty 30

## 2022-03-20 MED ORDER — IPRATROPIUM-ALBUTEROL 0.5-2.5 (3) MG/3ML IN SOLN
3.0000 mL | Freq: Once | RESPIRATORY_TRACT | Status: AC
  Administered 2022-03-20: 3 mL via RESPIRATORY_TRACT

## 2022-03-20 MED ORDER — CHLORHEXIDINE GLUCONATE 0.12 % MT SOLN: 15.0000 mL | Freq: Once | OROMUCOSAL | Status: AC

## 2022-03-20 SURGICAL SUPPLY — 70 items
APL PRP STRL LF DISP 70% ISPRP (MISCELLANEOUS) ×4
APPLIER CLIP 11 MED OPEN (CLIP)
APPLIER CLIP 13 LRG OPEN (CLIP)
APR CLP LRG 13 20 CLIP (CLIP)
APR CLP MED 11 20 MLT OPN (CLIP)
BLADE PHOTON ILLUMINATED (MISCELLANEOUS) ×1 IMPLANT
BLADE SURG 15 STRL SS SAFETY (BLADE) ×6 IMPLANT
BULB RESERV EVAC DRAIN JP 100C (MISCELLANEOUS) IMPLANT
CHLORAPREP W/TINT 26 (MISCELLANEOUS) ×4 IMPLANT
CLIP APPLIE 11 MED OPEN (CLIP) IMPLANT
CLIP APPLIE 13 LRG OPEN (CLIP) IMPLANT
CNTNR SPEC 2.5X3XGRAD LEK (MISCELLANEOUS) ×4
CONT SPEC 4OZ STER OR WHT (MISCELLANEOUS) ×2
CONT SPEC 4OZ STRL OR WHT (MISCELLANEOUS) ×4
CONTAINER SPEC 2.5X3XGRAD LEK (MISCELLANEOUS) ×6 IMPLANT
COVER PROBE FLX POLY STRL (MISCELLANEOUS) ×3 IMPLANT
DEVICE DUBIN SPECIMEN MAMMOGRA (MISCELLANEOUS) ×2 IMPLANT
DRAIN CHANNEL JP 15F RND 16 (MISCELLANEOUS) IMPLANT
DRAPE LAPAROTOMY 100X77 ABD (DRAPES) ×3 IMPLANT
DRAPE LAPAROTOMY TRNSV 106X77 (MISCELLANEOUS) ×3 IMPLANT
DRSG GAUZE FLUFF 36X18 (GAUZE/BANDAGES/DRESSINGS) ×4 IMPLANT
DRSG TELFA 3X8 NADH (GAUZE/BANDAGES/DRESSINGS) ×3 IMPLANT
DRSG TELFA 4X3 1S NADH ST (GAUZE/BANDAGES/DRESSINGS) ×3 IMPLANT
ELECT CAUTERY BLADE TIP 2.5 (TIP) ×3
ELECT REM PT RETURN 9FT ADLT (ELECTROSURGICAL) ×3
ELECTRODE CAUTERY BLDE TIP 2.5 (TIP) ×2 IMPLANT
ELECTRODE REM PT RTRN 9FT ADLT (ELECTROSURGICAL) ×2 IMPLANT
GAUZE 4X4 16PLY ~~LOC~~+RFID DBL (SPONGE) ×3 IMPLANT
GLOVE BIO SURGEON STRL SZ7.5 (GLOVE) ×7 IMPLANT
GLOVE SURG UNDER LTX SZ8 (GLOVE) ×4 IMPLANT
GOWN STRL REUS W/ TWL LRG LVL3 (GOWN DISPOSABLE) ×4 IMPLANT
GOWN STRL REUS W/TWL LRG LVL3 (GOWN DISPOSABLE) ×9
KIT TURNOVER KIT A (KITS) ×3 IMPLANT
LABEL OR SOLS (LABEL) ×3 IMPLANT
MANIFOLD NEPTUNE II (INSTRUMENTS) ×3 IMPLANT
MARGIN MAP 10MM (MISCELLANEOUS) ×2 IMPLANT
NDL HYPO 25X1 1.5 SAFETY (NEEDLE) ×2 IMPLANT
NEEDLE HYPO 22GX1.5 SAFETY (NEEDLE) ×3 IMPLANT
NEEDLE HYPO 25X1 1.5 SAFETY (NEEDLE) ×3 IMPLANT
PACK BASIN MINOR ARMC (MISCELLANEOUS) ×3 IMPLANT
PAD DRESSING TELFA 3X8 NADH (GAUZE/BANDAGES/DRESSINGS) ×2 IMPLANT
PENCIL ELECTRO HAND CTR (MISCELLANEOUS) ×3 IMPLANT
PIN SAFETY STRL (MISCELLANEOUS) ×2 IMPLANT
RETRACTOR RING XSMALL (MISCELLANEOUS) IMPLANT
RTRCTR WOUND ALEXIS 13CM XS SH (MISCELLANEOUS)
SHEARS FOC LG CVD HARMONIC 17C (MISCELLANEOUS) IMPLANT
SHEARS HARMONIC 9CM CVD (BLADE) IMPLANT
SLEVE PROBE SENORX GAMMA FIND (MISCELLANEOUS) ×3 IMPLANT
SPONGE T-LAP 18X18 ~~LOC~~+RFID (SPONGE) ×3 IMPLANT
STAPLER SKIN PROX 35W (STAPLE) ×1 IMPLANT
STRIP CLOSURE SKIN 1/2X4 (GAUZE/BANDAGES/DRESSINGS) ×6 IMPLANT
SUT ETHILON 3-0 FS-10 30 BLK (SUTURE) ×3
SUT SILK 2 0 (SUTURE) ×3
SUT SILK 2-0 30XBRD TIE 12 (SUTURE) ×2 IMPLANT
SUT SILK 3 0 (SUTURE) ×3
SUT SILK 3-0 18XBRD TIE 12 (SUTURE) ×2 IMPLANT
SUT VIC AB 2-0 CT1 27 (SUTURE) ×12
SUT VIC AB 2-0 CT1 TAPERPNT 27 (SUTURE) ×8 IMPLANT
SUT VIC AB 3-0 54X BRD REEL (SUTURE) ×4 IMPLANT
SUT VIC AB 3-0 BRD 54 (SUTURE) ×6
SUT VIC AB 3-0 SH 27 (SUTURE) ×3
SUT VIC AB 3-0 SH 27X BRD (SUTURE) ×2 IMPLANT
SUT VIC AB 4-0 FS2 27 (SUTURE) ×3 IMPLANT
SUTURE EHLN 3-0 FS-10 30 BLK (SUTURE) ×2 IMPLANT
SWABSTK COMLB BENZOIN TINCTURE (MISCELLANEOUS) ×3 IMPLANT
SYR 10ML LL (SYRINGE) ×3 IMPLANT
TAPE TRANSPORE STRL 2 31045 (GAUZE/BANDAGES/DRESSINGS) ×3 IMPLANT
TRAP NEPTUNE SPECIMEN COLLECT (MISCELLANEOUS) ×3 IMPLANT
WATER STERILE IRR 1000ML POUR (IV SOLUTION) ×3 IMPLANT
WATER STERILE IRR 500ML POUR (IV SOLUTION) ×3 IMPLANT

## 2022-03-20 NOTE — Anesthesia Procedure Notes (Signed)
Procedure Name: Intubation Date/Time: 03/20/2022 12:39 PM  Performed by: Tollie Eth, CRNAPre-anesthesia Checklist: Patient identified, Patient being monitored, Timeout performed, Emergency Drugs available and Suction available Patient Re-evaluated:Patient Re-evaluated prior to induction Oxygen Delivery Method: Circle system utilized Preoxygenation: Pre-oxygenation with 100% oxygen Induction Type: IV induction and Rapid sequence Laryngoscope Size: McGraph and 4 Grade View: Grade I Tube type: Oral Tube size: 7.5 mm Number of attempts: 1 Airway Equipment and Method: Stylet and Video-laryngoscopy Placement Confirmation: ETT inserted through vocal cords under direct vision, positive ETCO2 and breath sounds checked- equal and bilateral Secured at: 21 cm Tube secured with: Tape Dental Injury: Teeth and Oropharynx as per pre-operative assessment  Difficulty Due To: Difficulty was anticipated Comments: LMA #5 would not seat successfully. LMA removed and EET placed.

## 2022-03-20 NOTE — Transfer of Care (Signed)
Immediate Anesthesia Transfer of Care Note  Patient: Benjamin Armstrong  Procedure(s) Performed: SIMPLE MASTECTOMY WITH AXILLARY SENTINEL NODE BIOPSY (Left) BREAST BIOPSY (Right)  Patient Location: PACU  Anesthesia Type:General  Level of Consciousness: drowsy and patient cooperative  Airway & Oxygen Therapy: Patient Spontanous Breathing and Patient connected to face mask oxygen  Post-op Assessment: Report given to RN and Post -op Vital signs reviewed and stable  Post vital signs: Reviewed and stable  Last Vitals:  Vitals Value Taken Time  BP 148/62 03/20/22 1516  Temp 36.2 C 03/20/22 1516  Pulse 82 03/20/22 1520  Resp 19 03/20/22 1520  SpO2 97 % 03/20/22 1520  Vitals shown include unvalidated device data.  Last Pain:  Vitals:   03/20/22 1516  TempSrc:   PainSc: Asleep         Complications:  Encounter Notable Events  Notable Event Outcome Phase Comment  Difficult to intubate - expected  Intraprocedure Filed from anesthesia note documentation.

## 2022-03-20 NOTE — Op Note (Signed)
Preoperative diagnosis 1) left breast cancer; 2) gynecomastia right breast.  Postoperative diagnosis: Same.  Operative procedure: 1) ultrasound-guided core biopsy of the right breast; 2) left mastectomy with sentinel lymph node biopsy.  Operating surgeon: Hervey Ard, MD.  Assistant: Elsie Stain, RNFA.  Anesthesia: General endotracheal; Marcaine 0.5% with 1: 200,000 units of epinephrine, 30 cc pectoral block.  Estimated blood loss: 30 cc.  Clinical note: This 66 year old male has been identified with left breast cancer with an extensive component from the retroareolar area into the 6 o'clock position just above the inframammary fold.  Preoperative CT suggested gynecomastia on the right.  He is felt to be a candidate for core biopsy of the right and left simple mastectomy with sentinel node biopsy.  He underwent injection with technetium sulfur colloid the morning of the procedure.  He did report significant discomfort with the injection.  He received Ancef intravenously.  SCD stockings for DVT prevention.  Operative note: Patient initially underwent anesthesia with an LMA.  This was found to be inadequate and subsequent changes endotracheal tube.  3 cc of 0.5% methylene blue was injected in the left subareolar plexus.  The left chest and right breast were cleansed with ChloraPrep and draped.  Ultrasound of the retroareolar area was completed and a 1-1/2 cm hypodense area was noted corresponding to the CT findings.  Under ultrasound guidance multiple core samples were obtained with a 14-gauge spring-loaded biopsy device.  Bleeding was controlled with direct pressure and the puncture site at the 3 o'clock position closed with a staple at the end of the procedure.  The left breast was outlined in a V shaped flap at the 6 o'clock position to encompass all of the involved skin which showed marked thickening and erythema from the nipple to just above the inframammary fold.  The skin was incised  sharply and remaining dissection completed with the photon blade.  The flaps were eventually extended to the clavicle superiorly, to the sternum medially, 6 cm down along the rectus fascia inferiorly and to the latissimus muscle at the posterior axillary line laterally.  This was necessary to provide adequate skin for closure.  The breast was elevated off the underlying muscle taking the fascia with the specimen and in the area behind the tumor mass at 6:00 some of the muscle fibers as well.  No gross evidence of tumor at the resection margin.  3 hot, blue nodes were identified making use of the node seeker device and these were separated and sent for routine histology.  The wound was irrigated with sterile water.  A 15 [place] Blake drain was brought out through the inferior medial flap and anchored into place with a 3-0 nylon.  Initial attempts to bring the flaps together were unsuccessful and the eventual mobilization was as noted above.  The deep dermal layer was approximated with interrupted 2-0 Vicryl figure-of-eight sutures.  The skin was then closed with staples.  The drain was placed to self suction.  Telfa, fluff gauze and 2 XL binder was placed.  The patient tolerated procedure well and was taken to the PACU in stable condition.
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
## 2022-03-20 NOTE — Discharge Instructions (Addendum)
AMBULATORY SURGERY  DISCHARGE INSTRUCTIONS   The drugs that you were given will stay in your system until tomorrow so for the next 24 hours you should not:  Drive an automobile Make any legal decisions Drink any alcoholic beverage   You may resume regular meals tomorrow.  Today it is better to start with liquids and gradually work up to solid foods.  You may eat anything you prefer, but it is better to start with liquids, then soup and crackers, and gradually work up to solid foods.   Please notify your doctor immediately if you have any unusual bleeding, trouble breathing, redness and pain at the surgery site, drainage, fever, or pain not relieved by medication.       Please contact your physician with any problems or Same Day Surgery at [phone], Monday through Friday 6 am to 4 pm, or [facility] Main number at [phone].  

## 2022-03-21 ENCOUNTER — Encounter: Payer: Self-pay | Admitting: General Surgery

## 2022-03-22 NOTE — Anesthesia Postprocedure Evaluation (Addendum)
Anesthesia Post Note  Patient: Benjamin Armstrong  Procedure(s) Performed: SIMPLE MASTECTOMY WITH AXILLARY SENTINEL NODE BIOPSY (Left) BREAST BIOPSY (Right)  Patient location during evaluation: PACU Anesthesia Type: General Level of consciousness: awake and alert Pain management: pain level controlled Vital Signs Assessment: post-procedure vital signs reviewed and stable Respiratory status: spontaneous breathing, nonlabored ventilation, respiratory function stable and patient connected to nasal cannula oxygen Cardiovascular status: blood pressure returned to baseline and stable Postop Assessment: no apparent nausea or vomiting Anesthetic complications: yes   Encounter Notable Events  Notable Event Outcome Phase Comment  Difficult to intubate - expected  Intraprocedure Filed from anesthesia note documentation.     Last Vitals:  Vitals:   03/20/22 1730 03/20/22 1742  BP: (!) 149/64 (!) 150/65  Pulse: 70 68  Resp: 19 17  Temp: (!) 36.3 C (!) 36.2 C  SpO2: 92% 93%    Last Pain:  Vitals:   03/21/22 0908  TempSrc:   PainSc: 4                  Martha Clan

## 2022-03-23 ENCOUNTER — Other Ambulatory Visit: Payer: Self-pay | Admitting: Anatomic Pathology & Clinical Pathology

## 2022-03-23 LAB — SURGICAL PATHOLOGY

## 2022-03-27 ENCOUNTER — Encounter: Payer: Self-pay | Admitting: *Deleted

## 2022-03-28 ENCOUNTER — Encounter: Payer: Self-pay | Admitting: Licensed Clinical Social Worker

## 2022-03-28 ENCOUNTER — Ambulatory Visit (INDEPENDENT_AMBULATORY_CARE_PROVIDER_SITE_OTHER): Payer: Medicare Other | Admitting: Family Medicine

## 2022-03-28 ENCOUNTER — Encounter: Payer: Self-pay | Admitting: Family Medicine

## 2022-03-28 ENCOUNTER — Ambulatory Visit: Payer: Self-pay | Admitting: Licensed Clinical Social Worker

## 2022-03-28 ENCOUNTER — Telehealth: Payer: Self-pay | Admitting: Licensed Clinical Social Worker

## 2022-03-28 DIAGNOSIS — Z1379 Encounter for other screening for genetic and chromosomal anomalies: Secondary | ICD-10-CM | POA: Insufficient documentation

## 2022-03-28 DIAGNOSIS — E119 Type 2 diabetes mellitus without complications: Secondary | ICD-10-CM

## 2022-03-28 DIAGNOSIS — Z170 Estrogen receptor positive status [ER+]: Secondary | ICD-10-CM

## 2022-03-28 DIAGNOSIS — I10 Essential (primary) hypertension: Secondary | ICD-10-CM

## 2022-03-28 DIAGNOSIS — Z720 Tobacco use: Secondary | ICD-10-CM

## 2022-03-28 DIAGNOSIS — C50822 Malignant neoplasm of overlapping sites of left male breast: Secondary | ICD-10-CM

## 2022-03-28 DIAGNOSIS — E785 Hyperlipidemia, unspecified: Secondary | ICD-10-CM | POA: Diagnosis not present

## 2022-03-28 DIAGNOSIS — R7309 Other abnormal glucose: Secondary | ICD-10-CM

## 2022-03-28 DIAGNOSIS — R0609 Other forms of dyspnea: Secondary | ICD-10-CM | POA: Diagnosis not present

## 2022-03-28 DIAGNOSIS — Z7689 Persons encountering health services in other specified circumstances: Secondary | ICD-10-CM | POA: Insufficient documentation

## 2022-03-28 DIAGNOSIS — R21 Rash and other nonspecific skin eruption: Secondary | ICD-10-CM

## 2022-03-28 LAB — CBC
HCT: 38.7 % — ABNORMAL LOW (ref 39.0–52.0)
Hemoglobin: 12.8 g/dL — ABNORMAL LOW (ref 13.0–17.0)
MCHC: 33.2 g/dL (ref 30.0–36.0)
MCV: 91 fl (ref 78.0–100.0)
Platelets: 213 10*3/uL (ref 150.0–400.0)
RBC: 4.25 Mil/uL (ref 4.22–5.81)
RDW: 14.4 % (ref 11.5–15.5)
WBC: 10.5 10*3/uL (ref 4.0–10.5)

## 2022-03-28 LAB — HEPATIC FUNCTION PANEL
ALT: 13 U/L (ref 0–53)
AST: 12 U/L (ref 0–37)
Albumin: 3.7 g/dL (ref 3.5–5.2)
Alkaline Phosphatase: 59 U/L (ref 39–117)
Bilirubin, Direct: 0.1 mg/dL (ref 0.0–0.3)
Total Bilirubin: 0.4 mg/dL (ref 0.2–1.2)
Total Protein: 6.1 g/dL (ref 6.0–8.3)

## 2022-03-28 LAB — LDL CHOLESTEROL, DIRECT: Direct LDL: 116 mg/dL

## 2022-03-28 MED ORDER — SPIRIVA HANDIHALER 18 MCG IN CAPS: 18.0000 ug | ORAL_CAPSULE | Freq: Every day | RESPIRATORY_TRACT | 12 refills | Status: DC

## 2022-03-28 MED ORDER — BLOOD GLUCOSE MONITOR KIT: PACK | 0 refills | Status: DC

## 2022-03-28 NOTE — Progress Notes (Signed)
Benjamin Rumps, MD Phone: [phone]  Benjamin Armstrong is a 66 y.o. male who presents today for f/u.  DIABETES Disease Monitoring: Blood Sugar ranges-not checking Polyuria/phagia/dipsia- no    Medications: Compliance- not taking any medications  HYPERTENSION Disease Monitoring Home BP Monitoring not checking, though was 124/60 at the surgeons office today Chest pain- no    Dyspnea- stable Medications Compliance-  taking losartan.   BMET    Component Value Date/Time   NA 137 02/08/2022 1111   K 4.4 03/17/2022 0957   CL 100 02/08/2022 1111   CO2 29 02/08/2022 1111   GLUCOSE 91 02/08/2022 1111   BUN 14 02/08/2022 1111   CREATININE 0.92 02/08/2022 1111   CALCIUM 9.4 02/08/2022 1111   HYPERLIPIDEMIA Symptoms Chest pain on exertion:  no   Medications: Compliance- taking crestor Lipid Panel     Component Value Date/Time   CHOL 180 02/08/2022 1111   TRIG 139.0 02/08/2022 1111   HDL 41.10 02/08/2022 1111   CHOLHDL 4 02/08/2022 1111   VLDL 27.8 02/08/2022 1111   LDLCALC 111 (H) 02/08/2022 1111   LDLDIRECT 96.0 11/19/2020 1145   Breast cancer: Patient is status post mastectomy.  He saw his surgeon today and the patient notes they advised him he was doing well.  He has some pain into his armpit and there is a tight sensation.  Some swelling in his left arm though it is better than it was.  He is no longer taking narcotics for his pain.  He is now taking Advil.  He notes Aleve was helpful though he was taking 2 tablets twice daily.  He has developed a rash on his upper chest on the right side and left upper quadrant abdominally.  There is no itching with this.  He has had quite a bit of ecchymosis over his lower abdomen.  His surgeon is requested a CBC has a baseline.  Dyspnea on exertion: Previously had cardiac evaluation.  He notes the albuterol inhaler helps quite a bit with this.  Patient wonders about starting a new inhaler for this.  Social History   Tobacco Use  Smoking  Status Every Day   Packs/day: 1.00   Years: 53.00   Total pack years: 53.00   Types: Cigarettes  Smokeless Tobacco Never    Current Outpatient Medications on File Prior to Visit  Medication Sig Dispense Refill   albuterol (VENTOLIN HFA) 108 (90 Base) MCG/ACT inhaler Inhale 2 puffs into the lungs every 6 (six) hours as needed for wheezing or shortness of breath. (Patient taking differently: Inhale 2 puffs into the lungs daily.) 8 g 0   EPINEPHrine 0.3 mg/0.3 mL IJ SOAJ injection Inject 0.3 mg into the muscle as needed for anaphylaxis. 2 each 1   Famotidine (PEPCID AC PO) Take 1 tablet by mouth as needed.     HYDROcodone-acetaminophen (NORCO/VICODIN) 5-325 MG tablet Take 1 tablet by mouth every 4 (four) hours as needed for moderate pain. 20 tablet 0   losartan (COZAAR) 100 MG tablet Take 1 tablet (100 mg total) by mouth daily. 90 tablet 0   metFORMIN (GLUCOPHAGE) 500 MG tablet Take 1 tablet (500 mg total) by mouth 2 (two) times daily with a meal. 180 tablet 3   rosuvastatin (CRESTOR) 5 MG tablet TAKE 1 TABLET BY MOUTH 3 TIMES PER WEEK ON TUESDAY, THURSDAY, AND SATURDAY 36 tablet 9   sildenafil (REVATIO) 20 MG tablet 1-5 tablets as needed one hour prior to intercourse 30 tablet 2   tamsulosin (FLOMAX) 0.4  MG CAPS capsule TAKE 1 CAPSULE BY MOUTH EVERY DAY (Patient taking differently: 0.4 mg daily after supper.) 90 capsule 3   furosemide (LASIX) 20 MG tablet Take 1 tablet (20 mg total) by mouth daily as needed. For swelling 30 tablet 4   No current facility-administered medications on file prior to visit.     ROS see history of present illness  Objective  Physical Exam Vitals:   03/28/22 1206 03/28/22 1218  BP: 140/78 130/70  Pulse: 74   Temp: 98.5 F (36.9 C)   SpO2: 92%     BP Readings from Last 3 Encounters:  03/28/22 130/70  03/20/22 (!) 150/65  03/06/22 116/67   Wt Readings from Last 3 Encounters:  03/28/22 243 lb 9.6 oz (110.5 kg)  03/20/22 248 lb (112.5 kg)  03/06/22  244 lb 12.8 oz (111 kg)    Physical Exam Constitutional:      General: He is not in acute distress.    Appearance: He is not diaphoretic.  Cardiovascular:     Rate and Rhythm: Normal rate and regular rhythm.     Heart sounds: Normal heart sounds.  Pulmonary:     Effort: Pulmonary effort is normal.     Breath sounds: Normal breath sounds.  Abdominal:     Comments: Ecchymosis over lower abdomen up to his umbilicus, nontender, no warmth or erythema  Skin:    General: Skin is warm and dry.  Neurological:     Mental Status: He is alert.     On right upper chest, similar rash in left upper quadrant on abdomen  Assessment/Plan: Please see individual problem list.  Problem List Items Addressed This Visit     DOE (dyspnea on exertion) (Chronic)    Likely related to COPD.  He can continue as needed albuterol.  We will trial Spiriva.  I am going to defer PFTs until he has gotten through his breast cancer treatment.      Relevant Medications   tiotropium (SPIRIVA HANDIHALER) 18 MCG inhalation capsule   Essential hypertension (Chronic)    Adequately controlled.  He will continue losartan 100 mg daily.      Hyperlipidemia (Chronic)    Check LDL and hepatic function panel.  Continue Crestor 5 mg 3 times weekly.      Relevant Orders   Hepatic function panel   Direct LDL   Tobacco abuse (Chronic)    I encourage smoking cessation given risk of stroke, heart attack, cancer, and pulmonary issues.  Patient is not ready to quit smoking.      Type 2 diabetes mellitus (HCC) (Chronic)    Patient has type 2 diabetes.  He has not started medication as he wants to see what his sugars are running at home first.  We will resend the glucometer to the pharmacy.  Discussed checking his blood sugar fasting and 2 hours after 1 meal each day.      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  We will check a CBC given his ecchymosis.      Relevant Orders   CBC   Rash    Possibly related to a dermatologic reaction to something used on his skin during surgery.  They will trial over-the-counter hydrocortisone and see if that is beneficial.      Other Visit Diagnoses  Elevated hemoglobin A1c       Relevant Medications   blood glucose meter kit and supplies KIT        Return in about 6 weeks (around 05/09/2022) for Diabetes.   Benjamin Rumps, MD [place]

## 2022-03-28 NOTE — Assessment & Plan Note (Signed)
Patient has type 2 diabetes.  He has not started medication as he wants to see what his sugars are running at home first.  We will resend the glucometer to the pharmacy.  Discussed checking his blood sugar fasting and 2 hours after 1 meal each day.

## 2022-03-28 NOTE — Assessment & Plan Note (Signed)
I encourage smoking cessation given risk of stroke, heart attack, cancer, and pulmonary issues.  Patient is not ready to quit smoking.

## 2022-03-28 NOTE — Progress Notes (Signed)
HPI:  Mr. Holliman was previously seen in the [facility] clinic due to a personal and family history of cancer and concerns regarding a hereditary predisposition to cancer. Please refer to our prior cancer genetics clinic note for more information regarding our discussion, assessment and recommendations, at the time. Mr. Cassatt recent genetic test results were disclosed to him, as were recommendations warranted by these results. These results and recommendations are discussed in more detail below.  CANCER HISTORY:  Oncology History Overview Note  IMPRESSION: 1. Highly suspicious mass at the palpable site of concern in the retroareolar left breast measuring 3.0 cm.   2.  No left axillary adenopathy.   3.  No mammographic evidence of malignancy in the left breast.A. BREAST, LEFT; ULTRASOUND-GUIDED CORE BIOPSY:  - INVASIVE MAMMARY CARCINOMA, NO SPECIAL TYPE.   Size of invasive carcinoma: 17 mm in this sample  Histologic grade of invasive carcinoma: Grade 3                       Glandular/tubular differentiation score: 3                       Nuclear pleomorphism score: 3                       Mitotic rate score: 2                       Total score: 3  Ductal carcinoma in situ: Not identified  Lymphovascular invasion: Not identified CASE SUMMARY: BREAST BIOMARKER TESTS  Estrogen Receptor (ER) Status: POSITIVE          Percentage of cells with nuclear positivity: Greater than 90%          Average intensity of staining: Strong   Progesterone Receptor (PgR) Status: POSITIVE          Percentage of cells with nuclear positivity: 11-50%          Average intensity of staining: Strong   HER2 (by immunohistochemistry): NEGATIVE (Score 0)  Ki-67: Not performed   #Left breast cancer -T2 N0- ER/PR- POSITIVE; Her 2 NEG [0]; Dr.Byrnett   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 No pathogenic variants identified on the [facility] Common Hereditary Cancers+RNA panel. The report date is 03/28/2022.  The Common Hereditary Cancers Panel + RNA offered by [facility] includes sequencing and/or deletion duplication testing of the following 47 genes: APC, ATM, AXIN2, BARD1, BMPR1A, BRCA1, BRCA2, BRIP1, CDH1, CDKN2A (p14ARF), CDKN2A (p16INK4a), CKD4, CHEK2, CTNNA1, DICER1, EPCAM (Deletion/duplication testing only), GREM1 (promoter region deletion/duplication testing only), KIT, MEN1, MLH1, MSH2, MSH3, MSH6, MUTYH, NBN, NF1, NHTL1, PALB2, PDGFRA, PMS2, POLD1, POLE, PTEN, RAD50, RAD51C, RAD51D, SDHB, SDHC, SDHD, SMAD4, SMARCA4. STK11, TP53, TSC1, TSC2, and VHL.  The following genes were evaluated for sequence changes only: SDHA and HOXB13 c.251G>A variant only.     FAMILY HISTORY:  We obtained a detailed, 4-generation family history.  Significant diagnoses are listed below: Family History  Problem Relation Age of Onset   Uterine cancer Mother 14   Breast cancer Mother 42   Lung disease Father    Prostate cancer Father    Asthma Sister    Autoimmune disease Brother  Kidney failure Brother        on HD   Brain cancer Maternal Uncle    Lung cancer Paternal Aunt    Leukemia Paternal Aunt    Cancer Paternal Aunt        metastatic   Lung cancer Paternal Uncle    Lymphoma Maternal Grandmother    Other Maternal Grandfather        abdominal carcinomatosis   Bladder Cancer Paternal Grandmother    Prostate cancer Paternal Grandfather    Breast cancer Cousin        dx 27s   Breast cancer Cousin        dx 70s   Breast cancer Cousin        dx 52s     Mr. Bourcier has 1 sister, June, 65 with no cancer history. He has 1 brother, 27, skin cancer. He has 1 nephew.   Mr. Class mother had uterine cancer at 87, breast cancer at 9, and passed of dementia at 25. Patient had 2  maternal aunts, 3 maternal uncles. One uncle had brain cancer. Two first cousins had breast cancer and a second cousin had breast cancer in her 27s. Maternal grandmother had lymphoma and passed at 37. Grandfather had abdominal carcinomatosis and passed at 8.   Mr. Arora father passed at 55 and had history of prostate cancer. Patient had 5 paternal aunts, 3 paternal uncles. One uncle and one aunt had lung cancer. Another aunt had leukemia. Another aunt had unknown type of cancer that was metastatic. Paternal grandmother had bladder tumor and grandfather had prostate cancer.   Mr. Kienast is unaware of previous family history of genetic testing for hereditary cancer risks. There is no reported Ashkenazi Jewish ancestry. There is no known consanguinity.      GENETIC TEST RESULTS: Genetic testing reported out on 03/28/2022 through the [facility] Common Hereditary Cancers+RNA cancer panel found no pathogenic mutations.   The Common Hereditary Cancers Panel + RNA offered by [facility] includes sequencing and/or deletion duplication testing of the following 47 genes: APC, ATM, AXIN2, BARD1, BMPR1A, BRCA1, BRCA2, BRIP1, CDH1, CDKN2A (p14ARF), CDKN2A (p16INK4a), CKD4, CHEK2, CTNNA1, DICER1, EPCAM (Deletion/duplication testing only), GREM1 (promoter region deletion/duplication testing only), KIT, MEN1, MLH1, MSH2, MSH3, MSH6, MUTYH, NBN, NF1, NHTL1, PALB2, PDGFRA, PMS2, POLD1, POLE, PTEN, RAD50, RAD51C, RAD51D, SDHB, SDHC, SDHD, SMAD4, SMARCA4. STK11, TP53, TSC1, TSC2, and VHL.  The following genes were evaluated for sequence changes only: SDHA and HOXB13 c.251G>A variant only.   The test report has been scanned into EPIC and is located under the Molecular Pathology section of the Results Review tab.  A portion of the result report is included below for reference.      We discussed that because current genetic testing is not perfect, it is possible there may be a gene mutation in one of these genes that current  testing cannot detect, but that chance is small.  There could be another gene that has not yet been discovered, or that we have not yet tested, that is responsible for the cancer diagnoses in the family. It is also possible there is a hereditary cause for the cancer in the family that Mr. Tally did not inherit and therefore was not identified in his testing.  Therefore, it is important to remain in touch with cancer genetics in the future so that we can continue to offer Mr. Pulis the most up to date genetic testing.   ADDITIONAL GENETIC TESTING: We discussed with Mr. Oliff that  his genetic testing was fairly extensive.  If there are genes identified to increase cancer risk that can be analyzed in the future, we would be happy to discuss and coordinate this testing at that time.    CANCER SCREENING RECOMMENDATIONS: Mr. Mesa test result is considered negative (normal).  This means that we have not identified a hereditary cause for his  personal and family history of cancer at this time. Most cancers happen by chance and this negative test suggests that his cancer may fall into this category.    While reassuring, this does not definitively rule out a hereditary predisposition to cancer. It is still possible that there could be genetic mutations that are undetectable by current technology. There could be genetic mutations in genes that have not been tested or identified to increase cancer risk.  Therefore, it is recommended he continue to follow the cancer management and screening guidelines provided by his oncology and primary healthcare provider.   An individual's cancer risk and medical management are not determined by genetic test results alone. Overall cancer risk assessment incorporates additional factors, including personal medical history, family history, and any available genetic information that may result in a personalized plan for cancer prevention and surveillance.  RECOMMENDATIONS FOR  FAMILY MEMBERS:  Relatives in this family might be at some increased risk of developing cancer, over the general population risk, simply due to the family history of cancer.  We recommended male relatives in this family have a yearly mammogram beginning at age 38, or 40 years younger than the earliest onset of cancer, an annual clinical breast exam, and perform monthly breast self-exams. Male relatives in this family should also have a gynecological exam as recommended by their primary provider.  All family members should be referred for colonoscopy starting at age 57.    It is also possible there is a hereditary cause for the cancer in Mr. Serfass's family that he did not inherit and therefore was not identified in him.  Based on Mr. Yaeger's family history, we recommended maternal relatives, especially his cousin who had breast cancer in her 27s,  have genetic counseling and testing. Mr. Westrup will let us know if we can be of any assistance in coordinating genetic counseling and/or testing for these family members.  FOLLOW-UP: Lastly, we discussed with Mr. Godbolt that cancer genetics is a rapidly advancing field and it is possible that new genetic tests will be appropriate for him and/or his family members in the future. We encouraged him to remain in contact with cancer genetics on an annual basis so we can update his personal and family histories and let him know of advances in cancer genetics that may benefit this family.   Our contact number was provided. Mr. Bushey questions were answered to his satisfaction, and he knows he is welcome to call us at anytime with additional questions or concerns.   Faith Rogue, MS, [facility] Genetic Counselor [email or web address] Phone: [phone]

## 2022-03-28 NOTE — Assessment & Plan Note (Signed)
Likely related to COPD.  He can continue as needed albuterol.  We will trial Spiriva.  I am going to defer PFTs until he has gotten through his breast cancer treatment.

## 2022-03-28 NOTE — Assessment & Plan Note (Signed)
Check LDL and hepatic function panel.  Continue Crestor 5 mg 3 times weekly.

## 2022-03-28 NOTE — Assessment & Plan Note (Signed)
Adequately controlled.  He will continue losartan 100 mg daily.

## 2022-03-28 NOTE — Patient Instructions (Signed)
Nice to see you. We will contact you with your lab results. I sent in Spiriva.  If this is excessively expensive please let me know. Please try over-the-counter hydrocortisone on the rash on your right upper chest and left upper quadrant of your abdomen.  Do not get this on your surgical scar.

## 2022-03-28 NOTE — Assessment & Plan Note (Signed)
Possibly related to a dermatologic reaction to something used on his skin during surgery.  They will trial over-the-counter hydrocortisone and see if that is beneficial.

## 2022-03-28 NOTE — Assessment & Plan Note (Signed)
He will continue to see general surgery and oncology.  We will check a CBC given his ecchymosis.

## 2022-03-28 NOTE — Telephone Encounter (Signed)
Revealed negative genetic testing.  We discussed that we do not know why he has breast cancer or why there is cancer in the family. It could be due to a different gene that we are not testing, or something our current technology cannot pick up.  It will be important for him to keep in contact with genetics to learn if additional testing may be needed in the future.

## 2022-03-29 ENCOUNTER — Telehealth: Payer: Self-pay | Admitting: Family Medicine

## 2022-03-29 DIAGNOSIS — E119 Type 2 diabetes mellitus without complications: Secondary | ICD-10-CM

## 2022-03-29 DIAGNOSIS — E1159 Type 2 diabetes mellitus with other circulatory complications: Secondary | ICD-10-CM

## 2022-03-29 DIAGNOSIS — R7309 Other abnormal glucose: Secondary | ICD-10-CM

## 2022-03-29 DIAGNOSIS — E785 Hyperlipidemia, unspecified: Secondary | ICD-10-CM

## 2022-03-29 NOTE — Telephone Encounter (Signed)
Pt sister called in stating the cma called in a meter, lancet and test strips for pt but the pharmacy stated it has to be three separate scripts and not one. Also pt sister stated that pt cannot afford the Spiriva inhaler, after insurance it is still 450 dollars

## 2022-03-30 MED ORDER — ROSUVASTATIN CALCIUM 5 MG PO TABS: ORAL_TABLET | ORAL | 1 refills | Status: DC

## 2022-03-30 MED ORDER — BLOOD GLUCOSE MONITOR KIT: PACK | 0 refills | Status: DC

## 2022-03-31 MED ORDER — UMECLIDINIUM BROMIDE 62.5 MCG/ACT IN AEPB
1.0000 | INHALATION_SPRAY | Freq: Every day | RESPIRATORY_TRACT | 3 refills | Status: DC
Start: 2022-03-31 — End: 2022-05-26

## 2022-03-31 MED ORDER — ACCU-CHEK SOFTCLIX LANCETS MISC: 3 refills | Status: DC

## 2022-03-31 MED ORDER — BLOOD GLUCOSE MONITOR KIT: PACK | 0 refills | Status: DC

## 2022-03-31 MED ORDER — GLUCOSE BLOOD VI STRP: ORAL_STRIP | 12 refills | Status: DC

## 2022-03-31 NOTE — Telephone Encounter (Addendum)
Noted.  I sent another inhaler in called incruse and we will see if this is affordable for him.  Correct prescription sent in for glucometer, strips, and lancets.

## 2022-03-31 NOTE — Addendum Note (Signed)
Addended by: Leone Haven on: 03/31/2022 04:44 PM   Modules accepted: Orders

## 2022-04-03 NOTE — Telephone Encounter (Signed)
What questions do they have about the other medications?

## 2022-04-03 NOTE — Telephone Encounter (Signed)
Patient Sister call about: (1) Incruse inhaler is not affordable at 318.01 (2) Has questions about all other medications because they may be looking to move to another pharmacy.  She can be reached at [phone]

## 2022-04-04 ENCOUNTER — Other Ambulatory Visit: Payer: Self-pay

## 2022-04-04 DIAGNOSIS — E785 Hyperlipidemia, unspecified: Secondary | ICD-10-CM

## 2022-04-04 DIAGNOSIS — R0609 Other forms of dyspnea: Secondary | ICD-10-CM

## 2022-04-04 DIAGNOSIS — R7309 Other abnormal glucose: Secondary | ICD-10-CM

## 2022-04-04 DIAGNOSIS — I152 Hypertension secondary to endocrine disorders: Secondary | ICD-10-CM

## 2022-04-04 DIAGNOSIS — N401 Enlarged prostate with lower urinary tract symptoms: Secondary | ICD-10-CM

## 2022-04-04 DIAGNOSIS — E119 Type 2 diabetes mellitus without complications: Secondary | ICD-10-CM

## 2022-04-04 MED ORDER — LOSARTAN POTASSIUM 100 MG PO TABS: 100.0000 mg | ORAL_TABLET | Freq: Every day | ORAL | 0 refills | Status: DC

## 2022-04-04 MED ORDER — METFORMIN HCL 500 MG PO TABS: 500.0000 mg | ORAL_TABLET | Freq: Two times a day (BID) | ORAL | 3 refills | Status: DC

## 2022-04-04 MED ORDER — BLOOD GLUCOSE MONITOR KIT: PACK | 0 refills | Status: DC

## 2022-04-04 MED ORDER — ACCU-CHEK SOFTCLIX LANCETS MISC: 3 refills | Status: DC

## 2022-04-04 MED ORDER — FUROSEMIDE 20 MG PO TABS: 20.0000 mg | ORAL_TABLET | Freq: Every day | ORAL | 0 refills | Status: DC | PRN

## 2022-04-04 MED ORDER — ROSUVASTATIN CALCIUM 5 MG PO TABS: ORAL_TABLET | ORAL | 1 refills | Status: DC

## 2022-04-04 MED ORDER — METFORMIN HCL 500 MG PO TABS
500.0000 mg | ORAL_TABLET | Freq: Two times a day (BID) | ORAL | 3 refills | Status: DC
Start: 2022-04-04 — End: 2022-09-20

## 2022-04-04 MED ORDER — SILDENAFIL CITRATE 20 MG PO TABS: ORAL_TABLET | ORAL | 2 refills | Status: DC

## 2022-04-04 MED ORDER — ALBUTEROL SULFATE HFA 108 (90 BASE) MCG/ACT IN AERS: 2.0000 | INHALATION_SPRAY | Freq: Four times a day (QID) | RESPIRATORY_TRACT | 0 refills | Status: DC | PRN

## 2022-04-04 MED ORDER — GLUCOSE BLOOD VI STRP: ORAL_STRIP | 12 refills | Status: DC

## 2022-04-04 MED ORDER — TAMSULOSIN HCL 0.4 MG PO CAPS: 0.4000 mg | ORAL_CAPSULE | Freq: Every day | ORAL | 3 refills | Status: DC

## 2022-04-04 MED ORDER — ROSUVASTATIN CALCIUM 5 MG PO TABS: 5.0000 mg | ORAL_TABLET | Freq: Every day | ORAL | 1 refills | Status: DC

## 2022-04-04 NOTE — Telephone Encounter (Signed)
LVM for patient to call back.   Benjamin Armstrong,cma  

## 2022-04-04 NOTE — Addendum Note (Signed)
Addended by: Caryl Bis, [place] Cibrian G on: 04/04/2022 11:12 AM   Modules accepted: Orders

## 2022-04-04 NOTE — Telephone Encounter (Signed)
Patients sister came in today with her husband and I spoke with her about the patient (her Brother) she needed all his medications printed and she would take them to her new pharmacy, these were all printed today and given to her to take to the pharmacy.  Nia,cma

## 2022-04-05 ENCOUNTER — Telehealth: Payer: Self-pay | Admitting: Family Medicine

## 2022-04-05 NOTE — Telephone Encounter (Signed)
I corrected this on the printed prescription that we gave to his sister. He should take it daily and should take the printed prescription in so that they can have the updated prescription on file.

## 2022-04-05 NOTE — Telephone Encounter (Signed)
Called patient to schedule his AWV he stated that he was taking Rpivastatom 3x a week, but Dr Caryl Bis wanted it take daily.  The [facility] pharmacy filled the RX with only 39 pills for 90 days.  Patient: Benjamin Armstrong Phone: [phone] Provider Dr Caryl Bis Rx: Voncille Lo '5mg'$   Pharmacy: [facility]

## 2022-04-06 ENCOUNTER — Other Ambulatory Visit: Payer: Self-pay | Admitting: *Deleted

## 2022-04-06 ENCOUNTER — Inpatient Hospital Stay: Payer: Medicare Other | Attending: Oncology | Admitting: Internal Medicine

## 2022-04-06 ENCOUNTER — Telehealth: Payer: Self-pay

## 2022-04-06 ENCOUNTER — Encounter: Payer: Self-pay | Admitting: Internal Medicine

## 2022-04-06 DIAGNOSIS — Z170 Estrogen receptor positive status [ER+]: Secondary | ICD-10-CM | POA: Diagnosis not present

## 2022-04-06 DIAGNOSIS — F17210 Nicotine dependence, cigarettes, uncomplicated: Secondary | ICD-10-CM | POA: Diagnosis not present

## 2022-04-06 DIAGNOSIS — Z79899 Other long term (current) drug therapy: Secondary | ICD-10-CM | POA: Insufficient documentation

## 2022-04-06 DIAGNOSIS — C50822 Malignant neoplasm of overlapping sites of left male breast: Secondary | ICD-10-CM | POA: Insufficient documentation

## 2022-04-06 DIAGNOSIS — I152 Hypertension secondary to endocrine disorders: Secondary | ICD-10-CM

## 2022-04-06 MED ORDER — TAMOXIFEN CITRATE 20 MG PO TABS: 20.0000 mg | ORAL_TABLET | Freq: Every day | ORAL | 1 refills | Status: DC
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
## 2022-04-06 NOTE — Telephone Encounter (Signed)
I called and spoke with the patient and his sister and informed them both to take the printed prescription in so that the medication schedule can be updated and they understood.   Jevan Gaunt,cma

## 2022-04-06 NOTE — Telephone Encounter (Signed)
Not ready to schedule at this time

## 2022-04-06 NOTE — Telephone Encounter (Signed)
Oncotype order submitted online (order # [id number])

## 2022-04-06 NOTE — Assessment & Plan Note (Addendum)
#  JULY 2023- male breast cancer T2 [4.8cm] N1- micro met; ER strongly positive; PR positive; lymphovascular invasion grade 3. I reviewed the pathology/staging in detail.  Margins are negative.  #I did review with the patient/sister concerning findings on pathology [size; micro met; LVI grade 3]; and the risk of recurrence.  Recommend Oncotype of further risk assessment; also the benefit from chemotherapy.  #Also discussed regarding postmastectomy radiation given the size; grade of tumor; LVI-patient is again very reluctant at this time.  Discussed with Dr. Donella Stade; next evaluation.  Will discuss with patient next visit/after Oncotype available.  Also messaged Dr. Bary Castilla.  # Discussed the role of anti-hormonal therapy mechanism of action- recommend tamoxifen 5 to 10 years.  I would recommend tamoxifen for 5 years.  Long discussion regarding the potential adverse events on tamoxifen including but not limited to hot flashes, joint aches etc. prescription given for insurance reasons.  Patient not to start until next visit.  # Genetics s/p July 2023 -evaluation negative for any obvious deleterious mutation.   # DISPOSITION: # follow up in 3 weeks/Friday- MD; no labs- Dr.B

## 2022-04-06 NOTE — Progress Notes (Signed)
New constipation with last bowel movement this morning and is taking daily stool softeners.

## 2022-04-07 ENCOUNTER — Ambulatory Visit (INDEPENDENT_AMBULATORY_CARE_PROVIDER_SITE_OTHER): Payer: Medicare Other

## 2022-04-07 VITALS — Ht 74.0 in | Wt 245.0 lb

## 2022-04-07 DIAGNOSIS — Z0000 Encounter for general adult medical examination without abnormal findings: Secondary | ICD-10-CM

## 2022-04-07 NOTE — Patient Instructions (Addendum)
Mr. Benjamin Armstrong , Thank you for taking time to come for your Medicare Wellness Visit. I appreciate your ongoing commitment to your health goals. Please review the following plan we discussed and let me know if I can assist you in the future.   These are the goals we discussed:  Goals       Patient Stated     Blood Pressure < 140/90 (pt-stated)      Spot check blood pressure.      Other     Maintain healthy lifestyle      Stay active Healthy diet Stay hydrated        This is a list of the screening recommended for you and due dates:  Health Maintenance  Topic Date Due   Flu Shot  04/04/2022   Pneumonia Vaccine (1 - PCV) 05/05/2022*   Complete foot exam   05/05/2022*   Hemoglobin A1C  08/10/2022   Eye exam for diabetics  03/29/2023   Cologuard (Stool DNA test)  05/26/2023   Hepatitis C Screening: USPSTF Recommendation to screen - Ages 31-79 yo.  Completed   HPV Vaccine  Aged Out   Tetanus Vaccine  Discontinued   COVID-19 Vaccine  Discontinued   Zoster (Shingles) Vaccine  Discontinued  *Topic was postponed. The date shown is not the original due date.    Falls screening: 0 falls in the last 12 months. No falls risk. Depression screening:PH-2 =0, PH-9=0 Cognitive screening: Alert and oriented x3.  No falls, depression, or cognitive follow up required.  Difficulty pulling falls/depression/cognitive screening into note, added to AVS.   Opioid Pain Medicine Management Opioids are powerful medicines that are used to treat moderate to severe pain. When used for short periods of time, they can help you to: Sleep better. Do better in physical or occupational therapy. Feel better in the first few days after an injury. Recover from surgery. Opioids should be taken with the supervision of a trained health care provider. They should be taken for the shortest period of time possible. This is because opioids can be addictive, and the longer you take opioids, the greater your risk of  addiction. This addiction can also be called opioid use disorder. What are the risks? Using opioid pain medicines for longer than 3 days increases your risk of side effects. Side effects include: Constipation. Nausea and vomiting. Breathing difficulties (respiratory depression). Drowsiness. Confusion. Opioid use disorder. Itching. Taking opioid pain medicine for a long period of time can affect your ability to do daily tasks. It also puts you at risk for: Motor vehicle crashes. Depression. Suicide. Heart attack. Overdose, which can be life-threatening. What is a pain treatment plan? A pain treatment plan is an agreement between you and your health care provider. Pain is unique to each person, and treatments vary depending on your condition. To manage your pain, you and your health care provider need to work together. To help you do this: Discuss the goals of your treatment, including how much pain you might expect to have and how you will manage the pain. Review the risks and benefits of taking opioid medicines. Remember that a good treatment plan uses more than one approach and minimizes the chance of side effects. Be honest about the amount of medicines you take and about any drug or alcohol use. Get pain medicine prescriptions from only one health care provider. Pain can be managed with many types of alternative treatments. Ask your health care provider to refer you to one or more  specialists who can help you manage pain through: Physical or occupational therapy. Counseling (cognitive behavioral therapy). Good nutrition. Biofeedback. Massage. Meditation. Non-opioid medicine. Following a gentle exercise program. How to use opioid pain medicine Taking medicine Take your pain medicine exactly as told by your health care provider. Take it only when you need it. If your pain gets less severe, you may take less than your prescribed dose if your health care provider approves. If you  are not having pain, do nottake pain medicine unless your health care provider tells you to take it. If your pain is severe, do nottry to treat it yourself by taking more pills than instructed on your prescription. Contact your health care provider for help. Write down the times when you take your pain medicine. It is easy to become confused while on pain medicine. Writing the time can help you avoid overdose. Take other over-the-counter or prescription medicines only as told by your health care provider. Keeping yourself and others safe  While you are taking opioid pain medicine: Do not drive, use machinery, or power tools. Do not sign legal documents. Do not drink alcohol. Do not take sleeping pills. Do not supervise children by yourself. Do not do activities that require climbing or being in high places. Do not go to a lake, river, ocean, spa, or swimming pool. Do not share your pain medicine with anyone. Keep pain medicine in a locked cabinet or in a secure area where pets and children cannot reach it. Stopping your use of opioids If you have been taking opioid medicine for more than a few weeks, you may need to slowly decrease (taper) how much you take until you stop completely. Tapering your use of opioids can decrease your risk of symptoms of withdrawal, such as: Pain and cramping in the abdomen. Nausea. Sweating. Sleepiness. Restlessness. Uncontrollable shaking (tremors). Cravings for the medicine. Do not attempt to taper your use of opioids on your own. Talk with your health care provider about how to do this. Your health care provider may prescribe a step-down schedule based on how much medicine you are taking and how long you have been taking it. Getting rid of leftover pills Do not save any leftover pills. Get rid of leftover pills safely by: Taking the medicine to a prescription take-back program. This is usually offered by the county or law enforcement. Bringing them to a  pharmacy that has a drug disposal container. Flushing them down the toilet. Check the label or package insert of your medicine to see whether this is safe to do. Throwing them out in the trash. Check the label or package insert of your medicine to see whether this is safe to do. If it is safe to throw it out, remove the medicine from the original container, put it into a sealable bag or container, and mix it with used coffee grounds, food scraps, dirt, or cat litter before putting it in the trash. Follow these instructions at home: Activity Do exercises as told by your health care provider. Avoid activities that make your pain worse. Return to your normal activities as told by your health care provider. Ask your health care provider what activities are safe for you. General instructions You may need to take these actions to prevent or treat constipation: Drink enough fluid to keep your urine pale yellow. Take over-the-counter or prescription medicines. Eat foods that are high in fiber, such as beans, whole grains, and fresh fruits and vegetables. Limit foods that are high  in fat and processed sugars, such as fried or sweet foods. Keep all follow-up visits. This is important. Where to find support If you have been taking opioids for a long time, you may benefit from receiving support for quitting from a local support group or counselor. Ask your health care provider for a referral to these resources in your area. Where to find more information Centers for Disease Control and Prevention (CDC): http://www.wolf.info/ U.S. Food and Drug Administration (FDA): [place] Get help right away if: You may have taken too much of an opioid (overdosed). Common symptoms of an overdose: Your breathing is slower or more shallow than normal. You have a very slow heartbeat (pulse). You have slurred speech. You have nausea and vomiting. Your pupils become very small. You have other potential symptoms: You are very  confused. You faint or feel like you will faint. You have cold, clammy skin. You have blue lips or fingernails. You have thoughts of harming yourself or harming others. These symptoms may represent a serious problem that is an emergency. Do not wait to see if the symptoms will go away. Get medical help right away. Call your local emergency services (911 in the [place] Do not drive yourself to the hospital.  If you ever feel like you may hurt yourself or others, or have thoughts about taking your own life, get help right away. Go to your nearest emergency department or: Call your local emergency services (911 in the [place] Call the [facility] 314-781-4745 in the [place].). Call a suicide crisis helpline, such as the [facility] at 8308553902 or 988 in the [place]. This is open 24 hours a day in the [place] Text the Crisis Text Line at (430)353-5053 (in the [place].). Summary Opioid medicines can help you manage moderate to severe pain for a short period of time. A pain treatment plan is an agreement between you and your health care provider. Discuss the goals of your treatment, including how much pain you might expect to have and how you will manage the pain. If you think that you or someone else may have taken too much of an opioid, get medical help right away. This information is not intended to replace advice given to you by your health care provider. Make sure you discuss any questions you have with your health care provider. Document Revised: 03/16/2021 Document Reviewed: 12/01/2020 Elsevier Patient Education  [place].
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
## 2022-04-11 ENCOUNTER — Encounter: Payer: Self-pay | Admitting: *Deleted

## 2022-04-18 ENCOUNTER — Other Ambulatory Visit: Payer: Self-pay

## 2022-04-20 ENCOUNTER — Other Ambulatory Visit: Payer: Self-pay | Admitting: General Surgery

## 2022-04-20 ENCOUNTER — Encounter: Payer: Self-pay | Admitting: Internal Medicine

## 2022-04-20 DIAGNOSIS — I9789 Other postprocedural complications and disorders of the circulatory system, not elsewhere classified: Secondary | ICD-10-CM
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
## 2022-04-23 MED ORDER — CEFAZOLIN SODIUM-DEXTROSE 2-4 GM/100ML-% IV SOLN
2.0000 g | INTRAVENOUS | Status: AC
  Administered 2022-04-24: 2 g via INTRAVENOUS

## 2022-04-23 MED ORDER — CHLORHEXIDINE GLUCONATE CLOTH 2 % EX PADS: 6.0000 | MEDICATED_PAD | Freq: Once | CUTANEOUS | Status: DC

## 2022-04-24 ENCOUNTER — Ambulatory Visit: Payer: Medicare Other | Admitting: Anesthesiology

## 2022-04-24 ENCOUNTER — Encounter: Payer: Self-pay | Admitting: General Surgery

## 2022-04-24 ENCOUNTER — Ambulatory Visit
Admission: RE | Admit: 2022-04-24 | Discharge: 2022-04-24 | Disposition: A | Discharge status: Home / Self Care | Payer: Medicare Other | Attending: General Surgery | Admitting: General Surgery

## 2022-04-24 ENCOUNTER — Other Ambulatory Visit: Payer: Self-pay

## 2022-04-24 ENCOUNTER — Encounter
Admission: RE | Disposition: A | Discharge status: Home / Self Care | Payer: Self-pay | Source: Home / Self Care | Attending: General Surgery

## 2022-04-24 DIAGNOSIS — F17210 Nicotine dependence, cigarettes, uncomplicated: Secondary | ICD-10-CM | POA: Insufficient documentation

## 2022-04-24 DIAGNOSIS — L7682 Other postprocedural complications of skin and subcutaneous tissue: Secondary | ICD-10-CM | POA: Diagnosis not present

## 2022-04-24 DIAGNOSIS — J449 Chronic obstructive pulmonary disease, unspecified: Secondary | ICD-10-CM | POA: Insufficient documentation

## 2022-04-24 DIAGNOSIS — Z9012 Acquired absence of left breast and nipple: Secondary | ICD-10-CM | POA: Diagnosis not present

## 2022-04-24 DIAGNOSIS — N400 Enlarged prostate without lower urinary tract symptoms: Secondary | ICD-10-CM | POA: Insufficient documentation

## 2022-04-24 DIAGNOSIS — Z7984 Long term (current) use of oral hypoglycemic drugs: Secondary | ICD-10-CM | POA: Insufficient documentation

## 2022-04-24 DIAGNOSIS — I10 Essential (primary) hypertension: Secondary | ICD-10-CM | POA: Insufficient documentation

## 2022-04-24 DIAGNOSIS — E119 Type 2 diabetes mellitus without complications: Secondary | ICD-10-CM | POA: Diagnosis not present

## 2022-04-24 HISTORY — PX: WOUND DEBRIDEMENT: SHX247

## 2022-04-24 HISTORY — PX: APPLICATION OF WOUND VAC: SHX5189

## 2022-04-24 LAB — GLUCOSE, CAPILLARY: Glucose-Capillary: 113 mg/dL — ABNORMAL HIGH (ref 70–99)

## 2022-04-24 SURGERY — DEBRIDEMENT, WOUND
Anesthesia: General | Site: Chest | Laterality: Left

## 2022-04-24 MED ORDER — BUPIVACAINE HCL (PF) 0.5 % IJ SOLN
INTRAMUSCULAR | Status: AC
  Filled 2022-04-24: qty 30

## 2022-04-24 MED ORDER — LIDOCAINE HCL (CARDIAC) PF 100 MG/5ML IV SOSY
PREFILLED_SYRINGE | INTRAVENOUS | Status: DC | PRN
  Administered 2022-04-24: 60 mg via INTRAVENOUS

## 2022-04-24 MED ORDER — FENTANYL CITRATE (PF) 100 MCG/2ML IJ SOLN: 25.0000 ug | INTRAMUSCULAR | Status: DC | PRN

## 2022-04-24 MED ORDER — MIDAZOLAM HCL 2 MG/2ML IJ SOLN
INTRAMUSCULAR | Status: DC | PRN
  Administered 2022-04-24: 2 mg via INTRAVENOUS

## 2022-04-24 MED ORDER — PROPOFOL 10 MG/ML IV BOLUS
INTRAVENOUS | Status: AC
  Filled 2022-04-24: qty 20

## 2022-04-24 MED ORDER — CEFAZOLIN SODIUM-DEXTROSE 2-4 GM/100ML-% IV SOLN
INTRAVENOUS | Status: AC
  Filled 2022-04-24: qty 100

## 2022-04-24 MED ORDER — ACETAMINOPHEN 10 MG/ML IV SOLN: 1000.0000 mg | Freq: Once | INTRAVENOUS | Status: DC | PRN

## 2022-04-24 MED ORDER — PROPOFOL 500 MG/50ML IV EMUL
INTRAVENOUS | Status: DC | PRN
  Administered 2022-04-24: 100 ug/kg/min via INTRAVENOUS
  Administered 2022-04-24: 150 ug/kg/min via INTRAVENOUS

## 2022-04-24 MED ORDER — FENTANYL CITRATE (PF) 100 MCG/2ML IJ SOLN
INTRAMUSCULAR | Status: DC | PRN
  Administered 2022-04-24 (×2): 25 ug via INTRAVENOUS

## 2022-04-24 MED ORDER — MIDAZOLAM HCL 2 MG/2ML IJ SOLN
INTRAMUSCULAR | Status: AC
  Filled 2022-04-24: qty 2

## 2022-04-24 MED ORDER — 0.9 % SODIUM CHLORIDE (POUR BTL) OPTIME
TOPICAL | Status: DC | PRN
  Administered 2022-04-24: 1000 mL

## 2022-04-24 MED ORDER — LACTATED RINGERS IV SOLN: INTRAVENOUS | Status: DC

## 2022-04-24 MED ORDER — PROPOFOL 10 MG/ML IV BOLUS
INTRAVENOUS | Status: DC | PRN
  Administered 2022-04-24: 50 mg via INTRAVENOUS

## 2022-04-24 MED ORDER — LIDOCAINE HCL (PF) 2 % IJ SOLN
INTRAMUSCULAR | Status: AC
  Filled 2022-04-24: qty 5

## 2022-04-24 MED ORDER — FENTANYL CITRATE (PF) 100 MCG/2ML IJ SOLN
INTRAMUSCULAR | Status: AC
  Filled 2022-04-24: qty 2

## 2022-04-24 MED ORDER — ONDANSETRON HCL 4 MG/2ML IJ SOLN: 4.0000 mg | Freq: Once | INTRAMUSCULAR | Status: DC | PRN

## 2022-04-24 MED ORDER — LACTATED RINGERS IV SOLN: INTRAVENOUS | Status: DC | PRN

## 2022-04-24 MED ORDER — OXYCODONE HCL 5 MG/5ML PO SOLN: 5.0000 mg | Freq: Once | ORAL | Status: DC | PRN

## 2022-04-24 MED ORDER — OXYCODONE HCL 5 MG PO TABS: 5.0000 mg | ORAL_TABLET | Freq: Once | ORAL | Status: DC | PRN

## 2022-04-24 MED ORDER — DEXMEDETOMIDINE (PRECEDEX) IN NS 20 MCG/5ML (4 MCG/ML) IV SYRINGE
PREFILLED_SYRINGE | INTRAVENOUS | Status: DC | PRN
  Administered 2022-04-24: 8 ug via INTRAVENOUS

## 2022-04-24 SURGICAL SUPPLY — 47 items
APL PRP STRL LF DISP 70% ISPRP (MISCELLANEOUS) ×1
BLADE SURG 15 STRL SS SAFETY (BLADE) ×1 IMPLANT
BNDG CMPR STD VLCR NS LF 5.8X4 (GAUZE/BANDAGES/DRESSINGS)
BNDG CMPR STD VLCR NS LF 5.8X6 (GAUZE/BANDAGES/DRESSINGS)
BNDG ELASTIC 4X5.8 VLCR NS LF (GAUZE/BANDAGES/DRESSINGS) IMPLANT
BNDG ELASTIC 6X5.8 VLCR NS LF (GAUZE/BANDAGES/DRESSINGS) IMPLANT
BNDG GAUZE DERMACEA FLUFF (GAUZE/BANDAGES/DRESSINGS)
BNDG GAUZE DERMACEA FLUFF 4 (GAUZE/BANDAGES/DRESSINGS) IMPLANT
BNDG GZE DERMACEA 4 6PLY (GAUZE/BANDAGES/DRESSINGS)
CANISTER WOUND CARE 500ML ATS (WOUND CARE) ×1 IMPLANT
CHLORAPREP W/TINT 26 (MISCELLANEOUS) ×1 IMPLANT
DRAPE 3/4 80X56 (DRAPES) ×1 IMPLANT
DRAPE LAPAROTOMY 100X77 ABD (DRAPES) ×1 IMPLANT
DRSG TEGADERM 4X4.75 (GAUZE/BANDAGES/DRESSINGS) IMPLANT
DRSG TELFA 3X4 N-ADH STERILE (GAUZE/BANDAGES/DRESSINGS) IMPLANT
DRSG VAC ATS MED SENSATRAC (GAUZE/BANDAGES/DRESSINGS) ×1 IMPLANT
ELECT REM PT RETURN 9FT ADLT (ELECTROSURGICAL) ×1
ELECTRODE REM PT RTRN 9FT ADLT (ELECTROSURGICAL) ×1 IMPLANT
GAUZE 4X4 16PLY ~~LOC~~+RFID DBL (SPONGE) ×1 IMPLANT
GAUZE SPONGE 4X4 12PLY STRL (GAUZE/BANDAGES/DRESSINGS) IMPLANT
GLOVE BIO SURGEON STRL SZ7.5 (GLOVE) ×1 IMPLANT
GLOVE SURG UNDER LTX SZ8 (GLOVE) ×1 IMPLANT
GOWN STRL REUS W/ TWL LRG LVL3 (GOWN DISPOSABLE) ×2 IMPLANT
GOWN STRL REUS W/TWL LRG LVL3 (GOWN DISPOSABLE) ×2
KIT TURNOVER KIT A (KITS) ×1 IMPLANT
MANIFOLD NEPTUNE II (INSTRUMENTS) ×1 IMPLANT
NS IRRIG 500ML POUR BTL (IV SOLUTION) ×1 IMPLANT
PACK BASIN MINOR ARMC (MISCELLANEOUS) ×1 IMPLANT
PAD PREP 24X41 OB/GYN DISP (PERSONAL CARE ITEMS) ×1 IMPLANT
SLEEVE SCD COMPRESS KNEE MED (STOCKING) IMPLANT
SOL PREP PVP 2OZ (MISCELLANEOUS) ×1
SOLUTION PREP PVP 2OZ (MISCELLANEOUS) ×1 IMPLANT
STOCKINETTE STRL 6IN 960660 (GAUZE/BANDAGES/DRESSINGS) IMPLANT
STRIP CLOSURE SKIN 1/2X4 (GAUZE/BANDAGES/DRESSINGS) IMPLANT
SUT ETHILON 4-0 (SUTURE)
SUT ETHILON 4-0 FS2 18XMFL BLK (SUTURE)
SUT VIC AB 2-0 CT1 (SUTURE) ×1 IMPLANT
SUT VIC AB 3-0 54X BRD REEL (SUTURE) ×1 IMPLANT
SUT VIC AB 3-0 BRD 54 (SUTURE)
SUT VIC AB 3-0 SH 27 (SUTURE)
SUT VIC AB 3-0 SH 27X BRD (SUTURE) ×1 IMPLANT
SUT VIC AB 4-0 FS2 27 (SUTURE) ×1 IMPLANT
SUTURE ETHLN 4-0 FS2 18XMF BLK (SUTURE) ×1 IMPLANT
SWAB CULTURE AMIES ANAERIB BLU (MISCELLANEOUS) IMPLANT
SWABSTK COMLB BENZOIN TINCTURE (MISCELLANEOUS) IMPLANT
TRAP FLUID SMOKE EVACUATOR (MISCELLANEOUS) ×1 IMPLANT
WATER STERILE IRR 500ML POUR (IV SOLUTION) ×1 IMPLANT
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
## 2022-04-24 NOTE — OR Nursing (Signed)
Dr. Bary Castilla in to see pt in postop 1123 am.

## 2022-04-24 NOTE — Anesthesia Preprocedure Evaluation (Addendum)
Anesthesia Evaluation  Patient identified by MRN, date of birth, ID band Patient awake    Reviewed: Allergy & Precautions, NPO status , Patient's Chart, lab work & pertinent test results  History of Anesthesia Complications Negative for: history of anesthetic complications  Airway Mallampati: IV   Neck ROM: Full    Dental  (+) Edentulous Upper, Edentulous Lower   Pulmonary COPD, Current Smoker (1 ppd)Patient did not abstain from smoking.,    Pulmonary exam normal breath sounds clear to auscultation       Cardiovascular hypertension, Normal cardiovascular exam Rhythm:Regular Rate:Normal  ECG 03/17/22: normal   Neuro/Psych negative neurological ROS     GI/Hepatic negative GI ROS,   Endo/Other  diabetes, Type 2  Renal/GU negative Renal ROS   BPH    Musculoskeletal   Abdominal   Peds  Hematology negative hematology ROS (+)   Anesthesia Other Findings   Reproductive/Obstetrics                            Anesthesia Physical Anesthesia Plan  ASA: 2  Anesthesia Plan: General   Post-op Pain Management:    Induction: Intravenous  PONV Risk Score and Plan: 1 and Ondansetron, Dexamethasone, Treatment may vary due to age or medical condition, Propofol infusion and TIVA  Airway Management Planned: Natural Airway and Nasal Cannula  Additional Equipment:   Intra-op Plan:   Post-operative Plan:   Informed Consent: I have reviewed the patients History and Physical, chart, labs and discussed the procedure including the risks, benefits and alternatives for the proposed anesthesia with the patient or authorized representative who has indicated his/her understanding and acceptance.     Dental advisory given  Plan Discussed with: CRNA  Anesthesia Plan Comments: (GETA backup.  Patient consented for risks of anesthesia including but not limited to:  - adverse reactions to medications - damage  to eyes, teeth, lips or other oral mucosa - nerve damage due to positioning  - sore throat or hoarseness - damage to heart, brain, nerves, lungs, other parts of body or loss of life  Informed patient about role of CRNA in peri- and intra-operative care.  Patient voiced understanding.)       Anesthesia Quick Evaluation

## 2022-04-24 NOTE — Op Note (Signed)
Preoperative diagnosis, wound necrosis post mastectomy.  Postoperative diagnosis: Same.  Operative procedure: Debridement of chest wall wound, wound VAC application.  Operating surgeon: Hervey Ard, MD.  Anesthesia: Monitored anesthesia care.  Estimated blood loss: Less than 5 cc.  Clinical note: This 66 year old man underwent a mastectomy with sentinel node biopsy on July 17 for a large 4.8 cm tumor involving the left breast.  He has developed a focal area of wound necrosis at the T flap and persistent drainage.  He was brought to the operating room for debridement and wound VAC application.  He received Ancef prior to the procedure.  SCD stockings for DVT prevention.  Operative note: The chest was prepped with Betadine solution and draped.  Culture was obtained of the seroma cavity.  The 3 x 6 area of necrotic tissue in the central portion of the mastectomy site was sharply debrided with hemostasis achieved electrocautery.  The flaps were undermined 3 cm medially, 5 cm cephalad and 5 cm laterally.  The areas were evaluated with ultrasound and no loculated fluid pockets remain.  The wound was copiously irrigated with saline.  A wound VAC black foam dressing was trimmed to a 3 x 6 central thickness and a 6 x 12 cm thinner flap inserted underneath the mastectomy flaps.  An adhesive dressing was applied after benzoin and the wound VAC apparatus applied with good suction.  Patient tolerated procedure well and was taken the PACU in stable condition.

## 2022-04-24 NOTE — Anesthesia Procedure Notes (Signed)
Procedure Name: MAC Date/Time: 04/24/2022 9:20 AM  Performed by: Jerrye Noble, CRNAPre-anesthesia Checklist: Patient identified, Emergency Drugs available, Suction available and Patient being monitored Patient Re-evaluated:Patient Re-evaluated prior to induction Oxygen Delivery Method: Simple face mask

## 2022-04-24 NOTE — Anesthesia Postprocedure Evaluation (Signed)
Anesthesia Post Note  Patient: Benjamin Armstrong  Procedure(s) Performed: DEBRIDEMENT WOUND (Left: Chest) APPLICATION OF WOUND VAC (Left: Chest)  Patient location during evaluation: PACU Anesthesia Type: General Level of consciousness: awake and alert, oriented and patient cooperative Pain management: pain level controlled Vital Signs Assessment: post-procedure vital signs reviewed and stable Respiratory status: spontaneous breathing, nonlabored ventilation and respiratory function stable Cardiovascular status: blood pressure returned to baseline and stable Postop Assessment: adequate PO intake Anesthetic complications: no   No notable events documented.   Last Vitals:  Vitals:   04/24/22 1035 04/24/22 1046  BP:    Pulse: 67 65  Resp: 15 18  Temp: (!) 36.1 C 36.4 C  SpO2: 94% 96%    Last Pain:  Vitals:   04/24/22 1046  TempSrc: Temporal  PainSc: [place]

## 2022-04-24 NOTE — Discharge Instructions (Signed)
AMBULATORY SURGERY  ?DISCHARGE INSTRUCTIONS ? ? ?The drugs that you were given will stay in your system until tomorrow so for the next 24 hours you should not: ? ?Drive an automobile ?Make any legal decisions ?Drink any alcoholic beverage ? ? ?You may resume regular meals tomorrow.  Today it is better to start with liquids and gradually work up to solid foods. ? ?You may eat anything you prefer, but it is better to start with liquids, then soup and crackers, and gradually work up to solid foods. ? ? ?Please notify your doctor immediately if you have any unusual bleeding, trouble breathing, redness and pain at the surgery site, drainage, fever, or pain not relieved by medication. ? ? ? ?Additional Instructions: ? ? ? ?Please contact your physician with any problems or Same Day Surgery at [phone], Monday through Friday 6 am to 4 pm, or [facility] number at [phone].  ?

## 2022-04-24 NOTE — Transfer of Care (Signed)
Immediate Anesthesia Transfer of Care Note  Patient: Benjamin Armstrong  Procedure(s) Performed: DEBRIDEMENT WOUND (Left: Chest) APPLICATION OF WOUND VAC (Left: Chest)  Patient Location: PACU  Anesthesia Type:General  Level of Consciousness: awake, drowsy and patient cooperative  Airway & Oxygen Therapy: Patient Spontanous Breathing and Patient connected to face mask oxygen  Post-op Assessment: Report given to RN and Post -op Vital signs reviewed and stable  Post vital signs: Reviewed and stable  Last Vitals:  Vitals Value Taken Time  BP 143/53 04/24/22 1001  Temp    Pulse 75 04/24/22 1002  Resp 21 04/24/22 1002  SpO2 99 % 04/24/22 1002  Vitals shown include unvalidated device data.  Last Pain:  Vitals:   04/24/22 0728  TempSrc: Oral  PainSc: 0-No pain      Patients Stated Pain Goal: 0 (65/03/54 6568)  Complications: No notable events documented.

## 2022-04-25 ENCOUNTER — Encounter: Payer: Self-pay | Admitting: General Surgery

## 2022-04-25 NOTE — Telephone Encounter (Signed)
Results scanned in chart 

## 2022-04-26 ENCOUNTER — Encounter: Payer: Self-pay | Admitting: Internal Medicine

## 2022-04-27 LAB — AEROBIC CULTURE W GRAM STAIN (SUPERFICIAL SPECIMEN)
Culture: NO GROWTH
Gram Stain: NONE SEEN

## 2022-04-28 ENCOUNTER — Encounter: Payer: Self-pay | Admitting: Internal Medicine

## 2022-04-28 ENCOUNTER — Inpatient Hospital Stay (HOSPITAL_BASED_OUTPATIENT_CLINIC_OR_DEPARTMENT_OTHER): Payer: Medicare Other | Admitting: Internal Medicine

## 2022-04-28 DIAGNOSIS — Z170 Estrogen receptor positive status [ER+]: Secondary | ICD-10-CM

## 2022-04-28 DIAGNOSIS — C50822 Malignant neoplasm of overlapping sites of left male breast: Secondary | ICD-10-CM | POA: Diagnosis not present

## 2022-04-28 MED ORDER — TAMOXIFEN CITRATE 20 MG PO TABS: 20.0000 mg | ORAL_TABLET | Freq: Every day | ORAL | 1 refills | Status: DC
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
## 2022-04-28 NOTE — Assessment & Plan Note (Addendum)
#    JULY 2023- male breast cancer T2 [4.8cm] N1- micro met; ER strongly positive; PR positive; lymphovascular invasion grade 3. I reviewed the pathology/staging in detail.  Margins are negative.  #I did review with the patient/sister concerning findings on pathology [size; micro met; LVI grade 3]; and the risk of recurrence.  Recommend Oncotype of further risk assessment; also the benefit from chemotherapy.  #Also discussed regarding postmastectomy radiation given the size; grade of tumor; LVI-patient is again very reluctant at this time.  Discussed with Dr. Aggie Cosier; next evaluation.  Will discuss with patient next visit/after Oncotype available.  Also messaged Dr. Lemar Livings.  # Discussed the role of anti-hormonal therapy mechanism of action- recommend tamoxifen 5 to 10 years.  I would recommend tamoxifen for 5 years.  Long discussion regarding the potential adverse events on tamoxifen including but not limited to hot flashes, joint aches etc. prescription given for insurance reasons.  Patient not to start until next visit.  # Post mastectomy radiation-   # DISPOSITION: # refer to Dr.Chrystal re: postmastectomy radiation # follow up in 2 months- MD; no labs-  Dr.B

## 2022-04-28 NOTE — Progress Notes (Unsigned)
Patient denies new problems/concerns today.   °

## 2022-05-03 ENCOUNTER — Other Ambulatory Visit: Payer: Self-pay

## 2022-05-03 DIAGNOSIS — I152 Hypertension secondary to endocrine disorders: Secondary | ICD-10-CM

## 2022-05-03 DIAGNOSIS — R0609 Other forms of dyspnea: Secondary | ICD-10-CM

## 2022-05-03 DIAGNOSIS — E785 Hyperlipidemia, unspecified: Secondary | ICD-10-CM

## 2022-05-03 MED ORDER — ROSUVASTATIN CALCIUM 5 MG PO TABS: 5.0000 mg | ORAL_TABLET | Freq: Every day | ORAL | 1 refills | Status: DC

## 2022-05-03 MED ORDER — LOSARTAN POTASSIUM 100 MG PO TABS: 100.0000 mg | ORAL_TABLET | Freq: Every day | ORAL | 1 refills | Status: DC

## 2022-05-03 MED ORDER — ALBUTEROL SULFATE HFA 108 (90 BASE) MCG/ACT IN AERS: 2.0000 | INHALATION_SPRAY | Freq: Four times a day (QID) | RESPIRATORY_TRACT | 0 refills | Status: DC | PRN

## 2022-05-03 MED ORDER — LOSARTAN POTASSIUM 100 MG PO TABS: 100.0000 mg | ORAL_TABLET | Freq: Every day | ORAL | 0 refills | Status: DC

## 2022-05-03 NOTE — Addendum Note (Signed)
Addended by: Fulton Mole D on: 05/03/2022 02:29 PM   Modules accepted: Orders

## 2022-05-04 LAB — BASIC METABOLIC PANEL
BUN: 16 (ref 4–21)
CO2: 30 — AB (ref 13–22)
Chloride: 100 (ref 99–108)
Creatinine: 0.9 (ref <=1.3)
Glucose: 100
Potassium: 4 mEq/L (ref 3.5–5.1)
Sodium: 137 (ref 137–147)

## 2022-05-04 LAB — COMPREHENSIVE METABOLIC PANEL: Calcium: 9.4 (ref 8.7–10.7)

## 2022-05-10 ENCOUNTER — Institutional Professional Consult (permissible substitution): Payer: Medicare Other | Admitting: Radiation Oncology

## 2022-05-16 ENCOUNTER — Ambulatory Visit
Admission: RE | Admit: 2022-05-16 | Discharge: 2022-05-16 | Disposition: A | Discharge status: Home / Self Care | Payer: Medicare Other | Source: Ambulatory Visit | Attending: Radiation Oncology | Admitting: Radiation Oncology

## 2022-05-16 ENCOUNTER — Encounter: Payer: Self-pay | Admitting: Radiation Oncology

## 2022-05-16 ENCOUNTER — Ambulatory Visit: Payer: Medicare Other | Admitting: Family Medicine

## 2022-05-16 VITALS — BP 143/70 | HR 63 | Temp 96.1°F | Resp 18 | Ht 74.0 in | Wt 242.6 lb

## 2022-05-16 DIAGNOSIS — K449 Diaphragmatic hernia without obstruction or gangrene: Secondary | ICD-10-CM | POA: Insufficient documentation

## 2022-05-16 DIAGNOSIS — Z9012 Acquired absence of left breast and nipple: Secondary | ICD-10-CM | POA: Diagnosis not present

## 2022-05-16 DIAGNOSIS — E119 Type 2 diabetes mellitus without complications: Secondary | ICD-10-CM | POA: Insufficient documentation

## 2022-05-16 DIAGNOSIS — Z7984 Long term (current) use of oral hypoglycemic drugs: Secondary | ICD-10-CM | POA: Diagnosis not present

## 2022-05-16 DIAGNOSIS — Z801 Family history of malignant neoplasm of trachea, bronchus and lung: Secondary | ICD-10-CM | POA: Diagnosis not present

## 2022-05-16 DIAGNOSIS — C50822 Malignant neoplasm of overlapping sites of left male breast: Secondary | ICD-10-CM | POA: Insufficient documentation

## 2022-05-16 DIAGNOSIS — I10 Essential (primary) hypertension: Secondary | ICD-10-CM | POA: Insufficient documentation

## 2022-05-16 DIAGNOSIS — Z806 Family history of leukemia: Secondary | ICD-10-CM | POA: Diagnosis not present

## 2022-05-16 DIAGNOSIS — Z79899 Other long term (current) drug therapy: Secondary | ICD-10-CM | POA: Insufficient documentation

## 2022-05-16 DIAGNOSIS — Z8052 Family history of malignant neoplasm of bladder: Secondary | ICD-10-CM | POA: Insufficient documentation

## 2022-05-16 DIAGNOSIS — M129 Arthropathy, unspecified: Secondary | ICD-10-CM | POA: Insufficient documentation

## 2022-05-16 DIAGNOSIS — Z170 Estrogen receptor positive status [ER+]: Secondary | ICD-10-CM | POA: Insufficient documentation

## 2022-05-16 DIAGNOSIS — F17210 Nicotine dependence, cigarettes, uncomplicated: Secondary | ICD-10-CM | POA: Insufficient documentation
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
## 2022-05-23 ENCOUNTER — Other Ambulatory Visit: Payer: Self-pay

## 2022-05-23 MED ORDER — SILVER SULFADIAZINE 1 % EX CREA
TOPICAL_CREAM | CUTANEOUS | 3 refills | Status: DC
  Filled 2022-05-23: qty 50, 15d supply, fill #0

## 2022-05-24 ENCOUNTER — Other Ambulatory Visit: Payer: Self-pay

## 2022-05-26 ENCOUNTER — Ambulatory Visit (INDEPENDENT_AMBULATORY_CARE_PROVIDER_SITE_OTHER): Payer: Medicare Other | Admitting: Family Medicine

## 2022-05-26 ENCOUNTER — Other Ambulatory Visit: Payer: Self-pay

## 2022-05-26 ENCOUNTER — Encounter: Payer: Self-pay | Admitting: Family Medicine

## 2022-05-26 DIAGNOSIS — E785 Hyperlipidemia, unspecified: Secondary | ICD-10-CM | POA: Diagnosis not present

## 2022-05-26 DIAGNOSIS — E119 Type 2 diabetes mellitus without complications: Secondary | ICD-10-CM | POA: Diagnosis not present

## 2022-05-26 DIAGNOSIS — I152 Hypertension secondary to endocrine disorders: Secondary | ICD-10-CM

## 2022-05-26 DIAGNOSIS — R0609 Other forms of dyspnea: Secondary | ICD-10-CM

## 2022-05-26 DIAGNOSIS — C50822 Malignant neoplasm of overlapping sites of left male breast: Secondary | ICD-10-CM | POA: Diagnosis not present

## 2022-05-26 DIAGNOSIS — M7020 Olecranon bursitis, unspecified elbow: Secondary | ICD-10-CM | POA: Insufficient documentation

## 2022-05-26 DIAGNOSIS — M7021 Olecranon bursitis, right elbow: Secondary | ICD-10-CM

## 2022-05-26 DIAGNOSIS — Z170 Estrogen receptor positive status [ER+]: Secondary | ICD-10-CM

## 2022-05-26 MED ORDER — LOSARTAN POTASSIUM 100 MG PO TABS: 100.0000 mg | ORAL_TABLET | Freq: Every day | ORAL | 0 refills | Status: DC

## 2022-05-26 MED ORDER — BLOOD GLUCOSE MONITOR KIT: PACK | 0 refills | Status: DC

## 2022-05-26 MED ORDER — SPIRIVA RESPIMAT 2.5 MCG/ACT IN AERS: 2.0000 | INHALATION_SPRAY | Freq: Every day | RESPIRATORY_TRACT | 3 refills | Status: DC

## 2022-05-26 MED ORDER — UMECLIDINIUM BROMIDE 62.5 MCG/ACT IN AEPB: 1.0000 | INHALATION_SPRAY | Freq: Every day | RESPIRATORY_TRACT | 3 refills | Status: DC

## 2022-05-26 NOTE — Assessment & Plan Note (Signed)
Discussed option of monitoring versus seeing orthopedics for drainage.  Patient opted to monitor.  He will pad the area and he can use ice on it.  Discussed not using ice for more than 10 minutes at a time.  Discussed if he develops any signs of infection such as increased swelling, pain, redness, or fevers he needs to seek medical attention immediately.  Discussed he could go to orthopedic urgent care to have this drained if it became worse.

## 2022-05-26 NOTE — Assessment & Plan Note (Signed)
I discussed with the patient that his elevated A1c is indicate that he does have diabetes.  I encouraged him to check his sugar fasting and a glucometer will be sent to his pharmacy.  He will try to remain active and monitor his diet.

## 2022-05-26 NOTE — Patient Instructions (Signed)
Nice to see you. We will get lab work today and contact you with the results. If you have any signs of infection such as redness, increased swelling, pain, or fevers with your elbow please get evaluated as we discussed.

## 2022-05-26 NOTE — Assessment & Plan Note (Signed)
Likely COPD related.  We will send Incruse to his pharmacy.

## 2022-05-26 NOTE — Progress Notes (Signed)
Benjamin Rumps, MD Phone: [phone]  Benjamin Armstrong is a 66 y.o. male who presents today for follow-up.  Breast cancer: Patient status post surgical intervention for this.  He has seen oncology and radiation oncology.  Oncology discussed chemotherapy with the patient.  He has declined this at this time as he notes it is not worth the 5% increase in survival for the risk of side effects.  He is also considering radiation though is unsure if he will proceed with this as well.  He notes his wound is progressively healing after having a wound VAC in place.  Hyperlipidemia: Patient is taking Crestor.  No chest pain, right upper quadrant pain, or myalgias.  Diabetes: Patient is not on any medication for this.  He never received a glucometer to check his sugar.  He notes he does not have diabetes.  Right elbow enlargement: Patient notes he bumped his elbow on a counter and his olecranon area became enlarged.  There is no pain or erythema.  No fevers.  Its been present for a week or so.  Social History   Tobacco Use  Smoking Status Every Day   Packs/day: 1.00   Years: 53.00   Total pack years: 53.00   Types: Cigarettes  Smokeless Tobacco Never    Current Outpatient Medications on File Prior to Visit  Medication Sig Dispense Refill   Accu-Chek Softclix Lancets lancets Check sugar fasting and 2 hours after one meal each day 100 each 3   albuterol (VENTOLIN HFA) 108 (90 Base) MCG/ACT inhaler Inhale 2 puffs into the lungs every 6 (six) hours as needed for wheezing or shortness of breath. 8 g 0   EPINEPHrine 0.3 mg/0.3 mL IJ SOAJ injection Inject 0.3 mg into the muscle as needed for anaphylaxis. 2 each 1   Famotidine (PEPCID AC PO) Take 1 tablet by mouth as needed.     furosemide (LASIX) 20 MG tablet Take 1 tablet (20 mg total) by mouth daily as needed. For swelling 90 tablet 0   glucose blood test strip Check sugar fasting and 2 hours after one meal each day 100 each 12    HYDROcodone-acetaminophen (NORCO/VICODIN) 5-325 MG tablet Take 1 tablet by mouth every 4 (four) hours as needed for moderate pain. 20 tablet 0   metFORMIN (GLUCOPHAGE) 500 MG tablet Take 1 tablet (500 mg total) by mouth 2 (two) times daily with a meal. 180 tablet 3   naproxen sodium (ALEVE) 220 MG tablet Take 220 mg by mouth 2 (two) times daily as needed.     rosuvastatin (CRESTOR) 5 MG tablet Take 1 tablet (5 mg total) by mouth daily. 90 tablet 1   sildenafil (REVATIO) 20 MG tablet 1-5 tablets as needed one hour prior to intercourse 30 tablet 2   silver sulfADIAZINE (SILVADENE) 1 % cream Apply a thin layer to wound after shower daily 50 g 3   tamoxifen (NOLVADEX) 20 MG tablet Take 1 tablet (20 mg total) by mouth daily. DO NOT START UNTIL directed by MD. 90 tablet 1   tamsulosin (FLOMAX) 0.4 MG CAPS capsule Take 1 capsule (0.4 mg total) by mouth daily. 90 capsule 3   No current facility-administered medications on file prior to visit.     ROS see history of present illness  Objective  Physical Exam Vitals:   05/26/22 1437  BP: 120/60  Pulse: 69  Temp: 97.6 F (36.4 C)  SpO2: 98%    BP Readings from Last 3 Encounters:  05/26/22 120/60  05/16/22 [name] Kitchen)  143/70  04/28/22 139/62   Wt Readings from Last 3 Encounters:  05/26/22 245 lb 6.4 oz (111.3 kg)  05/16/22 242 lb 9.6 oz (110 kg)  04/28/22 247 lb (112 kg)    Physical Exam Constitutional:      General: He is not in acute distress.    Appearance: He is not diaphoretic.  Cardiovascular:     Rate and Rhythm: Normal rate and regular rhythm.     Heart sounds: Normal heart sounds.  Pulmonary:     Effort: Pulmonary effort is normal.     Breath sounds: Normal breath sounds.  Musculoskeletal:     Comments: Right olecranon bursa enlarged, no overlying erythema, no tenderness  Skin:    General: Skin is warm and dry.  Neurological:     Mental Status: He is alert.      Assessment/Plan: Please see individual problem  list.  Problem List Items Addressed This Visit     DOE (dyspnea on exertion) (Chronic)    Likely COPD related.  We will send Incruse to his pharmacy.      Relevant Medications   umeclidinium bromide (INCRUSE ELLIPTA) 62.5 MCG/ACT AEPB   Hyperlipidemia (Chronic)    Check labs.      Relevant Orders   Direct LDL   Hepatic function panel   Type 2 diabetes mellitus ([facility]) (Chronic)    I discussed with the patient that his elevated A1c is indicate that he does have diabetes.  I encouraged him to check his sugar fasting and a glucometer will be sent to his pharmacy.  He will try to remain active and monitor his diet.      Relevant Medications   blood glucose meter kit and supplies KIT   Other Relevant Orders   HgB A1c   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, estrogen receptor positive ([place])    He will continue to see a specialist.  I had a general discussion with the patient regarding the purpose of chemotherapy and radiation for the treatment of his breast cancer.  Discussed that his specialists would be the best people to discuss this with.  He notes he is not going to do chemotherapy given the side effects are not worth it for the potential benefit.  He is still undecided on radiation therapy and he is going to discuss this further with his surgeon.  He has a good understanding of the risks and benefits of these potential treatments based on his prior discussions with his specialists.      Olecranon bursitis    Discussed option of monitoring versus seeing orthopedics for drainage.  Patient opted to monitor.  He will pad the area and he can use ice on it.  Discussed not using ice for more than 10 minutes at a time.  Discussed if he develops any signs of infection such as increased swelling, pain, redness, or fevers he needs to seek medical attention immediately.  Discussed he could go to orthopedic urgent care to have this drained if it became worse.       Return in about  3 months (around 08/25/2022).   Benjamin Rumps, MD [place]

## 2022-05-26 NOTE — Assessment & Plan Note (Addendum)
He will continue to see a specialist.  I had a general discussion with the patient regarding the purpose of chemotherapy and radiation for the treatment of his breast cancer.  Discussed that his specialists would be the best people to discuss this with.  He notes he is not going to do chemotherapy given the side effects are not worth it for the potential benefit.  He is still undecided on radiation therapy and he is going to discuss this further with his surgeon.  He has a good understanding of the risks and benefits of these potential treatments based on his prior discussions with his specialists.

## 2022-05-26 NOTE — Assessment & Plan Note (Signed)
Check labs 

## 2022-05-27 LAB — HEMOGLOBIN A1C
Hgb A1c MFr Bld: 6 % of total Hgb — ABNORMAL HIGH (ref <5.7)
Mean Plasma Glucose: 126 mg/dL
eAG (mmol/L): 7 mmol/L

## 2022-05-27 LAB — HEPATIC FUNCTION PANEL
AG Ratio: 1.4 (calc) (ref 1.0–2.5)
ALT: 17 U/L (ref 9–46)
AST: 13 U/L (ref 10–35)
Albumin: 3.8 g/dL (ref 3.6–5.1)
Alkaline phosphatase (APISO): 63 U/L (ref 35–144)
Bilirubin, Direct: 0 mg/dL (ref 0.0–0.2)
Globulin: 2.8 g/dL (calc) (ref 1.9–3.7)
Indirect Bilirubin: 0.2 mg/dL (calc) (ref 0.2–1.2)
Total Bilirubin: 0.2 mg/dL (ref 0.2–1.2)
Total Protein: 6.6 g/dL (ref 6.1–8.1)

## 2022-05-27 LAB — LDL CHOLESTEROL, DIRECT: Direct LDL: 106 mg/dL — ABNORMAL HIGH (ref <100)

## 2022-05-30 ENCOUNTER — Other Ambulatory Visit: Payer: Self-pay

## 2022-05-30 DIAGNOSIS — E785 Hyperlipidemia, unspecified: Secondary | ICD-10-CM

## 2022-05-30 DIAGNOSIS — I152 Hypertension secondary to endocrine disorders: Secondary | ICD-10-CM

## 2022-05-30 MED ORDER — ROSUVASTATIN CALCIUM 10 MG PO TABS: 10.0000 mg | ORAL_TABLET | Freq: Every day | ORAL | 0 refills | Status: DC

## 2022-06-05 ENCOUNTER — Ambulatory Visit: Payer: Medicare Other | Attending: Cardiology | Admitting: Cardiology

## 2022-06-05 ENCOUNTER — Encounter: Payer: Self-pay | Admitting: Cardiology

## 2022-06-05 ENCOUNTER — Ambulatory Visit: Admission: RE | Admit: 2022-06-05 | Payer: Medicare Other | Source: Ambulatory Visit

## 2022-06-05 ENCOUNTER — Other Ambulatory Visit: Payer: Self-pay | Admitting: Family Medicine

## 2022-06-05 VITALS — BP 134/83 | HR 69 | Ht 74.0 in | Wt 249.0 lb

## 2022-06-05 DIAGNOSIS — F17200 Nicotine dependence, unspecified, uncomplicated: Secondary | ICD-10-CM | POA: Insufficient documentation

## 2022-06-05 DIAGNOSIS — E782 Mixed hyperlipidemia: Secondary | ICD-10-CM | POA: Diagnosis not present

## 2022-06-05 DIAGNOSIS — R0609 Other forms of dyspnea: Secondary | ICD-10-CM

## 2022-06-05 DIAGNOSIS — I10 Essential (primary) hypertension: Secondary | ICD-10-CM | POA: Diagnosis not present

## 2022-06-05 MED ORDER — FUROSEMIDE 20 MG PO TABS: 40.0000 mg | ORAL_TABLET | Freq: Every day | ORAL | 1 refills | Status: DC

## 2022-06-05 MED ORDER — SPIRIVA RESPIMAT 2.5 MCG/ACT IN AERS: 2.0000 | INHALATION_SPRAY | Freq: Every day | RESPIRATORY_TRACT | 0 refills | Status: DC
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
## 2022-06-05 NOTE — Progress Notes (Signed)
Medication Samples have been provided to the patient.  Drug name: Spiriva       Strength: 2.5 mcg        Qty: 2  LOT: [id number]  Exp.Date: 004/28/2024  Dosing instructions: inhale 2 puffs into the lungs daily.    The patient has been instructed regarding the correct time, dose, and frequency of taking this medication, including desired effects and most common side effects.   Gordy Councilman 1:50 PM 06/05/2022

## 2022-06-05 NOTE — Patient Instructions (Signed)
Medication Instructions:   Your physician recommends that you continue on your current medications as directed. Please refer to the Current Medication list given to you today.  *If you need a refill on your cardiac medications before your next appointment, please call your pharmacy*   Follow-Up: At [facility], you and your health needs are our priority.  As part of our continuing mission to provide you with exceptional heart care, we have created designated Provider Care Teams.  These Care Teams include your primary Cardiologist (physician) and Advanced Practice Providers (APPs -  Physician Assistants and Nurse Practitioners) who all work together to provide you with the care you need, when you need it.  We recommend signing up for the patient portal called "MyChart".  Sign up information is provided on this After Visit Summary.  MyChart is used to connect with patients for Virtual Visits (Telemedicine).  Patients are able to view lab/test results, encounter notes, upcoming appointments, etc.  Non-urgent messages can be sent to your provider as well.   To learn more about what you can do with MyChart, go to [email or web address].    Your next appointment:   6 month(s)  The format for your next appointment:   In Person  Provider:   You may see Kate Sable, MD or one of the following Advanced Practice Providers on your designated Care Team:   Murray Hodgkins, NP Christell Faith, PA-C Cadence Kathlen Mody, PA-C Gerrie Nordmann, NP    Other Instructions   Important Information About Sugar

## 2022-06-07 ENCOUNTER — Institutional Professional Consult (permissible substitution): Payer: Medicare Other | Admitting: Radiation Oncology

## 2022-06-12 ENCOUNTER — Ambulatory Visit: Payer: Medicare Other

## 2022-06-13 ENCOUNTER — Ambulatory Visit: Payer: Medicare Other

## 2022-06-14 ENCOUNTER — Ambulatory Visit: Payer: Medicare Other

## 2022-06-15 ENCOUNTER — Ambulatory Visit: Payer: Medicare Other

## 2022-06-16 ENCOUNTER — Ambulatory Visit: Payer: Medicare Other

## 2022-06-19 ENCOUNTER — Ambulatory Visit
Admission: RE | Admit: 2022-06-19 | Discharge: 2022-06-19 | Disposition: A | Discharge status: Home / Self Care | Payer: Medicare Other | Source: Ambulatory Visit | Attending: Radiation Oncology | Admitting: Radiation Oncology

## 2022-06-19 ENCOUNTER — Other Ambulatory Visit: Payer: Self-pay

## 2022-06-19 ENCOUNTER — Other Ambulatory Visit: Payer: Self-pay | Admitting: Family Medicine

## 2022-06-19 ENCOUNTER — Ambulatory Visit: Payer: Medicare Other

## 2022-06-19 DIAGNOSIS — C50822 Malignant neoplasm of overlapping sites of left male breast: Secondary | ICD-10-CM | POA: Diagnosis present

## 2022-06-19 DIAGNOSIS — Z510 Encounter for antineoplastic radiation therapy: Secondary | ICD-10-CM | POA: Diagnosis present

## 2022-06-19 DIAGNOSIS — R0609 Other forms of dyspnea: Secondary | ICD-10-CM

## 2022-06-19 DIAGNOSIS — I152 Hypertension secondary to endocrine disorders: Secondary | ICD-10-CM

## 2022-06-19 MED ORDER — LOSARTAN POTASSIUM 100 MG PO TABS: 100.0000 mg | ORAL_TABLET | Freq: Every day | ORAL | 1 refills | Status: DC

## 2022-06-20 ENCOUNTER — Ambulatory Visit: Payer: Medicare Other

## 2022-06-21 ENCOUNTER — Ambulatory Visit: Payer: Medicare Other

## 2022-06-22 ENCOUNTER — Telehealth: Payer: Self-pay | Admitting: Family Medicine

## 2022-06-22 ENCOUNTER — Telehealth: Payer: Self-pay | Admitting: Pharmacist

## 2022-06-22 ENCOUNTER — Ambulatory Visit: Payer: Medicare Other

## 2022-06-22 DIAGNOSIS — Z510 Encounter for antineoplastic radiation therapy: Secondary | ICD-10-CM | POA: Diagnosis not present

## 2022-06-22 NOTE — Progress Notes (Signed)
[facility]) Care Management  [place]   06/22/2022  Benjamin Armstrong Aug 28, 1956 [id number]  Reason for referral: Medication Assistance for Spiriva   Medication Assistance Findings:  Medication assistance needs identified: Spiriva    Additional medication assistance options reviewed with patient as warranted:  No other options identified  Plan: I will route patient assistance letter to [place] technician who will coordinate patient assistance program application process for medications listed above.  [facility] pharmacy technician will assist with obtaining all required documents from both patient and provider(s) and submit application(s) once completed.    Curlene Labrum, PharmD [place] Pharmacist Office: [phone]

## 2022-06-22 NOTE — Telephone Encounter (Signed)
Pt sister called stating pt was given samples of an spirva inhaler and sister stated he is doing better. Pt sister stated it is really helping but he can not afford the medication

## 2022-06-22 NOTE — Telephone Encounter (Signed)
I called and spoke with the patient and informed him that the provider stated he could do a referral for patient assistance patient agreed and the referral was faxed to [facility].  Arien Morine,cma

## 2022-06-22 NOTE — Telephone Encounter (Signed)
Please see if they would be ok with a referral to the pharmacist to work on patient assistance for this. Thanks.

## 2022-06-23 ENCOUNTER — Other Ambulatory Visit: Payer: Self-pay | Admitting: *Deleted

## 2022-06-23 ENCOUNTER — Telehealth: Payer: Self-pay

## 2022-06-23 ENCOUNTER — Ambulatory Visit: Payer: Medicare Other

## 2022-06-23 DIAGNOSIS — Z170 Estrogen receptor positive status [ER+]: Secondary | ICD-10-CM

## 2022-06-23 DIAGNOSIS — Z596 Low income: Secondary | ICD-10-CM

## 2022-06-23 NOTE — Progress Notes (Signed)
[facility])                                            [place] Team    06/23/2022  Ardie Mclennan 10-13-1955 [id number]                                                                       Medication Assistance Referral  Referral From:  [facility] [place]  Medication/Company: [employer] / Spiriva Patient application portion:  Mailed Provider application portion: Faxed  to Dr. Caryl Bis Provider address/fax verified via: Office website  Lelon Huh, [place] [phone]

## 2022-06-26 ENCOUNTER — Ambulatory Visit: Payer: Medicare Other

## 2022-06-26 ENCOUNTER — Ambulatory Visit
Admission: RE | Admit: 2022-06-26 | Discharge: 2022-06-26 | Disposition: A | Discharge status: Home / Self Care | Payer: Medicare Other | Source: Ambulatory Visit | Attending: Radiation Oncology | Admitting: Radiation Oncology

## 2022-06-26 DIAGNOSIS — Z510 Encounter for antineoplastic radiation therapy: Secondary | ICD-10-CM | POA: Diagnosis not present

## 2022-06-27 ENCOUNTER — Ambulatory Visit
Admission: RE | Admit: 2022-06-27 | Discharge: 2022-06-27 | Disposition: A | Discharge status: Home / Self Care | Payer: Medicare Other | Source: Ambulatory Visit | Attending: Radiation Oncology | Admitting: Radiation Oncology

## 2022-06-27 ENCOUNTER — Ambulatory Visit: Payer: Medicare Other

## 2022-06-27 ENCOUNTER — Other Ambulatory Visit: Payer: Self-pay

## 2022-06-27 ENCOUNTER — Telehealth: Payer: Self-pay | Admitting: Cardiology

## 2022-06-27 DIAGNOSIS — Z510 Encounter for antineoplastic radiation therapy: Secondary | ICD-10-CM | POA: Diagnosis not present

## 2022-06-27 LAB — RAD ONC ARIA SESSION SUMMARY
Course Elapsed Days: 0
Plan Fractions Treated to Date: 1
Plan Prescribed Dose Per Fraction: 1.8 Gy
Plan Total Fractions Prescribed: 28
Plan Total Prescribed Dose: 50.4 Gy
Reference Point Dosage Given to Date: 1.8 Gy
Reference Point Session Dosage Given: 1.8 Gy
Session Number: 1

## 2022-06-27 MED ORDER — FUROSEMIDE 20 MG PO TABS: 40.0000 mg | ORAL_TABLET | Freq: Every day | ORAL | 3 refills | Status: DC

## 2022-06-27 NOTE — Telephone Encounter (Signed)
  *  STAT* If patient is at the pharmacy, call can be transferred to refill team.   1. Which medications need to be refilled? (please list name of each medication and dose if known) furosemide (LASIX) 20 MG tablet  2. Which pharmacy/location (including street and city if local pharmacy) is medication to be sent to? [id number] [place] [facility] [address]  3. Do they need a 30 day or 90 day supply? 90 days  Per pt's sister, they're [facility] because [facility] to get refills there

## 2022-06-28 ENCOUNTER — Ambulatory Visit: Payer: Medicare Other

## 2022-06-28 ENCOUNTER — Ambulatory Visit
Admission: RE | Admit: 2022-06-28 | Discharge: 2022-06-28 | Disposition: A | Discharge status: Home / Self Care | Payer: Medicare Other | Source: Ambulatory Visit | Attending: Radiation Oncology | Admitting: Radiation Oncology

## 2022-06-28 ENCOUNTER — Other Ambulatory Visit: Payer: Self-pay | Admitting: Family Medicine

## 2022-06-28 ENCOUNTER — Other Ambulatory Visit: Payer: Self-pay

## 2022-06-28 DIAGNOSIS — N401 Enlarged prostate with lower urinary tract symptoms: Secondary | ICD-10-CM

## 2022-06-28 DIAGNOSIS — Z510 Encounter for antineoplastic radiation therapy: Secondary | ICD-10-CM | POA: Diagnosis not present

## 2022-06-28 LAB — RAD ONC ARIA SESSION SUMMARY
Course Elapsed Days: 1
Plan Fractions Treated to Date: 2
Plan Prescribed Dose Per Fraction: 1.8 Gy
Plan Total Fractions Prescribed: 28
Plan Total Prescribed Dose: 50.4 Gy
Reference Point Dosage Given to Date: 3.6 Gy
Reference Point Session Dosage Given: 1.8 Gy
Session Number: 2

## 2022-06-28 MED ORDER — TAMSULOSIN HCL 0.4 MG PO CAPS: 0.4000 mg | ORAL_CAPSULE | Freq: Every day | ORAL | 3 refills | Status: DC

## 2022-06-28 NOTE — Telephone Encounter (Signed)
Pt sister called stating pt has not received the patient assistance form yet and he is out of spirva

## 2022-06-28 NOTE — Telephone Encounter (Signed)
Spoke with the patient and let him know that Patient assistance is still working on his assistance with Spiriva, I am giving him 2 samples per Juliann Pulse to last him until the patient assistance is done, He stated his sister will pick them up today. Remy Voiles,cma

## 2022-06-28 NOTE — Telephone Encounter (Signed)
Message was sent to [facility] [place] to check the status of the patient assistant.  Pierce Barocio,cma

## 2022-06-29 ENCOUNTER — Ambulatory Visit: Payer: Medicare Other

## 2022-06-29 ENCOUNTER — Other Ambulatory Visit: Payer: Self-pay

## 2022-06-29 ENCOUNTER — Ambulatory Visit
Admission: RE | Admit: 2022-06-29 | Discharge: 2022-06-29 | Disposition: A | Discharge status: Home / Self Care | Payer: Medicare Other | Source: Ambulatory Visit | Attending: Radiation Oncology | Admitting: Radiation Oncology

## 2022-06-29 DIAGNOSIS — Z510 Encounter for antineoplastic radiation therapy: Secondary | ICD-10-CM | POA: Diagnosis not present

## 2022-06-29 LAB — RAD ONC ARIA SESSION SUMMARY
Course Elapsed Days: 2
Plan Fractions Treated to Date: 3
Plan Prescribed Dose Per Fraction: 1.8 Gy
Plan Total Fractions Prescribed: 28
Plan Total Prescribed Dose: 50.4 Gy
Reference Point Dosage Given to Date: 5.4 Gy
Reference Point Session Dosage Given: 1.8 Gy
Session Number: 3

## 2022-06-30 ENCOUNTER — Ambulatory Visit
Admission: RE | Admit: 2022-06-30 | Discharge: 2022-06-30 | Disposition: A | Discharge status: Home / Self Care | Payer: Medicare Other | Source: Ambulatory Visit | Attending: Radiation Oncology | Admitting: Radiation Oncology

## 2022-06-30 ENCOUNTER — Ambulatory Visit: Payer: Medicare Other

## 2022-06-30 ENCOUNTER — Other Ambulatory Visit: Payer: Self-pay

## 2022-06-30 DIAGNOSIS — Z510 Encounter for antineoplastic radiation therapy: Secondary | ICD-10-CM | POA: Diagnosis not present

## 2022-06-30 LAB — RAD ONC ARIA SESSION SUMMARY
Course Elapsed Days: 3
Plan Fractions Treated to Date: 4
Plan Prescribed Dose Per Fraction: 1.8 Gy
Plan Total Fractions Prescribed: 28
Plan Total Prescribed Dose: 50.4 Gy
Reference Point Dosage Given to Date: 7.2 Gy
Reference Point Session Dosage Given: 1.8 Gy
Session Number: 4

## 2022-07-03 ENCOUNTER — Ambulatory Visit: Payer: Medicare Other

## 2022-07-03 ENCOUNTER — Ambulatory Visit
Admission: RE | Admit: 2022-07-03 | Discharge: 2022-07-03 | Disposition: A | Discharge status: Home / Self Care | Payer: Medicare Other | Source: Ambulatory Visit | Attending: Radiation Oncology | Admitting: Radiation Oncology

## 2022-07-03 ENCOUNTER — Other Ambulatory Visit: Payer: Self-pay

## 2022-07-03 ENCOUNTER — Inpatient Hospital Stay: Payer: Medicare Other | Admitting: Internal Medicine

## 2022-07-03 DIAGNOSIS — Z510 Encounter for antineoplastic radiation therapy: Secondary | ICD-10-CM | POA: Diagnosis not present

## 2022-07-03 LAB — RAD ONC ARIA SESSION SUMMARY
Course Elapsed Days: 6
Plan Fractions Treated to Date: 5
Plan Prescribed Dose Per Fraction: 1.8 Gy
Plan Total Fractions Prescribed: 28
Plan Total Prescribed Dose: 50.4 Gy
Reference Point Dosage Given to Date: 9 Gy
Reference Point Session Dosage Given: 1.8 Gy
Session Number: 5

## 2022-07-03 NOTE — Assessment & Plan Note (Deleted)
#  JULY 2023- male breast cancer T2 [4.8cm] N1- micro met; ER strongly positive; PR positive; lymphovascular invasion grade 3. I reviewed the pathology/staging in detail.  Margins are negative.  #I did review with the patient/sister concerning findings on pathology [size; micro met; LVI grade 3]; and the risk of recurrence.   Oncotype-recurrence score of 27-unfortunately portends high risk disease.  Discussed role of chemotherapy to decrease the risk of recurrence.  However patient is concerned about his recovery/potential side effects from chemotherapy and also his potential loss of work/savings while on therapy.    #Also discussed regarding postmastectomy radiation given the size; grade of tumor; LVI-patient is again very reluctant at this time.  However discussed with Dr. Donella Stade; will make a referral to discuss further.  # #Discussed the role of anti-hormonal therapy mechanism of action.  I would recommend tamoxifen for 5-10 years.  Long discussion regarding the potential adverse events on tamoxifen including but not limited to hot flashes, mood swings, thromboembolic events strokes.  90-day prescription sent to [facility].  Sister will enroll online.  # DISPOSITION: # refer to Dr.Chrystal re: postmastectomy radiation # follow up in 2 months- MD; no labs-  Dr.B

## 2022-07-04 ENCOUNTER — Inpatient Hospital Stay: Payer: Medicare Other | Attending: Oncology

## 2022-07-04 ENCOUNTER — Ambulatory Visit
Admission: RE | Admit: 2022-07-04 | Discharge: 2022-07-04 | Disposition: A | Discharge status: Home / Self Care | Payer: Medicare Other | Source: Ambulatory Visit | Attending: Radiation Oncology | Admitting: Radiation Oncology

## 2022-07-04 ENCOUNTER — Ambulatory Visit: Payer: Medicare Other

## 2022-07-04 ENCOUNTER — Other Ambulatory Visit: Payer: Self-pay

## 2022-07-04 DIAGNOSIS — C50822 Malignant neoplasm of overlapping sites of left male breast: Secondary | ICD-10-CM | POA: Insufficient documentation

## 2022-07-04 DIAGNOSIS — Z510 Encounter for antineoplastic radiation therapy: Secondary | ICD-10-CM | POA: Diagnosis present

## 2022-07-04 DIAGNOSIS — Z170 Estrogen receptor positive status [ER+]: Secondary | ICD-10-CM

## 2022-07-04 LAB — RAD ONC ARIA SESSION SUMMARY
Course Elapsed Days: 7
Plan Fractions Treated to Date: 6
Plan Prescribed Dose Per Fraction: 1.8 Gy
Plan Total Fractions Prescribed: 28
Plan Total Prescribed Dose: 50.4 Gy
Reference Point Dosage Given to Date: 10.8 Gy
Reference Point Session Dosage Given: 1.8 Gy
Session Number: 6

## 2022-07-04 LAB — CBC
HCT: 47.5 % (ref 39.0–52.0)
Hemoglobin: 15.2 g/dL (ref 13.0–17.0)
MCH: 29.2 pg (ref 26.0–34.0)
MCHC: 32 g/dL (ref 30.0–36.0)
MCV: 91.3 fL (ref 80.0–100.0)
Platelets: 202 10*3/uL (ref 150–400)
RBC: 5.2 MIL/uL (ref 4.22–5.81)
RDW: 13.3 % (ref 11.5–15.5)
WBC: 9.8 10*3/uL (ref 4.0–10.5)
nRBC: 0 % (ref 0.0–0.2)

## 2022-07-05 ENCOUNTER — Ambulatory Visit
Admission: RE | Admit: 2022-07-05 | Discharge: 2022-07-05 | Disposition: A | Discharge status: Home / Self Care | Payer: Medicare Other | Source: Ambulatory Visit | Attending: Radiation Oncology | Admitting: Radiation Oncology

## 2022-07-05 ENCOUNTER — Other Ambulatory Visit: Payer: Self-pay

## 2022-07-05 ENCOUNTER — Ambulatory Visit: Payer: Medicare Other

## 2022-07-05 DIAGNOSIS — C50822 Malignant neoplasm of overlapping sites of left male breast: Secondary | ICD-10-CM | POA: Insufficient documentation

## 2022-07-05 DIAGNOSIS — Z510 Encounter for antineoplastic radiation therapy: Secondary | ICD-10-CM | POA: Diagnosis not present

## 2022-07-05 LAB — RAD ONC ARIA SESSION SUMMARY
Course Elapsed Days: 8
Plan Fractions Treated to Date: 7
Plan Prescribed Dose Per Fraction: 1.8 Gy
Plan Total Fractions Prescribed: 28
Plan Total Prescribed Dose: 50.4 Gy
Reference Point Dosage Given to Date: 12.6 Gy
Reference Point Session Dosage Given: 1.8 Gy
Session Number: 7

## 2022-07-06 ENCOUNTER — Ambulatory Visit
Admission: RE | Admit: 2022-07-06 | Discharge: 2022-07-06 | Disposition: A | Discharge status: Home / Self Care | Payer: Medicare Other | Source: Ambulatory Visit | Attending: Radiation Oncology | Admitting: Radiation Oncology

## 2022-07-06 ENCOUNTER — Ambulatory Visit: Payer: Medicare Other

## 2022-07-06 ENCOUNTER — Other Ambulatory Visit: Payer: Self-pay

## 2022-07-06 DIAGNOSIS — Z510 Encounter for antineoplastic radiation therapy: Secondary | ICD-10-CM | POA: Diagnosis not present

## 2022-07-06 LAB — RAD ONC ARIA SESSION SUMMARY
Course Elapsed Days: 9
Plan Fractions Treated to Date: 8
Plan Prescribed Dose Per Fraction: 1.8 Gy
Plan Total Fractions Prescribed: 28
Plan Total Prescribed Dose: 50.4 Gy
Reference Point Dosage Given to Date: 14.4 Gy
Reference Point Session Dosage Given: 1.8 Gy
Session Number: 8

## 2022-07-07 ENCOUNTER — Other Ambulatory Visit: Payer: Self-pay

## 2022-07-07 ENCOUNTER — Ambulatory Visit: Payer: Medicare Other

## 2022-07-07 ENCOUNTER — Ambulatory Visit
Admission: RE | Admit: 2022-07-07 | Discharge: 2022-07-07 | Disposition: A | Discharge status: Home / Self Care | Payer: Medicare Other | Source: Ambulatory Visit | Attending: Radiation Oncology | Admitting: Radiation Oncology

## 2022-07-07 DIAGNOSIS — Z510 Encounter for antineoplastic radiation therapy: Secondary | ICD-10-CM | POA: Diagnosis not present

## 2022-07-07 LAB — RAD ONC ARIA SESSION SUMMARY
Course Elapsed Days: 10
Plan Fractions Treated to Date: 9
Plan Prescribed Dose Per Fraction: 1.8 Gy
Plan Total Fractions Prescribed: 28
Plan Total Prescribed Dose: 50.4 Gy
Reference Point Dosage Given to Date: 16.2 Gy
Reference Point Session Dosage Given: 1.8 Gy
Session Number: 9

## 2022-07-10 ENCOUNTER — Other Ambulatory Visit: Payer: Self-pay

## 2022-07-10 ENCOUNTER — Ambulatory Visit
Admission: RE | Admit: 2022-07-10 | Discharge: 2022-07-10 | Disposition: A | Discharge status: Home / Self Care | Payer: Medicare Other | Source: Ambulatory Visit | Attending: Radiation Oncology | Admitting: Radiation Oncology

## 2022-07-10 ENCOUNTER — Ambulatory Visit: Payer: Medicare Other

## 2022-07-10 DIAGNOSIS — Z510 Encounter for antineoplastic radiation therapy: Secondary | ICD-10-CM | POA: Diagnosis not present

## 2022-07-10 LAB — RAD ONC ARIA SESSION SUMMARY
Course Elapsed Days: 13
Plan Fractions Treated to Date: 10
Plan Prescribed Dose Per Fraction: 1.8 Gy
Plan Total Fractions Prescribed: 28
Plan Total Prescribed Dose: 50.4 Gy
Reference Point Dosage Given to Date: 18 Gy
Reference Point Session Dosage Given: 1.8 Gy
Session Number: 10

## 2022-07-11 ENCOUNTER — Ambulatory Visit: Payer: Medicare Other

## 2022-07-11 ENCOUNTER — Ambulatory Visit
Admission: RE | Admit: 2022-07-11 | Discharge: 2022-07-11 | Disposition: A | Discharge status: Home / Self Care | Payer: Medicare Other | Source: Ambulatory Visit | Attending: Radiation Oncology | Admitting: Radiation Oncology

## 2022-07-11 ENCOUNTER — Other Ambulatory Visit: Payer: Self-pay

## 2022-07-11 DIAGNOSIS — Z510 Encounter for antineoplastic radiation therapy: Secondary | ICD-10-CM | POA: Diagnosis not present

## 2022-07-11 LAB — RAD ONC ARIA SESSION SUMMARY
Course Elapsed Days: 14
Plan Fractions Treated to Date: 11
Plan Prescribed Dose Per Fraction: 1.8 Gy
Plan Total Fractions Prescribed: 28
Plan Total Prescribed Dose: 50.4 Gy
Reference Point Dosage Given to Date: 19.8 Gy
Reference Point Session Dosage Given: 1.8 Gy
Session Number: 11

## 2022-07-12 ENCOUNTER — Ambulatory Visit
Admission: RE | Admit: 2022-07-12 | Discharge: 2022-07-12 | Disposition: A | Discharge status: Home / Self Care | Payer: Medicare Other | Source: Ambulatory Visit | Attending: Radiation Oncology | Admitting: Radiation Oncology

## 2022-07-12 ENCOUNTER — Other Ambulatory Visit: Payer: Self-pay

## 2022-07-12 ENCOUNTER — Ambulatory Visit: Payer: Medicare Other

## 2022-07-12 DIAGNOSIS — Z510 Encounter for antineoplastic radiation therapy: Secondary | ICD-10-CM | POA: Diagnosis not present

## 2022-07-12 LAB — RAD ONC ARIA SESSION SUMMARY
Course Elapsed Days: 15
Plan Fractions Treated to Date: 12
Plan Prescribed Dose Per Fraction: 1.8 Gy
Plan Total Fractions Prescribed: 28
Plan Total Prescribed Dose: 50.4 Gy
Reference Point Dosage Given to Date: 21.6 Gy
Reference Point Session Dosage Given: 1.8 Gy
Session Number: 12

## 2022-07-13 ENCOUNTER — Ambulatory Visit
Admission: RE | Admit: 2022-07-13 | Discharge: 2022-07-13 | Disposition: A | Discharge status: Home / Self Care | Payer: Medicare Other | Source: Ambulatory Visit | Attending: Radiation Oncology | Admitting: Radiation Oncology

## 2022-07-13 ENCOUNTER — Other Ambulatory Visit: Payer: Self-pay

## 2022-07-13 ENCOUNTER — Ambulatory Visit: Payer: Medicare Other

## 2022-07-13 DIAGNOSIS — Z510 Encounter for antineoplastic radiation therapy: Secondary | ICD-10-CM | POA: Diagnosis not present

## 2022-07-13 LAB — RAD ONC ARIA SESSION SUMMARY
Course Elapsed Days: 16
Plan Fractions Treated to Date: 13
Plan Prescribed Dose Per Fraction: 1.8 Gy
Plan Total Fractions Prescribed: 28
Plan Total Prescribed Dose: 50.4 Gy
Reference Point Dosage Given to Date: 23.4 Gy
Reference Point Session Dosage Given: 1.8 Gy
Session Number: 13

## 2022-07-14 ENCOUNTER — Other Ambulatory Visit: Payer: Self-pay

## 2022-07-14 ENCOUNTER — Ambulatory Visit
Admission: RE | Admit: 2022-07-14 | Discharge: 2022-07-14 | Disposition: A | Discharge status: Home / Self Care | Payer: Medicare Other | Source: Ambulatory Visit | Attending: Radiation Oncology | Admitting: Radiation Oncology

## 2022-07-14 ENCOUNTER — Ambulatory Visit: Payer: Medicare Other

## 2022-07-14 DIAGNOSIS — Z510 Encounter for antineoplastic radiation therapy: Secondary | ICD-10-CM | POA: Diagnosis not present

## 2022-07-14 LAB — RAD ONC ARIA SESSION SUMMARY
Course Elapsed Days: 17
Plan Fractions Treated to Date: 14
Plan Prescribed Dose Per Fraction: 1.8 Gy
Plan Total Fractions Prescribed: 28
Plan Total Prescribed Dose: 50.4 Gy
Reference Point Dosage Given to Date: 25.2 Gy
Reference Point Session Dosage Given: 1.8 Gy
Session Number: 14

## 2022-07-15 ENCOUNTER — Other Ambulatory Visit: Payer: Self-pay | Admitting: Family Medicine

## 2022-07-15 DIAGNOSIS — R0609 Other forms of dyspnea: Secondary | ICD-10-CM

## 2022-07-17 ENCOUNTER — Ambulatory Visit: Payer: Medicare Other

## 2022-07-17 ENCOUNTER — Ambulatory Visit
Admission: RE | Admit: 2022-07-17 | Discharge: 2022-07-17 | Disposition: A | Discharge status: Home / Self Care | Payer: Medicare Other | Source: Ambulatory Visit | Attending: Radiation Oncology | Admitting: Radiation Oncology

## 2022-07-17 ENCOUNTER — Other Ambulatory Visit: Payer: Medicare Other

## 2022-07-17 ENCOUNTER — Other Ambulatory Visit: Payer: Self-pay

## 2022-07-17 DIAGNOSIS — Z510 Encounter for antineoplastic radiation therapy: Secondary | ICD-10-CM | POA: Diagnosis not present

## 2022-07-17 LAB — RAD ONC ARIA SESSION SUMMARY
Course Elapsed Days: 20
Plan Fractions Treated to Date: 15
Plan Prescribed Dose Per Fraction: 1.8 Gy
Plan Total Fractions Prescribed: 28
Plan Total Prescribed Dose: 50.4 Gy
Reference Point Dosage Given to Date: 27 Gy
Reference Point Session Dosage Given: 1.8 Gy
Session Number: 15

## 2022-07-18 ENCOUNTER — Other Ambulatory Visit: Payer: Self-pay

## 2022-07-18 ENCOUNTER — Other Ambulatory Visit (INDEPENDENT_AMBULATORY_CARE_PROVIDER_SITE_OTHER): Payer: Medicare Other

## 2022-07-18 ENCOUNTER — Ambulatory Visit: Payer: Medicare Other

## 2022-07-18 ENCOUNTER — Inpatient Hospital Stay: Payer: Medicare Other

## 2022-07-18 ENCOUNTER — Ambulatory Visit
Admission: RE | Admit: 2022-07-18 | Discharge: 2022-07-18 | Disposition: A | Discharge status: Home / Self Care | Payer: Medicare Other | Source: Ambulatory Visit | Attending: Radiation Oncology | Admitting: Radiation Oncology

## 2022-07-18 DIAGNOSIS — E785 Hyperlipidemia, unspecified: Secondary | ICD-10-CM

## 2022-07-18 DIAGNOSIS — Z510 Encounter for antineoplastic radiation therapy: Secondary | ICD-10-CM | POA: Diagnosis not present

## 2022-07-18 LAB — RAD ONC ARIA SESSION SUMMARY
Course Elapsed Days: 21
Plan Fractions Treated to Date: 16
Plan Prescribed Dose Per Fraction: 1.8 Gy
Plan Total Fractions Prescribed: 28
Plan Total Prescribed Dose: 50.4 Gy
Reference Point Dosage Given to Date: 28.8 Gy
Reference Point Session Dosage Given: 1.8 Gy
Session Number: 16

## 2022-07-18 LAB — LDL CHOLESTEROL, DIRECT: Direct LDL: 101 mg/dL

## 2022-07-19 ENCOUNTER — Other Ambulatory Visit: Payer: Self-pay

## 2022-07-19 ENCOUNTER — Ambulatory Visit: Payer: Medicare Other

## 2022-07-19 ENCOUNTER — Ambulatory Visit
Admission: RE | Admit: 2022-07-19 | Discharge: 2022-07-19 | Disposition: A | Discharge status: Home / Self Care | Payer: Medicare Other | Source: Ambulatory Visit | Attending: Radiation Oncology | Admitting: Radiation Oncology

## 2022-07-19 DIAGNOSIS — Z510 Encounter for antineoplastic radiation therapy: Secondary | ICD-10-CM | POA: Diagnosis not present

## 2022-07-19 LAB — RAD ONC ARIA SESSION SUMMARY
Course Elapsed Days: 22
Plan Fractions Treated to Date: 17
Plan Prescribed Dose Per Fraction: 1.8 Gy
Plan Total Fractions Prescribed: 28
Plan Total Prescribed Dose: 50.4 Gy
Reference Point Dosage Given to Date: 30.6 Gy
Reference Point Session Dosage Given: 1.8 Gy
Session Number: 17

## 2022-07-20 ENCOUNTER — Ambulatory Visit: Payer: Medicare Other

## 2022-07-20 ENCOUNTER — Other Ambulatory Visit: Payer: Self-pay

## 2022-07-20 ENCOUNTER — Ambulatory Visit
Admission: RE | Admit: 2022-07-20 | Discharge: 2022-07-20 | Disposition: A | Discharge status: Home / Self Care | Payer: Medicare Other | Source: Ambulatory Visit | Attending: Radiation Oncology | Admitting: Radiation Oncology

## 2022-07-20 DIAGNOSIS — Z510 Encounter for antineoplastic radiation therapy: Secondary | ICD-10-CM | POA: Diagnosis not present

## 2022-07-20 LAB — RAD ONC ARIA SESSION SUMMARY
Course Elapsed Days: 23
Plan Fractions Treated to Date: 18
Plan Prescribed Dose Per Fraction: 1.8 Gy
Plan Total Fractions Prescribed: 28
Plan Total Prescribed Dose: 50.4 Gy
Reference Point Dosage Given to Date: 32.4 Gy
Reference Point Session Dosage Given: 1.8 Gy
Session Number: 18

## 2022-07-21 ENCOUNTER — Ambulatory Visit
Admission: RE | Admit: 2022-07-21 | Discharge: 2022-07-21 | Disposition: A | Discharge status: Home / Self Care | Payer: Medicare Other | Source: Ambulatory Visit | Attending: Radiation Oncology | Admitting: Radiation Oncology

## 2022-07-21 ENCOUNTER — Ambulatory Visit: Payer: Medicare Other

## 2022-07-21 ENCOUNTER — Other Ambulatory Visit: Payer: Self-pay

## 2022-07-21 DIAGNOSIS — Z510 Encounter for antineoplastic radiation therapy: Secondary | ICD-10-CM | POA: Diagnosis not present

## 2022-07-21 LAB — RAD ONC ARIA SESSION SUMMARY
Course Elapsed Days: 24
Plan Fractions Treated to Date: 19
Plan Prescribed Dose Per Fraction: 1.8 Gy
Plan Total Fractions Prescribed: 28
Plan Total Prescribed Dose: 50.4 Gy
Reference Point Dosage Given to Date: 34.2 Gy
Reference Point Session Dosage Given: 1.8 Gy
Session Number: 19

## 2022-07-24 ENCOUNTER — Ambulatory Visit: Payer: Medicare Other

## 2022-07-24 ENCOUNTER — Other Ambulatory Visit: Payer: Self-pay

## 2022-07-24 ENCOUNTER — Ambulatory Visit
Admission: RE | Admit: 2022-07-24 | Discharge: 2022-07-24 | Disposition: A | Discharge status: Home / Self Care | Payer: Medicare Other | Source: Ambulatory Visit | Attending: Radiation Oncology | Admitting: Radiation Oncology

## 2022-07-24 DIAGNOSIS — Z510 Encounter for antineoplastic radiation therapy: Secondary | ICD-10-CM | POA: Diagnosis not present

## 2022-07-24 LAB — RAD ONC ARIA SESSION SUMMARY
Course Elapsed Days: 27
Plan Fractions Treated to Date: 20
Plan Prescribed Dose Per Fraction: 1.8 Gy
Plan Total Fractions Prescribed: 28
Plan Total Prescribed Dose: 50.4 Gy
Reference Point Dosage Given to Date: 36 Gy
Reference Point Session Dosage Given: 1.8 Gy
Session Number: 20

## 2022-07-25 ENCOUNTER — Ambulatory Visit: Payer: Medicare Other

## 2022-07-25 ENCOUNTER — Ambulatory Visit
Admission: RE | Admit: 2022-07-25 | Discharge: 2022-07-25 | Disposition: A | Discharge status: Home / Self Care | Payer: Medicare Other | Source: Ambulatory Visit | Attending: Radiation Oncology | Admitting: Radiation Oncology

## 2022-07-25 ENCOUNTER — Other Ambulatory Visit: Payer: Self-pay

## 2022-07-25 DIAGNOSIS — Z510 Encounter for antineoplastic radiation therapy: Secondary | ICD-10-CM | POA: Diagnosis not present

## 2022-07-25 LAB — RAD ONC ARIA SESSION SUMMARY
Course Elapsed Days: 28
Plan Fractions Treated to Date: 21
Plan Prescribed Dose Per Fraction: 1.8 Gy
Plan Total Fractions Prescribed: 28
Plan Total Prescribed Dose: 50.4 Gy
Reference Point Dosage Given to Date: 37.8 Gy
Reference Point Session Dosage Given: 1.8 Gy
Session Number: 21

## 2022-07-26 ENCOUNTER — Other Ambulatory Visit: Payer: Self-pay

## 2022-07-26 ENCOUNTER — Ambulatory Visit: Payer: Medicare Other

## 2022-07-26 ENCOUNTER — Ambulatory Visit
Admission: RE | Admit: 2022-07-26 | Discharge: 2022-07-26 | Disposition: A | Discharge status: Home / Self Care | Payer: Medicare Other | Source: Ambulatory Visit | Attending: Radiation Oncology | Admitting: Radiation Oncology

## 2022-07-26 DIAGNOSIS — Z510 Encounter for antineoplastic radiation therapy: Secondary | ICD-10-CM | POA: Diagnosis not present

## 2022-07-26 LAB — RAD ONC ARIA SESSION SUMMARY
Course Elapsed Days: 29
Plan Fractions Treated to Date: 22
Plan Prescribed Dose Per Fraction: 1.8 Gy
Plan Total Fractions Prescribed: 28
Plan Total Prescribed Dose: 50.4 Gy
Reference Point Dosage Given to Date: 39.6 Gy
Reference Point Session Dosage Given: 1.8 Gy
Session Number: 22

## 2022-07-31 ENCOUNTER — Ambulatory Visit: Payer: Medicare Other

## 2022-07-31 DIAGNOSIS — Z510 Encounter for antineoplastic radiation therapy: Secondary | ICD-10-CM | POA: Diagnosis not present

## 2022-08-01 ENCOUNTER — Other Ambulatory Visit: Payer: Self-pay

## 2022-08-01 ENCOUNTER — Ambulatory Visit
Admission: RE | Admit: 2022-08-01 | Discharge: 2022-08-01 | Disposition: A | Discharge status: Home / Self Care | Payer: Medicare Other | Source: Ambulatory Visit | Attending: Radiation Oncology | Admitting: Radiation Oncology

## 2022-08-01 ENCOUNTER — Inpatient Hospital Stay: Payer: Medicare Other | Attending: Oncology

## 2022-08-01 DIAGNOSIS — Z170 Estrogen receptor positive status [ER+]: Secondary | ICD-10-CM | POA: Diagnosis not present

## 2022-08-01 DIAGNOSIS — C50822 Malignant neoplasm of overlapping sites of left male breast: Secondary | ICD-10-CM | POA: Insufficient documentation

## 2022-08-01 DIAGNOSIS — Z510 Encounter for antineoplastic radiation therapy: Secondary | ICD-10-CM | POA: Diagnosis present

## 2022-08-01 LAB — RAD ONC ARIA SESSION SUMMARY
Course Elapsed Days: 35
Plan Fractions Treated to Date: 23
Plan Prescribed Dose Per Fraction: 1.8 Gy
Plan Total Fractions Prescribed: 28
Plan Total Prescribed Dose: 50.4 Gy
Reference Point Dosage Given to Date: 41.4 Gy
Reference Point Session Dosage Given: 1.8 Gy
Session Number: 23

## 2022-08-01 LAB — CBC
HCT: 47.6 % (ref 39.0–52.0)
Hemoglobin: 15.1 g/dL (ref 13.0–17.0)
MCH: 29.3 pg (ref 26.0–34.0)
MCHC: 31.7 g/dL (ref 30.0–36.0)
MCV: 92.4 fL (ref 80.0–100.0)
Platelets: 166 10*3/uL (ref 150–400)
RBC: 5.15 MIL/uL (ref 4.22–5.81)
RDW: 14.3 % (ref 11.5–15.5)
WBC: 7.9 10*3/uL (ref 4.0–10.5)
nRBC: 0 % (ref 0.0–0.2)

## 2022-08-02 ENCOUNTER — Other Ambulatory Visit: Payer: Self-pay

## 2022-08-02 ENCOUNTER — Ambulatory Visit
Admission: RE | Admit: 2022-08-02 | Discharge: 2022-08-02 | Disposition: A | Discharge status: Home / Self Care | Payer: Medicare Other | Source: Ambulatory Visit | Attending: Radiation Oncology | Admitting: Radiation Oncology

## 2022-08-02 DIAGNOSIS — Z510 Encounter for antineoplastic radiation therapy: Secondary | ICD-10-CM | POA: Diagnosis not present

## 2022-08-02 LAB — RAD ONC ARIA SESSION SUMMARY
Course Elapsed Days: 36
Plan Fractions Treated to Date: 24
Plan Prescribed Dose Per Fraction: 1.8 Gy
Plan Total Fractions Prescribed: 28
Plan Total Prescribed Dose: 50.4 Gy
Reference Point Dosage Given to Date: 43.2 Gy
Reference Point Session Dosage Given: 1.8 Gy
Session Number: 24

## 2022-08-03 ENCOUNTER — Ambulatory Visit
Admission: RE | Admit: 2022-08-03 | Discharge: 2022-08-03 | Disposition: A | Discharge status: Home / Self Care | Payer: Medicare Other | Source: Ambulatory Visit | Attending: Radiation Oncology | Admitting: Radiation Oncology

## 2022-08-03 ENCOUNTER — Other Ambulatory Visit: Payer: Self-pay

## 2022-08-03 DIAGNOSIS — Z510 Encounter for antineoplastic radiation therapy: Secondary | ICD-10-CM | POA: Diagnosis not present

## 2022-08-03 LAB — RAD ONC ARIA SESSION SUMMARY
Course Elapsed Days: 37
Plan Fractions Treated to Date: 25
Plan Prescribed Dose Per Fraction: 1.8 Gy
Plan Total Fractions Prescribed: 28
Plan Total Prescribed Dose: 50.4 Gy
Reference Point Dosage Given to Date: 45 Gy
Reference Point Session Dosage Given: 1.8 Gy
Session Number: 25

## 2022-08-04 ENCOUNTER — Other Ambulatory Visit: Payer: Self-pay

## 2022-08-04 ENCOUNTER — Ambulatory Visit
Admission: RE | Admit: 2022-08-04 | Discharge: 2022-08-04 | Disposition: A | Discharge status: Home / Self Care | Payer: Medicare Other | Source: Ambulatory Visit | Attending: Radiation Oncology | Admitting: Radiation Oncology

## 2022-08-04 DIAGNOSIS — C50822 Malignant neoplasm of overlapping sites of left male breast: Secondary | ICD-10-CM | POA: Diagnosis not present

## 2022-08-04 DIAGNOSIS — Z510 Encounter for antineoplastic radiation therapy: Secondary | ICD-10-CM | POA: Insufficient documentation

## 2022-08-04 LAB — RAD ONC ARIA SESSION SUMMARY
Course Elapsed Days: 38
Plan Fractions Treated to Date: 26
Plan Prescribed Dose Per Fraction: 1.8 Gy
Plan Total Fractions Prescribed: 28
Plan Total Prescribed Dose: 50.4 Gy
Reference Point Dosage Given to Date: 46.8 Gy
Reference Point Session Dosage Given: 1.8 Gy
Session Number: 26

## 2022-08-07 ENCOUNTER — Ambulatory Visit
Admission: RE | Admit: 2022-08-07 | Discharge: 2022-08-07 | Disposition: A | Discharge status: Home / Self Care | Payer: Medicare Other | Source: Ambulatory Visit | Attending: Radiation Oncology | Admitting: Radiation Oncology

## 2022-08-07 ENCOUNTER — Other Ambulatory Visit: Payer: Self-pay

## 2022-08-07 DIAGNOSIS — Z510 Encounter for antineoplastic radiation therapy: Secondary | ICD-10-CM | POA: Diagnosis not present

## 2022-08-07 LAB — RAD ONC ARIA SESSION SUMMARY
Course Elapsed Days: 41
Plan Fractions Treated to Date: 27
Plan Prescribed Dose Per Fraction: 1.8 Gy
Plan Total Fractions Prescribed: 28
Plan Total Prescribed Dose: 50.4 Gy
Reference Point Dosage Given to Date: 48.6 Gy
Reference Point Session Dosage Given: 1.8 Gy
Session Number: 27

## 2022-08-08 ENCOUNTER — Ambulatory Visit: Payer: Medicare Other

## 2022-08-08 ENCOUNTER — Ambulatory Visit
Admission: RE | Admit: 2022-08-08 | Discharge: 2022-08-08 | Disposition: A | Discharge status: Home / Self Care | Payer: Medicare Other | Source: Ambulatory Visit | Attending: Radiation Oncology | Admitting: Radiation Oncology

## 2022-08-08 ENCOUNTER — Other Ambulatory Visit: Payer: Self-pay

## 2022-08-08 DIAGNOSIS — Z510 Encounter for antineoplastic radiation therapy: Secondary | ICD-10-CM | POA: Diagnosis not present

## 2022-08-08 LAB — RAD ONC ARIA SESSION SUMMARY
Course Elapsed Days: 42
Plan Fractions Treated to Date: 28
Plan Prescribed Dose Per Fraction: 1.8 Gy
Plan Total Fractions Prescribed: 28
Plan Total Prescribed Dose: 50.4 Gy
Reference Point Dosage Given to Date: 50.4 Gy
Reference Point Session Dosage Given: 1.8 Gy
Session Number: 28

## 2022-08-09 ENCOUNTER — Ambulatory Visit: Payer: Medicare Other

## 2022-08-09 ENCOUNTER — Ambulatory Visit
Admission: RE | Admit: 2022-08-09 | Discharge: 2022-08-09 | Disposition: A | Discharge status: Home / Self Care | Payer: Medicare Other | Source: Ambulatory Visit | Attending: Radiation Oncology | Admitting: Radiation Oncology

## 2022-08-09 ENCOUNTER — Other Ambulatory Visit: Payer: Self-pay

## 2022-08-09 DIAGNOSIS — Z510 Encounter for antineoplastic radiation therapy: Secondary | ICD-10-CM | POA: Diagnosis not present

## 2022-08-09 LAB — RAD ONC ARIA SESSION SUMMARY
Course Elapsed Days: 43
Plan Fractions Treated to Date: 1
Plan Prescribed Dose Per Fraction: 2 Gy
Plan Total Fractions Prescribed: 5
Plan Total Prescribed Dose: 10 Gy
Reference Point Dosage Given to Date: 2 Gy
Reference Point Session Dosage Given: 2 Gy
Session Number: 29

## 2022-08-10 ENCOUNTER — Other Ambulatory Visit: Payer: Self-pay

## 2022-08-10 ENCOUNTER — Ambulatory Visit
Admission: RE | Admit: 2022-08-10 | Discharge: 2022-08-10 | Disposition: A | Discharge status: Home / Self Care | Payer: Medicare Other | Source: Ambulatory Visit | Attending: Radiation Oncology | Admitting: Radiation Oncology

## 2022-08-10 DIAGNOSIS — Z510 Encounter for antineoplastic radiation therapy: Secondary | ICD-10-CM | POA: Diagnosis not present

## 2022-08-10 LAB — RAD ONC ARIA SESSION SUMMARY
Course Elapsed Days: 44
Plan Fractions Treated to Date: 2
Plan Prescribed Dose Per Fraction: 2 Gy
Plan Total Fractions Prescribed: 5
Plan Total Prescribed Dose: 10 Gy
Reference Point Dosage Given to Date: 4 Gy
Reference Point Session Dosage Given: 2 Gy
Session Number: 30

## 2022-08-11 ENCOUNTER — Ambulatory Visit
Admission: RE | Admit: 2022-08-11 | Discharge: 2022-08-11 | Disposition: A | Discharge status: Home / Self Care | Payer: Medicare Other | Source: Ambulatory Visit | Attending: Radiation Oncology | Admitting: Radiation Oncology

## 2022-08-11 ENCOUNTER — Other Ambulatory Visit: Payer: Self-pay

## 2022-08-11 DIAGNOSIS — Z510 Encounter for antineoplastic radiation therapy: Secondary | ICD-10-CM | POA: Diagnosis not present

## 2022-08-11 LAB — RAD ONC ARIA SESSION SUMMARY
Course Elapsed Days: 45
Plan Fractions Treated to Date: 3
Plan Prescribed Dose Per Fraction: 2 Gy
Plan Total Fractions Prescribed: 5
Plan Total Prescribed Dose: 10 Gy
Reference Point Dosage Given to Date: 6 Gy
Reference Point Session Dosage Given: 2 Gy
Session Number: 31

## 2022-08-14 ENCOUNTER — Inpatient Hospital Stay (HOSPITAL_BASED_OUTPATIENT_CLINIC_OR_DEPARTMENT_OTHER): Payer: Medicare Other | Admitting: Internal Medicine

## 2022-08-14 ENCOUNTER — Ambulatory Visit: Payer: Medicare Other

## 2022-08-14 ENCOUNTER — Other Ambulatory Visit: Payer: Self-pay

## 2022-08-14 ENCOUNTER — Encounter: Payer: Self-pay | Admitting: Internal Medicine

## 2022-08-14 ENCOUNTER — Ambulatory Visit
Admission: RE | Admit: 2022-08-14 | Discharge: 2022-08-14 | Disposition: A | Discharge status: Home / Self Care | Payer: Medicare Other | Source: Ambulatory Visit | Attending: Radiation Oncology | Admitting: Radiation Oncology

## 2022-08-14 VITALS — BP 113/53 | HR 87 | Temp 97.3°F | Ht 74.0 in | Wt 251.0 lb

## 2022-08-14 DIAGNOSIS — F17210 Nicotine dependence, cigarettes, uncomplicated: Secondary | ICD-10-CM | POA: Insufficient documentation

## 2022-08-14 DIAGNOSIS — Z170 Estrogen receptor positive status [ER+]: Secondary | ICD-10-CM | POA: Diagnosis not present

## 2022-08-14 DIAGNOSIS — C50822 Malignant neoplasm of overlapping sites of left male breast: Secondary | ICD-10-CM | POA: Insufficient documentation

## 2022-08-14 DIAGNOSIS — Z510 Encounter for antineoplastic radiation therapy: Secondary | ICD-10-CM | POA: Diagnosis not present

## 2022-08-14 DIAGNOSIS — Z79899 Other long term (current) drug therapy: Secondary | ICD-10-CM | POA: Insufficient documentation

## 2022-08-14 DIAGNOSIS — Z7984 Long term (current) use of oral hypoglycemic drugs: Secondary | ICD-10-CM | POA: Insufficient documentation

## 2022-08-14 DIAGNOSIS — Z79810 Long term (current) use of selective estrogen receptor modulators (SERMs): Secondary | ICD-10-CM | POA: Insufficient documentation

## 2022-08-14 LAB — RAD ONC ARIA SESSION SUMMARY
Course Elapsed Days: 48
Plan Fractions Treated to Date: 4
Plan Prescribed Dose Per Fraction: 2 Gy
Plan Total Fractions Prescribed: 5
Plan Total Prescribed Dose: 10 Gy
Reference Point Dosage Given to Date: 8 Gy
Reference Point Session Dosage Given: 2 Gy
Session Number: 32

## 2022-08-14 NOTE — Progress Notes (Signed)
Patient here for follow up. Patient has last Radiation appt tomorrow. He still is having issue with his itchy skin on left side of chest.
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
## 2022-08-14 NOTE — Assessment & Plan Note (Addendum)
#  JULY 2023- male breast cancer T2 [4.8cm] N1- micro met; ER strongly positive; PR positive; lymphovascular invasion grade 3.s/p mastectomy- [Dr.Byrnett] Oncotype-recurrence score of 27-DECLINED Chemo.  # Currently undergoing postmastectomy radiation- [last Fx on 12/12]. Proceed with adjuvant Tamoxifen starting Jan 1st, 2024.   # Discussed the role of anti-hormonal therapy mechanism of action.  I would recommend tamoxifen for 5-10 years.  Long discussion regarding the potential adverse events on tamoxifen including but not limited to hot flashes, mood swings, thromboembolic events strokes.  Due to the risk of stroke recommend baby aspirin 81 mg once a day.  # Risk of left UE lymphedema: discussed re: OT evaluation. Declines Maureen evaluation.   # DISPOSITION: # follow up in 2 months - MD; no labs-  Dr.B

## 2022-08-15 ENCOUNTER — Other Ambulatory Visit: Payer: Self-pay

## 2022-08-15 ENCOUNTER — Ambulatory Visit
Admission: RE | Admit: 2022-08-15 | Discharge: 2022-08-15 | Disposition: A | Discharge status: Home / Self Care | Payer: Medicare Other | Source: Ambulatory Visit | Attending: Radiation Oncology | Admitting: Radiation Oncology

## 2022-08-15 ENCOUNTER — Encounter: Payer: Self-pay | Admitting: *Deleted

## 2022-08-15 DIAGNOSIS — Z510 Encounter for antineoplastic radiation therapy: Secondary | ICD-10-CM | POA: Diagnosis not present

## 2022-08-15 LAB — RAD ONC ARIA SESSION SUMMARY
Course Elapsed Days: 49
Plan Fractions Treated to Date: 5
Plan Prescribed Dose Per Fraction: 2 Gy
Plan Total Fractions Prescribed: 5
Plan Total Prescribed Dose: 10 Gy
Reference Point Dosage Given to Date: 10 Gy
Reference Point Session Dosage Given: 2 Gy
Session Number: 33

## 2022-08-27 ENCOUNTER — Other Ambulatory Visit: Payer: Self-pay | Admitting: Family Medicine

## 2022-08-27 DIAGNOSIS — E785 Hyperlipidemia, unspecified: Secondary | ICD-10-CM

## 2022-08-27 DIAGNOSIS — I152 Hypertension secondary to endocrine disorders: Secondary | ICD-10-CM

## 2022-09-05 ENCOUNTER — Other Ambulatory Visit: Payer: Self-pay | Admitting: *Deleted

## 2022-09-20 ENCOUNTER — Encounter: Payer: Self-pay | Admitting: Family Medicine

## 2022-09-20 ENCOUNTER — Ambulatory Visit (INDEPENDENT_AMBULATORY_CARE_PROVIDER_SITE_OTHER): Payer: Medicare Other | Admitting: Family Medicine

## 2022-09-20 VITALS — BP 110/66 | HR 79 | Temp 98.6°F | Ht 74.0 in | Wt 245.2 lb

## 2022-09-20 DIAGNOSIS — R0609 Other forms of dyspnea: Secondary | ICD-10-CM

## 2022-09-20 DIAGNOSIS — R7309 Other abnormal glucose: Secondary | ICD-10-CM | POA: Diagnosis not present

## 2022-09-20 DIAGNOSIS — R443 Hallucinations, unspecified: Secondary | ICD-10-CM | POA: Insufficient documentation

## 2022-09-20 DIAGNOSIS — R0781 Pleurodynia: Secondary | ICD-10-CM

## 2022-09-20 DIAGNOSIS — E119 Type 2 diabetes mellitus without complications: Secondary | ICD-10-CM

## 2022-09-20 DIAGNOSIS — Z720 Tobacco use: Secondary | ICD-10-CM

## 2022-09-20 DIAGNOSIS — F321 Major depressive disorder, single episode, moderate: Secondary | ICD-10-CM | POA: Insufficient documentation

## 2022-09-20 LAB — POCT GLYCOSYLATED HEMOGLOBIN (HGB A1C): Hemoglobin A1C: 6.5 % — AB (ref 4.0–5.6)

## 2022-09-20 NOTE — Progress Notes (Signed)
Tommi Rumps, MD Phone: [phone]  Benjamin Armstrong is a 67 y.o. male who presents today for follow-up.  Hallucinations: Patient notes over the last month he has had intermittent issues where he is seeing stuff rolling or crawling across the floor.  It does not happen all the time.  He notes it often occurs when he is about to doze off.  No auditory hallucinations.  He notes some depression though no SI.  He notes that depression is related to lack of energy and stamina and inability to work.  No memory issues.  He is not been sleeping all that well.  Left rib pain: Patient notes 2 to 3 weeks ago he slipped going down the steps and landed on his back.  He noted for about 2 weeks his back and left anterior ribs with quite painful.  His back pain has resolved at this time.  He continues to have some left anterior rib pain that is progressively improving and is quite a bit better.  He notes no head injury or syncope with this fall.  Diabetes: Patient is not on medication.  He does not check his sugar.  COPD: Patient notes his Spiriva is beneficial when he takes this.  Continues to smoke and is not ready to quit.  Social History   Tobacco Use  Smoking Status Every Day   Packs/day: 1.00   Years: 53.00   Total pack years: 53.00   Types: Cigarettes  Smokeless Tobacco Never    Current Outpatient Medications on File Prior to Visit  Medication Sig Dispense Refill   Accu-Chek Softclix Lancets lancets Check sugar fasting and 2 hours after one meal each day 100 each 3   albuterol (VENTOLIN HFA) 108 (90 Base) MCG/ACT inhaler TAKE 2 PUFFS BY MOUTH EVERY 6 HOURS AS NEEDED FOR WHEEZE OR SHORTNESS OF BREATH 18 each 0   EPINEPHrine 0.3 mg/0.3 mL IJ SOAJ injection Inject 0.3 mg into the muscle as needed for anaphylaxis. 2 each 1   Famotidine (PEPCID AC PO) Take 1 tablet by mouth as needed.     furosemide (LASIX) 20 MG tablet Take 2 tablets (40 mg total) by mouth daily. For swelling 180 tablet 3    losartan (COZAAR) 100 MG tablet Take 1 tablet (100 mg total) by mouth daily. 90 tablet 1   naproxen sodium (ALEVE) 220 MG tablet Take 220 mg by mouth 2 (two) times daily as needed.     rosuvastatin (CRESTOR) 10 MG tablet TAKE 1 TABLET BY MOUTH EVERY DAY 90 tablet 0   sildenafil (REVATIO) 20 MG tablet 1-5 tablets as needed one hour prior to intercourse 30 tablet 2   tamoxifen (NOLVADEX) 20 MG tablet Take 1 tablet (20 mg total) by mouth daily. DO NOT START UNTIL directed by MD. 90 tablet 1   tamsulosin (FLOMAX) 0.4 MG CAPS capsule Take 1 capsule (0.4 mg total) by mouth daily. 90 capsule 3   Tiotropium Bromide Monohydrate (SPIRIVA RESPIMAT) 2.5 MCG/ACT AERS Inhale 2 puffs into the lungs daily. 4 g 0   No current facility-administered medications on file prior to visit.     ROS see history of present illness  Objective  Physical Exam Vitals:   09/20/22 1139  BP: 110/66  Pulse: 79  Temp: 98.6 F (37 C)  SpO2: 91%    BP Readings from Last 3 Encounters:  09/20/22 110/66  08/14/22 (!) 113/53  06/05/22 134/83   Wt Readings from Last 3 Encounters:  09/20/22 245 lb 3.2 oz (111.2 kg)  08/14/22 251 lb (113.9 kg)  06/05/22 249 lb (112.9 kg)    Physical Exam Constitutional:      General: He is not in acute distress.    Appearance: He is not diaphoretic.  Cardiovascular:     Rate and Rhythm: Normal rate and regular rhythm.     Heart sounds: Normal heart sounds.  Pulmonary:     Effort: Pulmonary effort is normal.     Breath sounds: Normal breath sounds.  Musculoskeletal:       Arms:  Skin:    General: Skin is warm and dry.  Neurological:     Mental Status: He is alert.      Assessment/Plan: Please see individual problem list.  Type 2 diabetes mellitus without complication, without long-term current use of insulin ([place]) Assessment & Plan: Chronic issue.  A1c is generally stable.  We will continue to monitor.  Orders: -     POCT glycosylated hemoglobin (Hb  A1C)  Elevated hemoglobin A1c  Tobacco abuse Assessment & Plan: Chronic issue.  Encouraged smoking cessation.  He will let me know when he is ready to quit.   DOE (dyspnea on exertion) Assessment & Plan: Chronic issue.  Likely COPD related.  Spiriva is beneficial.  He will continue Spiriva 2 puffs daily.  Sample given today.   Rib pain on left side Assessment & Plan: Related to fall.  Possibly with a rib fracture.  Discussed getting an x-ray though the patient deferred this given that it would not necessarily change management.  He can continue over-the-counter Tylenol.  If his pain is not resolving over the next 2 to 3 weeks he will let me know.   Hallucinations Assessment & Plan: Undetermined cause.  Unlikely to be related to his current medications that we will check with her clinical pharmacist on this.  Discussed the potential for seeing neurology or psychiatry for this issue moving forward if it is determined that his medications are not contributing.   Depression, major, single episode, moderate (HCC) Assessment & Plan: Hallucinations certainly could be related to depression.  Will get input from our pharmacist on his medications and then consider treatment for depression if he is willing.      Return in about 3 months (around 12/20/2022).   Tommi Rumps, MD [place]

## 2022-09-20 NOTE — Assessment & Plan Note (Signed)
Chronic issue.  Encouraged smoking cessation.  He will let me know when he is ready to quit.

## 2022-09-20 NOTE — Assessment & Plan Note (Signed)
Related to fall.  Possibly with a rib fracture.  Discussed getting an x-ray though the patient deferred this given that it would not necessarily change management.  He can continue over-the-counter Tylenol.  If his pain is not resolving over the next 2 to 3 weeks he will let me know.

## 2022-09-20 NOTE — Patient Instructions (Signed)
Nice to see you. I will let you know what our pharmacist says regarding her medications. If your ribs do not resolve over the next 2 to 3 weeks please let us know.

## 2022-09-20 NOTE — Assessment & Plan Note (Signed)
Hallucinations certainly could be related to depression.  Will get input from our pharmacist on his medications and then consider treatment for depression if he is willing.

## 2022-09-20 NOTE — Assessment & Plan Note (Signed)
Undetermined cause.  Unlikely to be related to his current medications that we will check with her clinical pharmacist on this.  Discussed the potential for seeing neurology or psychiatry for this issue moving forward if it is determined that his medications are not contributing.

## 2022-09-20 NOTE — Assessment & Plan Note (Signed)
Chronic issue.  Likely COPD related.  Spiriva is beneficial.  He will continue Spiriva 2 puffs daily.  Sample given today.

## 2022-09-20 NOTE — Assessment & Plan Note (Signed)
Chronic issue.  A1c is generally stable.  We will continue to monitor.

## 2022-09-21 ENCOUNTER — Other Ambulatory Visit: Payer: Self-pay | Admitting: *Deleted

## 2022-09-21 ENCOUNTER — Other Ambulatory Visit: Payer: Medicare Other

## 2022-09-21 DIAGNOSIS — N400 Enlarged prostate without lower urinary tract symptoms: Secondary | ICD-10-CM

## 2022-09-22 LAB — PSA: Prostate Specific Ag, Serum: 1.1 ng/mL (ref 0.0–4.0)

## 2022-09-25 ENCOUNTER — Ambulatory Visit
Admission: RE | Admit: 2022-09-25 | Discharge: 2022-09-25 | Disposition: A | Discharge status: Home / Self Care | Payer: Medicare Other | Source: Ambulatory Visit | Attending: Radiation Oncology | Admitting: Radiation Oncology

## 2022-09-25 ENCOUNTER — Telehealth: Payer: Self-pay | Admitting: Family Medicine

## 2022-09-25 ENCOUNTER — Encounter: Payer: Self-pay | Admitting: Radiation Oncology

## 2022-09-25 VITALS — BP 116/54 | HR 82 | Temp 98.9°F | Resp 16 | Wt 251.3 lb

## 2022-09-25 DIAGNOSIS — C50822 Malignant neoplasm of overlapping sites of left male breast: Secondary | ICD-10-CM | POA: Insufficient documentation

## 2022-09-25 DIAGNOSIS — Z923 Personal history of irradiation: Secondary | ICD-10-CM | POA: Insufficient documentation

## 2022-09-25 DIAGNOSIS — Z170 Estrogen receptor positive status [ER+]: Secondary | ICD-10-CM | POA: Insufficient documentation

## 2022-09-25 NOTE — Telephone Encounter (Signed)
-----  Message from Benjamin Armstrong, [place] sent at 09/21/2022 10:37 AM EST ----- Agree, no incidence of hallucinations with any of his medications.   Catie ----- Message ----- From: Leone Haven, MD Sent: 09/20/2022  12:31 PM EST To: Benjamin Armstrong, RPH-CPP  Hey,   This patient has been having intermittent hallucinations and he was concerned that his medications could be contributing. I told him I did not think the meds he is on would cause that, though can you take a look for me to confirm? Thanks.   Randall Hiss

## 2022-09-25 NOTE — Progress Notes (Signed)
Radiation Oncology Follow up Note  Name: Benjamin Armstrong   Date:   09/25/2022 MRN:  [id number] DOB: 31-Jan-1956    This 67 y.o. 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  REFERRING PROVIDER: Leone Haven, MD  HPI: Patient is a 66 year old male now out 1 month having completed adjuvant radiation therapy to his left chest wall for stage IIb ER positive invasive mammary carcinoma status post left modified radical mastectomy.  He did take a fall last month and his according to the patient fractured some ribs.  They are stealing slowly.  Specifically denies chest wall pain or tenderness or any swelling in his left upper extremity.  He has not yet started endocrine therapy..  COMPLICATIONS OF TREATMENT: none  FOLLOW UP COMPLIANCE: keeps appointments   PHYSICAL EXAM:  BP (!) 116/54 (BP Location: Left Arm, Patient Position: Sitting, Cuff Size: Normal)   Pulse 82   Temp 98.9 F (37.2 C) (Tympanic)   Resp 16   Wt 251 lb 4.8 oz (114 kg)   BMI 32.27 kg/m  Patient status post left modified radical mastectomy.  Scars well-healed no evidence of chest wall mass or nodularity is noted.  No axillary or supraclavicular adenopathy is appreciated.  Well-developed well-nourished patient in NAD. HEENT reveals PERLA, EOMI, discs not visualized.  Oral cavity is clear. No oral mucosal lesions are identified. Neck is clear without evidence of cervical or supraclavicular adenopathy. Lungs are clear to A&P. Cardiac examination is essentially unremarkable with regular rate and rhythm without murmur rub or thrill. Abdomen is benign with no organomegaly or masses noted. Motor sensory and DTR levels are equal and symmetric in the upper and lower extremities. Cranial nerves II through XII are grossly intact. Proprioception is intact.  No peripheral adenopathy or edema is identified. No motor or sensory levels are noted. Crude visual fields are within normal range.  RADIOLOGY RESULTS: No current films for review  PLAN: Present time patient is doing well 1 month out from left chest wall radiation.  And pleased with his overall progress.  I am setting up to see Dr. B for consideration of endocrine therapy in the next several weeks.  I have asked to see him back in 6 months for follow-up.  Patient is to call with any concerns.  I would like to take this opportunity to thank you for allowing me to participate in the care of your patient.Noreene Filbert, MD

## 2022-09-25 NOTE — Telephone Encounter (Signed)
Please let the patient know that I heard back from the pharmacist. She reviewed his medications and noted that there was no incidence of hallucinations with any of his medications. I would like to refer him to a neurologist to evaluate further to help determine an underlying cause.

## 2022-09-26 NOTE — Telephone Encounter (Signed)
Noted  

## 2022-09-26 NOTE — Telephone Encounter (Signed)
I called the patient on yesterday and he stated he wanted to hold off on the Neurologist because the hallucinations are not very frequent he stated its actually been several weeks but if they return he will call and let us know.  Zaakirah Kistner,cma

## 2022-09-27 ENCOUNTER — Ambulatory Visit: Payer: Medicare Other | Admitting: Urology

## 2022-10-04 ENCOUNTER — Ambulatory Visit: Payer: Medicare Other | Admitting: Urology

## 2022-10-05 ENCOUNTER — Telehealth: Payer: Self-pay

## 2022-10-05 DIAGNOSIS — Z596 Low income: Secondary | ICD-10-CM

## 2022-10-05 NOTE — Progress Notes (Signed)
[facility] [facility])                                            [place] Team    10/05/2022  Benjamin Armstrong 02/21/1956 [id number]   [facility])                                            [place] Team   Received patient and provider portion(s) of patient assistance application(s) for Spiriva Respimat. Faxed completed application and required documents into [place].   Lelon Huh, [place] [facility] [phone]

## 2022-10-06 ENCOUNTER — Telehealth: Payer: Self-pay

## 2022-10-06 ENCOUNTER — Other Ambulatory Visit: Payer: Self-pay

## 2022-10-06 DIAGNOSIS — R0609 Other forms of dyspnea: Secondary | ICD-10-CM

## 2022-10-06 MED ORDER — SPIRIVA RESPIMAT 2.5 MCG/ACT IN AERS: 2.0000 | INHALATION_SPRAY | Freq: Every day | RESPIRATORY_TRACT | 0 refills | Status: DC

## 2022-10-06 NOTE — Telephone Encounter (Signed)
Patient is requesting another sample of the Spiriva.  Saleha Kalp,cma

## 2022-10-06 NOTE — Telephone Encounter (Signed)
Samples of this drug Spiriva were given to the patient, quantity  1 , Lot Number [id number] Ex. 11/01/2022.

## 2022-10-09 NOTE — Telephone Encounter (Signed)
Noted and agree with giving samples.

## 2022-10-12 ENCOUNTER — Encounter: Payer: Self-pay | Admitting: Pharmacist

## 2022-10-12 NOTE — Progress Notes (Signed)
[facility])                                            [place] Team    10/12/2022  Benjamin Armstrong April 07, 1956 [id number]  Patient has submitted an application for PAP for Spiriva.  [employer] requested the patient also apply for LIS.  Application for LIS has been completed and submitted online.  Curlene Labrum, PharmD [place] Pharmacist Office: [phone]

## 2022-10-16 ENCOUNTER — Ambulatory Visit
Admission: RE | Admit: 2022-10-16 | Discharge: 2022-10-16 | Disposition: A | Discharge status: Home / Self Care | Payer: Medicare Other | Source: Ambulatory Visit | Attending: Internal Medicine | Admitting: Internal Medicine

## 2022-10-16 ENCOUNTER — Encounter: Payer: Self-pay | Admitting: Internal Medicine

## 2022-10-16 ENCOUNTER — Inpatient Hospital Stay: Payer: Medicare Other | Attending: Oncology | Admitting: Internal Medicine

## 2022-10-16 VITALS — BP 108/50 | HR 80 | Temp 97.6°F | Resp 18 | Wt 256.8 lb

## 2022-10-16 DIAGNOSIS — R079 Chest pain, unspecified: Secondary | ICD-10-CM

## 2022-10-16 DIAGNOSIS — C50822 Malignant neoplasm of overlapping sites of left male breast: Secondary | ICD-10-CM | POA: Diagnosis not present

## 2022-10-16 DIAGNOSIS — F17210 Nicotine dependence, cigarettes, uncomplicated: Secondary | ICD-10-CM | POA: Diagnosis not present

## 2022-10-16 DIAGNOSIS — I890 Lymphedema, not elsewhere classified: Secondary | ICD-10-CM | POA: Diagnosis not present

## 2022-10-16 DIAGNOSIS — I972 Postmastectomy lymphedema syndrome: Secondary | ICD-10-CM | POA: Insufficient documentation

## 2022-10-16 DIAGNOSIS — Z170 Estrogen receptor positive status [ER+]: Secondary | ICD-10-CM | POA: Diagnosis not present

## 2022-10-16 DIAGNOSIS — J449 Chronic obstructive pulmonary disease, unspecified: Secondary | ICD-10-CM | POA: Insufficient documentation

## 2022-10-16 DIAGNOSIS — Z9012 Acquired absence of left breast and nipple: Secondary | ICD-10-CM | POA: Insufficient documentation

## 2022-10-16 DIAGNOSIS — R600 Localized edema: Secondary | ICD-10-CM | POA: Insufficient documentation

## 2022-10-16 DIAGNOSIS — Z923 Personal history of irradiation: Secondary | ICD-10-CM | POA: Diagnosis not present

## 2022-10-16 DIAGNOSIS — Z79810 Long term (current) use of selective estrogen receptor modulators (SERMs): Secondary | ICD-10-CM | POA: Insufficient documentation

## 2022-10-16 NOTE — Assessment & Plan Note (Addendum)
#    JULY 2023- male breast cancer T2 [4.8cm] N1- micro met; ER strongly positive; PR positive; lymphovascular invasion grade 3. s/p mastectomy- [Dr.Byrnett] Oncotype-recurrence score of 27-DECLINED Chemo.  Currently s/p postmastectomy radiation- [last Fx on 12/12].   # Proceed with adjuvant Tamoxifen. Patient has script.  Discussed the role of anti-hormonal therapy mechanism of action.  I would recommend tamoxifen for 5-10 years.  Long discussion regarding the potential adverse events on tamoxifen including but not limited to hot flashes, mood swings, thromboembolic events strokes.  Due to the risk of stroke recommend baby aspirin 81 mg once a day.  # Left UE lymphedema: discussed re: OT evaluation.  Reluctantly agreed to evaluation.  Referral to [facility] for evaluation.   # Rib pain-right-sided question cough causing rib fractures.  Clinically not suspicious for PE.  # Cough- ? COPD/recommend quitting smoking.  Follow-up with PCP.  # Bilateral LE swelling [on lasix- Dr.Sonnebegr]; Dr.Etang-if not improved recommend reaching out to PCP/cardiology regarding management of his leg swelling.  Recommend stockings.  # DISPOSITION: # X- rays today- ordered # Referral to [facility] re: lymphedema evaluation. # follow up in 2 months - MD; cbc/cmp;   Dr.B

## 2022-10-16 NOTE — Progress Notes (Signed)
Pt has zero energy. Pt eats all the time but doesn't feel hungry or notice an appetite. Eats so he doesn't feel shaky. Taking fluid pills but still retaining fluid in hands, belly, legs and face. Feels tight in his chest from the fluid. Since radiation he has not been able to control the fluid build up. Bowels normal.He has not started Tamoxifen yet. Pt fell on ice on the steps and fractured some ribs. Has pain in his chest from that.
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
## 2022-10-18 ENCOUNTER — Telehealth: Payer: Self-pay | Admitting: *Deleted

## 2022-10-18 ENCOUNTER — Other Ambulatory Visit: Payer: Self-pay | Admitting: Internal Medicine

## 2022-10-18 DIAGNOSIS — R918 Other nonspecific abnormal finding of lung field: Secondary | ICD-10-CM

## 2022-10-18 DIAGNOSIS — Z170 Estrogen receptor positive status [ER+]: Secondary | ICD-10-CM

## 2022-10-18 NOTE — Telephone Encounter (Signed)
Informed pt of his 2 rib fractures on the right side. Also informed him of the lung nodule. He would like to go ahead and order a Chest CT. Will let Dr B know to order the scan.

## 2022-10-18 NOTE — Telephone Encounter (Signed)
Called report  IMPRESSION: 1. Subtle right posterolateral fourth and fifth nondisplaced rib fractures cannot be excluded. No pneumothorax. 2. Chronic interstitial changes noted in the lungs. Questionable tiny pulmonary nodules are present in the lungs. Given the patient's history of breast cancer nonenhanced chest CT suggested for further evaluation.   These results will be called to the ordering clinician or representative by the Radiologist Assistant, and communication documented in the PACS or [employer].     Electronically Signed   By: Marcello Moores  Register M.D.   On: 10/18/2022 08:55

## 2022-10-18 NOTE — Progress Notes (Signed)
CT scan ordered.  GB

## 2022-10-18 NOTE — Telephone Encounter (Signed)
Chest x-ray results noted; July, 2023-74m lung nodule noted.  Recommend CT scan.  Patient will be informed by staff.

## 2022-10-25 ENCOUNTER — Inpatient Hospital Stay: Payer: Medicare Other | Admitting: Occupational Therapy

## 2022-10-25 DIAGNOSIS — I890 Lymphedema, not elsewhere classified: Secondary | ICD-10-CM
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
## 2022-10-26 ENCOUNTER — Other Ambulatory Visit: Payer: Self-pay | Admitting: Nurse Practitioner

## 2022-10-26 ENCOUNTER — Ambulatory Visit
Admission: RE | Admit: 2022-10-26 | Discharge: 2022-10-26 | Disposition: A | Discharge status: Home / Self Care | Payer: Medicare Other | Source: Ambulatory Visit | Attending: Internal Medicine | Admitting: Internal Medicine

## 2022-10-26 DIAGNOSIS — Z170 Estrogen receptor positive status [ER+]: Secondary | ICD-10-CM

## 2022-10-26 DIAGNOSIS — R918 Other nonspecific abnormal finding of lung field: Secondary | ICD-10-CM

## 2022-10-27 ENCOUNTER — Other Ambulatory Visit: Payer: Medicare Other

## 2022-10-30 ENCOUNTER — Telehealth: Payer: Self-pay | Admitting: Internal Medicine

## 2022-10-30 NOTE — Telephone Encounter (Signed)
Interval progression of pulmonary metastatic disease with interval development of multiple randomly distributed pulmonary nodules within the lungs bilaterally.   Patient started tamoxifen 10/16/2022.   Discussed re: additional chemo pills- CDK-inhibitor.   # Please move up the patient's appointments to next week- MD; labs- Thanks GB

## 2022-11-07 ENCOUNTER — Encounter: Payer: Self-pay | Admitting: Urology

## 2022-11-07 ENCOUNTER — Ambulatory Visit (INDEPENDENT_AMBULATORY_CARE_PROVIDER_SITE_OTHER): Payer: Medicare Other | Admitting: Urology

## 2022-11-07 VITALS — BP 132/66 | HR 85 | Ht 74.0 in | Wt 257.0 lb

## 2022-11-07 DIAGNOSIS — N529 Male erectile dysfunction, unspecified: Secondary | ICD-10-CM

## 2022-11-07 DIAGNOSIS — R350 Frequency of micturition: Secondary | ICD-10-CM | POA: Diagnosis not present

## 2022-11-07 DIAGNOSIS — N401 Enlarged prostate with lower urinary tract symptoms: Secondary | ICD-10-CM | POA: Diagnosis not present

## 2022-11-07 DIAGNOSIS — N400 Enlarged prostate without lower urinary tract symptoms: Secondary | ICD-10-CM

## 2022-11-07 DIAGNOSIS — R3915 Urgency of urination: Secondary | ICD-10-CM | POA: Diagnosis not present

## 2022-11-07 LAB — BLADDER SCAN AMB NON-IMAGING

## 2022-11-07 MED ORDER — SILDENAFIL CITRATE 100 MG PO TABS: 100.0000 mg | ORAL_TABLET | Freq: Every day | ORAL | 11 refills | Status: DC | PRN

## 2022-11-07 MED ORDER — TAMSULOSIN HCL 0.4 MG PO CAPS: 0.4000 mg | ORAL_CAPSULE | Freq: Every day | ORAL | 3 refills | Status: DC
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
## 2022-11-08 ENCOUNTER — Inpatient Hospital Stay: Payer: Medicare Other

## 2022-11-08 ENCOUNTER — Inpatient Hospital Stay: Payer: Medicare Other | Attending: Oncology | Admitting: Occupational Therapy

## 2022-11-08 ENCOUNTER — Inpatient Hospital Stay (HOSPITAL_BASED_OUTPATIENT_CLINIC_OR_DEPARTMENT_OTHER): Payer: Medicare Other | Admitting: Internal Medicine

## 2022-11-08 ENCOUNTER — Encounter: Payer: Self-pay | Admitting: Internal Medicine

## 2022-11-08 VITALS — BP 130/60 | HR 73 | Temp 97.3°F | Resp 18 | Wt 256.4 lb

## 2022-11-08 DIAGNOSIS — I972 Postmastectomy lymphedema syndrome: Secondary | ICD-10-CM | POA: Diagnosis not present

## 2022-11-08 DIAGNOSIS — C50822 Malignant neoplasm of overlapping sites of left male breast: Secondary | ICD-10-CM

## 2022-11-08 DIAGNOSIS — Z170 Estrogen receptor positive status [ER+]: Secondary | ICD-10-CM

## 2022-11-08 DIAGNOSIS — I890 Lymphedema, not elsewhere classified: Secondary | ICD-10-CM

## 2022-11-08 DIAGNOSIS — M7989 Other specified soft tissue disorders: Secondary | ICD-10-CM | POA: Insufficient documentation

## 2022-11-08 DIAGNOSIS — J439 Emphysema, unspecified: Secondary | ICD-10-CM | POA: Insufficient documentation

## 2022-11-08 DIAGNOSIS — F17210 Nicotine dependence, cigarettes, uncomplicated: Secondary | ICD-10-CM | POA: Insufficient documentation

## 2022-11-08 DIAGNOSIS — Z923 Personal history of irradiation: Secondary | ICD-10-CM | POA: Diagnosis not present

## 2022-11-08 DIAGNOSIS — Z9012 Acquired absence of left breast and nipple: Secondary | ICD-10-CM | POA: Diagnosis not present

## 2022-11-08 DIAGNOSIS — R918 Other nonspecific abnormal finding of lung field: Secondary | ICD-10-CM | POA: Insufficient documentation

## 2022-11-08 DIAGNOSIS — Z79899 Other long term (current) drug therapy: Secondary | ICD-10-CM | POA: Insufficient documentation

## 2022-11-08 DIAGNOSIS — Z79810 Long term (current) use of selective estrogen receptor modulators (SERMs): Secondary | ICD-10-CM | POA: Insufficient documentation

## 2022-11-08 LAB — CBC WITH DIFFERENTIAL (CANCER CENTER ONLY)
Abs Immature Granulocytes: 0.04 10*3/uL (ref 0.00–0.07)
Basophils Absolute: 0.1 10*3/uL (ref 0.0–0.1)
Basophils Relative: 1 %
Eosinophils Absolute: 0.4 10*3/uL (ref 0.0–0.5)
Eosinophils Relative: 5 %
HCT: 44.4 % (ref 39.0–52.0)
Hemoglobin: 14.2 g/dL (ref 13.0–17.0)
Immature Granulocytes: 1 %
Lymphocytes Relative: 16 %
Lymphs Abs: 1.3 10*3/uL (ref 0.7–4.0)
MCH: 30 pg (ref 26.0–34.0)
MCHC: 32 g/dL (ref 30.0–36.0)
MCV: 93.9 fL (ref 80.0–100.0)
Monocytes Absolute: 0.9 10*3/uL (ref 0.1–1.0)
Monocytes Relative: 11 %
Neutro Abs: 5.5 10*3/uL (ref 1.7–7.7)
Neutrophils Relative %: 66 %
Platelet Count: 174 10*3/uL (ref 150–400)
RBC: 4.73 MIL/uL (ref 4.22–5.81)
RDW: 13.6 % (ref 11.5–15.5)
WBC Count: 8.2 10*3/uL (ref 4.0–10.5)
nRBC: 0 % (ref 0.0–0.2)

## 2022-11-08 LAB — COMPREHENSIVE METABOLIC PANEL
ALT: 18 U/L (ref 0–44)
AST: 18 U/L (ref 15–41)
Albumin: 3.3 g/dL — ABNORMAL LOW (ref 3.5–5.0)
Alkaline Phosphatase: 60 U/L (ref 38–126)
Anion gap: 10 (ref 5–15)
BUN: 23 mg/dL (ref 8–23)
CO2: 30 mmol/L (ref 22–32)
Calcium: 9 mg/dL (ref 8.9–10.3)
Chloride: 98 mmol/L (ref 98–111)
Creatinine, Ser: 1.18 mg/dL (ref 0.61–1.24)
GFR, Estimated: >60 mL/min (ref >60)
Glucose, Bld: 116 mg/dL — ABNORMAL HIGH (ref 70–99)
Potassium: 4.4 mmol/L (ref 3.5–5.1)
Sodium: 138 mmol/L (ref 135–145)
Total Bilirubin: 0.3 mg/dL (ref 0.3–1.2)
Total Protein: 7 g/dL (ref 6.5–8.1)
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
## 2022-11-08 NOTE — Progress Notes (Signed)
Ribs are slowly improving pain is better but moving a certain way or coughing can aggravate the pain. He is working with Gwenette Greet in Lymphedema clinic, he has compression socks on LE's but the glove for his R hand is too small, it hurts and causes cramping he can only tolerate it it about 45 mins.

## 2022-11-08 NOTE — Assessment & Plan Note (Addendum)
#    JULY 2023- male breast cancer T2 [4.8cm] N1- micro met; ER strongly positive; PR positive; lymphovascular invasion grade 3. s/p mastectomy- [Dr.Byrnett] Oncotype-recurrence score of 27-DECLINED Chemo.  Currently s/p postmastectomy radiation- [last Fx on 12/12]. 22nd- FEB 2024- [cxr]- CT-bilateral subcentimeter lung nodules concerning for progression of disease. Feb 2024-TAMOXIFEN.   # I reviewed the concerning findings on the CT scan suggestive of metastatic disease given history of breast cancer.  Nodules are small to consider biopsy at this time. ?  Rule out PET scan.  I discussed my concerns for stage IV cancer-incurable cancer of the lung nodules are truly from his breast cancer.   # I discussed the options of continued tamoxifen Vs. discussed the option of adding CDK inhibitor given the concern of metastatic disease [my preference]. Also discussed chemotherapy-which patient promptly declined.  However patient declined any additional therapy at this time.  He wants to follow-up CT scan in 3 months-and he would reconsider his treatment options at this time.  # Left UE lymphedema: s/p  OT evaluation-currently wearing a lymphedema sleeve.  # Rib pain-right-sided question cough causing rib fractures.  Stable.  # Cough- ? COPD/recommend quitting smoking.  Follow-up with PCP.  # Bilateral LE swelling [on lasix- Dr.Sonnebegr]; Dr.Etang-stable-continue stockings.stable.   *DRI- no contrast # DISPOSITION:  # follow up in 2nd week of JUNE 2024- - MD; cbc/cmp; CT scan prior-   Dr.B  # I reviewed the blood work- with the patient in detail; also reviewed the imaging independently [as summarized above]; and with the patient in detail.
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
## 2022-11-09 ENCOUNTER — Telehealth: Payer: Self-pay | Admitting: *Deleted

## 2022-11-09 NOTE — Telephone Encounter (Signed)
Per request from Rosalyn Gess a prescription for Unilateral Mastectomy jovipak breast pad with chest compression was faxed to [facility] with medical documentation.

## 2022-11-14 ENCOUNTER — Encounter: Payer: Self-pay | Admitting: Family Medicine

## 2022-11-14 DIAGNOSIS — J449 Chronic obstructive pulmonary disease, unspecified: Secondary | ICD-10-CM

## 2022-11-16 ENCOUNTER — Telehealth: Payer: Self-pay

## 2022-11-16 NOTE — Progress Notes (Signed)
   Care Guide Note  11/16/2022 Name: Benjamin Armstrong MRN: [id number] DOB: [birth year]  Referred by: Leone Haven, MD Reason for referral : Care Coordination (Outreach to schedule referral with Pharm d )   [name] is a 67 y.o. year old male who is a primary care patient of Caryl Bis, Angela Adam, MD. [name] was referred to the pharmacist for assistance related to COPD.    Successful contact was made with the patient to discuss pharmacy services including being ready for the pharmacist to call at least 5 minutes before the scheduled appointment time, to have medication bottles and any blood sugar or blood pressure readings ready for review. The patient agreed to meet with the pharmacist via with the pharmacist via telephone visit on (date/time).  12/13/2022  Noreene Larsson, [place],  [address] Direct Dial: [phone] [email or web address]

## 2022-11-16 NOTE — Progress Notes (Signed)
   Care Guide Note  11/16/2022 Name: Benjamin Armstrong MRN: [id number] DOB: [birth year]  Referred by: Leone Haven, MD Reason for referral : Care Coordination (Outreach to schedule referral with Pharm d )   Benjamin Armstrong is a 67 y.o. year old male who is a primary care patient of Caryl Bis, Angela Adam, MD. Benjamin Armstrong was referred to the pharmacist for assistance related to DM.    An unsuccessful telephone outreach was attempted today to contact the patient who was referred to the pharmacy team for assistance with medication management. Additional attempts will be made to contact the patient.   Noreene Larsson, [place], [place] [address] Direct Dial: [phone] [email or web address]

## 2022-11-16 NOTE — Telephone Encounter (Signed)
Medication Samples have been provided to the patient.  Drug name: Spiriva Respimat       Strength: 2.5 mcg        Qty: 1 box  LOT: [id number]  Exp.Date: 01/02/2024  Dosing instructions: Inhale 2 puffs into the lungs daily.   The patient has been instructed regarding the correct time, dose, and frequency of taking this medication, including desired effects and most common side effects.   Gracy Racer 3:48 PM 11/16/2022     Pt has been informed via mychart to come pick up sample.

## 2022-11-27 ENCOUNTER — Encounter: Payer: Self-pay | Admitting: Cardiology

## 2022-11-27 ENCOUNTER — Ambulatory Visit: Payer: Medicare Other | Attending: Cardiology | Admitting: Cardiology

## 2022-11-27 VITALS — BP 112/58 | HR 80 | Ht 70.0 in | Wt 257.8 lb

## 2022-11-27 DIAGNOSIS — F17200 Nicotine dependence, unspecified, uncomplicated: Secondary | ICD-10-CM | POA: Diagnosis present

## 2022-11-27 DIAGNOSIS — E785 Hyperlipidemia, unspecified: Secondary | ICD-10-CM | POA: Diagnosis present

## 2022-11-27 DIAGNOSIS — R600 Localized edema: Secondary | ICD-10-CM | POA: Insufficient documentation

## 2022-11-27 DIAGNOSIS — I10 Essential (primary) hypertension: Secondary | ICD-10-CM | POA: Diagnosis not present

## 2022-11-27 MED ORDER — LOSARTAN POTASSIUM 50 MG PO TABS: 50.0000 mg | ORAL_TABLET | Freq: Every day | ORAL | 0 refills | Status: DC

## 2022-11-27 MED ORDER — TORSEMIDE 40 MG PO TABS: 40.0000 mg | ORAL_TABLET | Freq: Every day | ORAL | 0 refills | Status: DC

## 2022-11-27 MED ORDER — ROSUVASTATIN CALCIUM 10 MG PO TABS: 10.0000 mg | ORAL_TABLET | Freq: Every day | ORAL | 2 refills | Status: DC

## 2022-11-27 NOTE — Patient Instructions (Signed)
Medication Instructions:   STOP Lasix START Torsemide - take one tablet (40mg ) by mouth daily.  DECREASE Losartan - take 1 tablet ( 50mg ) by mouth daily.   *If you need a refill on your cardiac medications before your next appointment, please call your pharmacy*   Lab Work:  Your physician recommends you have lab work in 10 days ( on or around April 4th)   If you have labs (blood work) drawn today and your tests are completely normal, you will receive your results only by: [employer] (if you have [place]) OR A paper copy in the mail If you have any lab test that is abnormal or we need to change your treatment, we will call you to review the results.   Testing/Procedures:  None Ordered   Follow-Up: At [facility], you and your health needs are our priority.  As part of our continuing mission to provide you with exceptional heart care, we have created designated Provider Care Teams.  These Care Teams include your primary Cardiologist (physician) and Advanced Practice Providers (APPs -  Physician Assistants and Nurse Practitioners) who all work together to provide you with the care you need, when you need it.  We recommend signing up for the patient portal called "MyChart".  Sign up information is provided on this After Visit Summary.  MyChart is used to connect with patients for Virtual Visits (Telemedicine).  Patients are able to view lab/test results, encounter notes, upcoming appointments, etc.  Non-urgent messages can be sent to your provider as well.   To learn more about what you can do with MyChart, go to [email or web address].    Your next appointment:   6 week(s)  Provider:   You may see Kate Sable, MD or one of the following Advanced Practice Providers on your designated Care Team:   Murray Hodgkins, NP Christell Faith, PA-C Cadence Kathlen Mody, PA-C Gerrie Nordmann, NP
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
## 2022-12-04 ENCOUNTER — Telehealth: Payer: Self-pay | Admitting: Family Medicine

## 2022-12-04 NOTE — Telephone Encounter (Signed)
The patient does not need any additional labs for me on the fourth.  We can plan on doing a point-of-care A1c when I see him on the 19th.

## 2022-12-04 NOTE — Telephone Encounter (Signed)
Pt sister called in staying that Dr. Charlestine Night has ordered blood work for pt, and pt would like to get those done here. Pt its booked for 4/4 @11am , however, pt will see Caryl Bis on 4/19 and was wondering if provider would like to add additional labs before he see pt? So it will be add to the ones already there.

## 2022-12-06 ENCOUNTER — Inpatient Hospital Stay: Payer: Medicare Other | Attending: Oncology | Admitting: Occupational Therapy

## 2022-12-06 DIAGNOSIS — Z79899 Other long term (current) drug therapy: Secondary | ICD-10-CM | POA: Insufficient documentation

## 2022-12-06 DIAGNOSIS — Z9012 Acquired absence of left breast and nipple: Secondary | ICD-10-CM | POA: Insufficient documentation

## 2022-12-06 DIAGNOSIS — Z170 Estrogen receptor positive status [ER+]: Secondary | ICD-10-CM | POA: Insufficient documentation

## 2022-12-06 DIAGNOSIS — C50822 Malignant neoplasm of overlapping sites of left male breast: Secondary | ICD-10-CM | POA: Insufficient documentation

## 2022-12-06 DIAGNOSIS — I890 Lymphedema, not elsewhere classified: Secondary | ICD-10-CM

## 2022-12-06 DIAGNOSIS — Z79810 Long term (current) use of selective estrogen receptor modulators (SERMs): Secondary | ICD-10-CM | POA: Insufficient documentation

## 2022-12-06 DIAGNOSIS — F17210 Nicotine dependence, cigarettes, uncomplicated: Secondary | ICD-10-CM | POA: Insufficient documentation
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
## 2022-12-07 ENCOUNTER — Encounter: Payer: Self-pay | Admitting: *Deleted

## 2022-12-07 ENCOUNTER — Other Ambulatory Visit: Payer: Medicare Other

## 2022-12-07 ENCOUNTER — Inpatient Hospital Stay: Payer: Medicare Other

## 2022-12-07 DIAGNOSIS — R600 Localized edema: Secondary | ICD-10-CM

## 2022-12-07 DIAGNOSIS — F17210 Nicotine dependence, cigarettes, uncomplicated: Secondary | ICD-10-CM | POA: Diagnosis not present

## 2022-12-07 DIAGNOSIS — I10 Essential (primary) hypertension: Secondary | ICD-10-CM

## 2022-12-07 DIAGNOSIS — C50822 Malignant neoplasm of overlapping sites of left male breast: Secondary | ICD-10-CM | POA: Diagnosis present

## 2022-12-07 DIAGNOSIS — Z79810 Long term (current) use of selective estrogen receptor modulators (SERMs): Secondary | ICD-10-CM | POA: Diagnosis not present

## 2022-12-07 DIAGNOSIS — Z170 Estrogen receptor positive status [ER+]: Secondary | ICD-10-CM | POA: Diagnosis not present

## 2022-12-07 DIAGNOSIS — Z9012 Acquired absence of left breast and nipple: Secondary | ICD-10-CM | POA: Diagnosis not present

## 2022-12-07 DIAGNOSIS — Z79899 Other long term (current) drug therapy: Secondary | ICD-10-CM | POA: Diagnosis not present

## 2022-12-07 LAB — BASIC METABOLIC PANEL
Anion gap: 8 (ref 5–15)
BUN: 24 mg/dL — ABNORMAL HIGH (ref 8–23)
CO2: 31 mmol/L (ref 22–32)
Calcium: 8.8 mg/dL — ABNORMAL LOW (ref 8.9–10.3)
Chloride: 98 mmol/L (ref 98–111)
Creatinine, Ser: 1.23 mg/dL (ref 0.61–1.24)
GFR, Estimated: >60 mL/min (ref >60)
Glucose, Bld: 147 mg/dL — ABNORMAL HIGH (ref 70–99)
Potassium: 3.3 mmol/L — ABNORMAL LOW (ref 3.5–5.1)
Sodium: 137 mmol/L (ref 135–145)

## 2022-12-08 ENCOUNTER — Other Ambulatory Visit: Payer: Self-pay

## 2022-12-08 DIAGNOSIS — Z79899 Other long term (current) drug therapy: Secondary | ICD-10-CM

## 2022-12-08 MED ORDER — POTASSIUM CHLORIDE CRYS ER 20 MEQ PO TBCR: 20.0000 meq | EXTENDED_RELEASE_TABLET | Freq: Every day | ORAL | 11 refills | Status: DC

## 2022-12-13 ENCOUNTER — Other Ambulatory Visit: Payer: Medicare Other | Admitting: Pharmacist

## 2022-12-13 ENCOUNTER — Other Ambulatory Visit: Payer: Self-pay | Admitting: *Deleted

## 2022-12-13 DIAGNOSIS — Z170 Estrogen receptor positive status [ER+]: Secondary | ICD-10-CM

## 2022-12-13 NOTE — Patient Instructions (Addendum)
Mr. Chappell, [place] the Social Security Administration office to request another copy of your Medicare Low Income Subsidy/Extra Help denial letter. If they cannot provide it, please complete the application again. You can complete it online here:   [email or web address]  Call me after you have talked to [facility]. We will need proof of denial to submit to the Spiriva manufacturer assistance program to obtain Spiriva for free.   Check your blood pressure twice daily, and any time you have concerning symptoms like headache, chest pain, dizziness, shortness of breath, or vision changes.   Our goal is less than 140/90.  To appropriately check your blood pressure, make sure you do the following:  1) Avoid caffeine, exercise, or tobacco products for 30 minutes before checking. Empty your bladder. 2) Sit with your back supported in a flat-backed chair. Rest your arm on something flat (arm of the chair, table, etc). 3) Sit still with your feet flat on the floor, resting, for at least 5 minutes.  4) Check your blood pressure. Take 1-2 readings.  5) Write down these readings and bring with you to any provider appointments.  Bring your home blood pressure machine with you to a provider's office for accuracy comparison at least once a year.   Make sure you take your blood pressure medications before you come to any office visit, even if you were asked to fast for labs.   Take care!  Catie Eppie Gibson, PharmD, BCACP, CPP [facility] [phone]

## 2022-12-13 NOTE — Progress Notes (Signed)
12/13/2022 Name: Benjamin Armstrong MRN: [id number] DOB: 06-29-1956  Chief Complaint  Patient presents with   Medication Management    Benjamin Armstrong is a 67 y.o. year old male who presented for a telephone visit.   They were referred to the pharmacist by their PCP for assistance in managing medication access  Subjective:  Care Team: Primary Care Provider: Glori LuisSonnenberg, Eric G, MD ; Next Scheduled Visit:   Medication Access/Adherence  Current Pharmacy:  [facility] [place], [place] - [address] [place] [id number] Phone: [phone] Fax: [phone]  [id number] [place], [place] - [address] [place] [id number] Phone: [phone] Fax: [phone]   Patient reports affordability concerns with their medications: Yes  Patient reports access/transportation concerns to their pharmacy: No  Patient reports adherence concerns with their medications:  No    Notes copay for Spiriva is $450. Appears patient may have a deductible to meet at the beginning of the year.   Hypertension:  Current medications: losartan 50 mg daily, additionally on torsemide 40 mg daily + potassium 20 mEq daily  Patient has a validated, automated, upper arm home BP cuff Current blood pressure readings readings: 113/63 before medications so he decided to hold losartan; reports reading yesterday afternoon was 152/80; reading this morning while we were on the phone (about 2 hours after medications) was 132/70  Patient denies hypotensive s/sx including dizziness, lightheadedness.  Patient denies hypertensive symptoms including headache, chest pain, shortness of breath  Does endorse improvement in swelling in feet/legs since changing from furosemide to torsemide  COPD:  Current medications: Spiriva 2.5 mcg 2 puffs daily- has ran out of sample; albuterol HFA PRN  Notes he was denied for Medicare Low Income Subsidy, but notes he was angry  about this and threw the letter away. He was informed by CPhT that the letter would be required for [facility] application for Spiriva  Hyperlipidemia/ASCVD Risk Reduction  Current lipid lowering medications: rosuvastatin 10 mg daily   Objective:  Lab Results  Component Value Date   HGBA1C 6.5 (A) 09/20/2022    Lab Results  Component Value Date   CREATININE 1.23 12/07/2022   BUN 24 (H) 12/07/2022   NA 137 12/07/2022   K 3.3 (L) 12/07/2022   CL 98 12/07/2022   CO2 31 12/07/2022    Lab Results  Component Value Date   CHOL 180 02/08/2022   HDL 41.10 02/08/2022   LDLCALC 111 (H) 02/08/2022   LDLDIRECT 101.0 07/18/2022   TRIG 139.0 02/08/2022   CHOLHDL 4 02/08/2022    Medications Reviewed Today     Reviewed by Alden HippHarper, Dayshawn Irizarry T, RPH-CPP (Pharmacist) on 12/13/22 at 1044  Med List Status: <None>   Medication Order Taking? Sig Documenting Provider Last Dose Status Informant  Accu-Chek Softclix Lancets lancets [id number]  Check sugar fasting and 2 hours after one meal each day Glori LuisSonnenberg, Eric G, MD  Active   albuterol (VENTOLIN HFA) 108 (90 Base) MCG/ACT inhaler [id number] Yes TAKE 2 PUFFS BY MOUTH EVERY 6 HOURS AS NEEDED FOR WHEEZE OR SHORTNESS OF BREATH Glori LuisSonnenberg, Eric G, MD Taking Active   EPINEPHrine 0.3 mg/0.3 mL IJ SOAJ injection [id number]  Inject 0.3 mg into the muscle as needed for anaphylaxis. McLean-Scocuzza, Pasty Spillersracy N, MD  Active   Famotidine (PEPCID AC PO) [id number] Yes Take 1 tablet by mouth as needed. [provider] Taking Active   losartan (COZAAR)  50 MG tablet [id number] Yes Take 1 tablet (50 mg total) by mouth daily. Debbe Odea, MD Taking Active   naproxen sodium (ALEVE) 220 MG tablet [id number]  Take 220 mg by mouth 2 (two) times daily as needed. [provider]  Active   potassium chloride SA (KLOR-CON M20) 20 MEQ tablet [id number] Yes Take 1 tablet (20 mEq total) by mouth daily. Debbe Odea, MD Taking Active   rosuvastatin  (CRESTOR) 10 MG tablet [id number] Yes Take 1 tablet (10 mg total) by mouth daily. Debbe Odea, MD Taking Active   sildenafil (VIAGRA) 100 MG tablet [id number]  Take 1 tablet (100 mg total) by mouth daily as needed for erectile dysfunction. Vanna Scotland, MD  Active   tamoxifen (NOLVADEX) 20 MG tablet [id number] Yes Take 1 tablet (20 mg total) by mouth daily. DO NOT START UNTIL directed by MD. Earna Coder, MD Taking Active   tamsulosin (FLOMAX) 0.4 MG CAPS capsule [id number] Yes Take 1 capsule (0.4 mg total) by mouth daily. Vanna Scotland, MD Taking Active   Tiotropium Bromide Monohydrate (SPIRIVA RESPIMAT) 2.5 MCG/ACT AERS [id number] No Inhale 2 puffs into the lungs daily.  Patient not taking: Reported on 12/13/2022   Glori Luis, MD Not Taking Active   Torsemide 40 MG TABS [id number] Yes Take 40 mg by mouth daily. Debbe Odea, MD Taking Active               Assessment/Plan:   Hypertension: - Currently controlled but with hypotensive episodes. - Reviewed appropriate blood pressure monitoring technique and reviewed goal blood pressure. Recommended to check home blood pressure and heart rate twice daily between now and follow up with PCP next week. Bring readings to appointment to discuss. Encouraged to contact myself or the office if development of systolic readings <100 - Contemplated moving losartan to evening administration, but patient notes he is up to use the bathroom 1-3 times per night, so higher risk of falls if hypotensive episodes overnight.  - Recommend to continue current regimen   Hyperlipidemia/ASCVD Risk Reduction: - Currently controlled. Fill history up to date - Recommend to continue current regimen at this time  COPD: - Currently uncontrolled due to medication access.  - Recommend to call SSA office to request copy of Medicare Low Income Subsidy denial letter. Once received, this can be submitted to [facility] for [facility] manufacturer  assistance. They will call me after they have talked to [facility]. - Will collaborate with clinical staff to prepare Spiriva sample   Follow Up Plan: follow up with PCP next week.  Catie Eppie Gibson, PharmD, BCACP, CPP [facility] [phone]

## 2022-12-14 ENCOUNTER — Inpatient Hospital Stay: Payer: Medicare Other

## 2022-12-14 DIAGNOSIS — C50822 Malignant neoplasm of overlapping sites of left male breast: Secondary | ICD-10-CM

## 2022-12-14 LAB — BASIC METABOLIC PANEL - CANCER CENTER ONLY
Anion gap: 12 (ref 5–15)
BUN: 26 mg/dL — ABNORMAL HIGH (ref 8–23)
CO2: 27 mmol/L (ref 22–32)
Calcium: 9.2 mg/dL (ref 8.9–10.3)
Chloride: 97 mmol/L — ABNORMAL LOW (ref 98–111)
Creatinine: 1.27 mg/dL — ABNORMAL HIGH (ref 0.61–1.24)
GFR, Estimated: >60 mL/min (ref >60)
Glucose, Bld: 108 mg/dL — ABNORMAL HIGH (ref 70–99)
Potassium: 4.3 mmol/L (ref 3.5–5.1)
Sodium: 136 mmol/L (ref 135–145)

## 2022-12-15 ENCOUNTER — Other Ambulatory Visit: Payer: Medicare Other

## 2022-12-15 ENCOUNTER — Ambulatory Visit: Payer: Medicare Other | Admitting: Internal Medicine

## 2022-12-18 ENCOUNTER — Other Ambulatory Visit: Payer: Self-pay | Admitting: Pharmacist

## 2022-12-18 ENCOUNTER — Encounter: Payer: Self-pay | Admitting: Cardiology

## 2022-12-18 NOTE — Progress Notes (Signed)
Care Coordination Call  Received message from patient's sister, June. They received a copy of the Medicare Low Income Subsidy denial letter for the patient. She will bring to [employer] front desk today for me.   Catie Eppie Gibson, PharmD, BCACP, CPP [facility] [phone]

## 2022-12-20 ENCOUNTER — Other Ambulatory Visit: Payer: Self-pay

## 2022-12-20 DIAGNOSIS — Z79899 Other long term (current) drug therapy: Secondary | ICD-10-CM

## 2022-12-21 ENCOUNTER — Telehealth: Payer: Self-pay | Admitting: Pharmacy Technician

## 2022-12-21 DIAGNOSIS — Z5986 Financial insecurity: Secondary | ICD-10-CM

## 2022-12-21 NOTE — Progress Notes (Signed)
[facility] Customer service manager [facility])                                            [facility] Quality Pharmacy Team    12/21/2022  Linell Shawn 02-28-1956 [id number]  Received patient's denial letter from social security for LIS . Faxed completed  required document into BI cares for Spiriva Respimat application.Noreene Larsson P. Pandora Mccrackin, CPhT [facility] [employer]  [phone]

## 2022-12-22 ENCOUNTER — Encounter: Payer: Self-pay | Admitting: Family Medicine

## 2022-12-22 ENCOUNTER — Ambulatory Visit (INDEPENDENT_AMBULATORY_CARE_PROVIDER_SITE_OTHER): Payer: Medicare Other | Admitting: Family Medicine

## 2022-12-22 VITALS — BP 128/68 | HR 71 | Temp 97.8°F | Ht 70.0 in | Wt 248.2 lb

## 2022-12-22 DIAGNOSIS — E119 Type 2 diabetes mellitus without complications: Secondary | ICD-10-CM

## 2022-12-22 DIAGNOSIS — R0609 Other forms of dyspnea: Secondary | ICD-10-CM

## 2022-12-22 DIAGNOSIS — I10 Essential (primary) hypertension: Secondary | ICD-10-CM | POA: Diagnosis not present

## 2022-12-22 LAB — POCT GLYCOSYLATED HEMOGLOBIN (HGB A1C): Hemoglobin A1C: 6.6 % — AB (ref 4.0–5.6)

## 2022-12-22 MED ORDER — SPIRIVA RESPIMAT 1.25 MCG/ACT IN AERS: 2.0000 | INHALATION_SPRAY | Freq: Every day | RESPIRATORY_TRACT | 0 refills | Status: DC

## 2022-12-22 MED ORDER — LOSARTAN POTASSIUM 25 MG PO TABS: 25.0000 mg | ORAL_TABLET | Freq: Every day | ORAL | 1 refills | Status: DC

## 2022-12-22 NOTE — Assessment & Plan Note (Addendum)
Chronic issue.  Likely COPD related.  Spiriva has been beneficial.  Will give him a sample of the 1.25 mcg Spiriva dose as we do not have the 2.5 mcg dose in the clinic.  Our clinical pharmacist is working on getting patient assistance for this.

## 2022-12-22 NOTE — Assessment & Plan Note (Signed)
Chronic issue.  A1c is generally stable.  He will continue to monitor his diet.  We discussed doing a foot exam today though he was concerned he would not be able to get his compression stockings back on.  He has a total uses to help with this.  He will bring this to his next visit so we can do a foot exam.

## 2022-12-22 NOTE — Patient Instructions (Signed)
Nice to see you. Please try the Spiriva inhalers that we have provided.  Our pharmacist will continue to work on getting you patient assistance for Spiriva. We will reduce your losartan dose to 25 mg daily.  If your lightheadedness does not improve with this dose reduction please let us know.

## 2022-12-22 NOTE — Assessment & Plan Note (Signed)
Chronic issue.  Adequately controlled.  We are going to reduce his losartan to 25 mg daily and have him continue torsemide 40 mg daily.  If his lightheadedness does not improve with the dose reduction losartan he will let us know.

## 2022-12-22 NOTE — Progress Notes (Signed)
Marikay Alar, MD Phone: [phone]  Benjamin Armstrong is a 67 y.o. male who presents today for f/u.  COPD: Medication compliance- off spiriva due to cost  Rescue inhaler use- uses albuterol PRN Dyspnea- chronic and stable   Wheezing- no  Cough- yes   Spiriva was helpful previously.   HYPERTENSION Disease Monitoring Home BP Monitoring 104-135/56/74 Chest pain- no    Dyspnea- chronic and stable Medications Compliance-  taking losartan, torsemide. Lightheadedness- yes, occasionally  Edema- no BMET    Component Value Date/Time   NA 136 12/14/2022 1255   NA 137 05/04/2022 0000   K 4.3 12/14/2022 1255   CL 97 (L) 12/14/2022 1255   CO2 27 12/14/2022 1255   GLUCOSE 108 (H) 12/14/2022 1255   BUN 26 (H) 12/14/2022 1255   BUN 16 05/04/2022 0000   CREATININE 1.27 (H) 12/14/2022 1255   CALCIUM 9.2 12/14/2022 1255   GFRNONAA >60 12/14/2022 1255   Diabetes: Patient has cut out sweets.  He notes polydipsia most of the time since he has been on torsemide.   Social History   Tobacco Use  Smoking Status Every Day   Packs/day: 1.00   Years: 53.00   Additional pack years: 0.00   Total pack years: 53.00   Types: Cigarettes  Smokeless Tobacco Never    Current Outpatient Medications on File Prior to Visit  Medication Sig Dispense Refill   albuterol (VENTOLIN HFA) 108 (90 Base) MCG/ACT inhaler TAKE 2 PUFFS BY MOUTH EVERY 6 HOURS AS NEEDED FOR WHEEZE OR SHORTNESS OF BREATH 18 each 0   EPINEPHrine 0.3 mg/0.3 mL IJ SOAJ injection Inject 0.3 mg into the muscle as needed for anaphylaxis. 2 each 1   Famotidine (PEPCID AC PO) Take 1 tablet by mouth as needed.     naproxen sodium (ALEVE) 220 MG tablet Take 220 mg by mouth 2 (two) times daily as needed.     potassium chloride SA (KLOR-CON M20) 20 MEQ tablet Take 1 tablet (20 mEq total) by mouth daily. 30 tablet 11   rosuvastatin (CRESTOR) 10 MG tablet Take 1 tablet (10 mg total) by mouth daily. 90 tablet 2   sildenafil (VIAGRA) 100 MG tablet  Take 1 tablet (100 mg total) by mouth daily as needed for erectile dysfunction. 30 tablet 11   tamoxifen (NOLVADEX) 20 MG tablet Take 1 tablet (20 mg total) by mouth daily. DO NOT START UNTIL directed by MD. 90 tablet 1   torsemide (DEMADEX) 20 MG tablet Take 40 mg by mouth daily.     Accu-Chek Softclix Lancets lancets Check sugar fasting and 2 hours after one meal each day (Patient not taking: Reported on 12/22/2022) 100 each 3   No current facility-administered medications on file prior to visit.     ROS see history of present illness  Objective  Physical Exam Vitals:   12/22/22 1204  BP: 128/68  Pulse: 71  Temp: 97.8 F (36.6 C)  SpO2: 94%    BP Readings from Last 3 Encounters:  12/22/22 128/68  11/27/22 (!) 112/58  11/08/22 130/60   Wt Readings from Last 3 Encounters:  12/22/22 248 lb 3.2 oz (112.6 kg)  11/27/22 257 lb 12.8 oz (116.9 kg)  11/08/22 256 lb 6.4 oz (116.3 kg)    Physical Exam Constitutional:      General: He is not in acute distress.    Appearance: He is not diaphoretic.  Cardiovascular:     Rate and Rhythm: Normal rate and regular rhythm.     Heart  sounds: Normal heart sounds.  Pulmonary:     Effort: Pulmonary effort is normal.     Breath sounds: Normal breath sounds.  Skin:    General: Skin is warm and dry.  Neurological:     Mental Status: He is alert.      Assessment/Plan: Please see individual problem list.  Type 2 diabetes mellitus without complication, without long-term current use of insulin Assessment & Plan: Chronic issue.  A1c is generally stable.  He will continue to monitor his diet.  We discussed doing a foot exam today though he was concerned he would not be able to get his compression stockings back on.  He has a total uses to help with this.  He will bring this to his next visit so we can do a foot exam.  Orders: -     POCT glycosylated hemoglobin (Hb A1C)  DOE (dyspnea on exertion) Assessment & Plan: Chronic issue.   Likely COPD related.  Spiriva has been beneficial.  Will give him a sample of the 1.25 mcg Spiriva dose as we do not have the 2.5 mcg dose in the clinic.  Our clinical pharmacist is working on getting patient assistance for this.  Orders: -     Spiriva Respimat; Inhale 2 puffs into the lungs daily.  Dispense: 8 g; Refill: 0  Essential hypertension Assessment & Plan: Chronic issue.  Adequately controlled.  We are going to reduce his losartan to 25 mg daily and have him continue torsemide 40 mg daily.  If his lightheadedness does not improve with the dose reduction losartan he will let us know.  Orders: -     Losartan Potassium; Take 1 tablet (25 mg total) by mouth daily.  Dispense: 90 tablet; Refill: 1  Patient was advised to quit smoking.  He notes he is not quite ready to quit.  Return in about 3 months (around 03/23/2023) for COPD.   Marikay Alar, MD [facility]

## 2022-12-23 ENCOUNTER — Encounter: Payer: Self-pay | Admitting: Internal Medicine

## 2022-12-25 ENCOUNTER — Other Ambulatory Visit: Payer: Self-pay | Admitting: *Deleted

## 2022-12-25 MED ORDER — TAMOXIFEN CITRATE 20 MG PO TABS: 20.0000 mg | ORAL_TABLET | Freq: Every day | ORAL | 1 refills | Status: DC

## 2022-12-28 ENCOUNTER — Telehealth: Payer: Self-pay | Admitting: Pharmacy Technician

## 2022-12-28 ENCOUNTER — Other Ambulatory Visit: Payer: Self-pay | Admitting: Family Medicine

## 2022-12-28 DIAGNOSIS — R0609 Other forms of dyspnea: Secondary | ICD-10-CM

## 2022-12-28 DIAGNOSIS — Z5986 Financial insecurity: Secondary | ICD-10-CM

## 2022-12-28 NOTE — Progress Notes (Signed)
[facility])                                            [facility] Quality Pharmacy Team    12/28/2022  Benjamin Armstrong 1956/03/11 [id number]  Care coordination call placed to [employer] in regard to Spiriva Respimat application.   Spoke to Benjamin Armstrong. Informed Benjamin Armstrong that in Feb the ask from [employer] was to have patient apply for LIS. Informed Benjamin Armstrong that the denial letter was sent into [employer].  Benjamin Armstrong informs patient is APPROVED 10/07/22-09/04/23. She informs the approval date was retro dated back to February. initial shipment of medication will automatically fill and ship out to patient's home address on the application. Patient will need to call in subsequent refills to [facility] at [phone].  Benjamin Armstrong P. Benjamin Armstrong, CPhT [facility]  [phone]   Benjamin Armstrong P. Benjamin Armstrong, CPhT [facility]  [phone]

## 2022-12-29 ENCOUNTER — Other Ambulatory Visit: Payer: Self-pay

## 2022-12-29 DIAGNOSIS — R0609 Other forms of dyspnea: Secondary | ICD-10-CM

## 2022-12-29 MED ORDER — ALBUTEROL SULFATE HFA 108 (90 BASE) MCG/ACT IN AERS: 2.0000 | INHALATION_SPRAY | Freq: Four times a day (QID) | RESPIRATORY_TRACT | 0 refills | Status: DC | PRN

## 2023-01-05 ENCOUNTER — Other Ambulatory Visit: Payer: Self-pay | Admitting: *Deleted

## 2023-01-05 ENCOUNTER — Inpatient Hospital Stay: Payer: Medicare Other | Attending: Oncology

## 2023-01-05 DIAGNOSIS — Z79810 Long term (current) use of selective estrogen receptor modulators (SERMs): Secondary | ICD-10-CM | POA: Insufficient documentation

## 2023-01-05 DIAGNOSIS — C50822 Malignant neoplasm of overlapping sites of left male breast: Secondary | ICD-10-CM | POA: Diagnosis present

## 2023-01-05 DIAGNOSIS — Z79899 Other long term (current) drug therapy: Secondary | ICD-10-CM

## 2023-01-05 DIAGNOSIS — Z170 Estrogen receptor positive status [ER+]: Secondary | ICD-10-CM

## 2023-01-05 LAB — MAGNESIUM: Magnesium: 2.2 mg/dL (ref 1.7–2.4)

## 2023-01-05 LAB — BASIC METABOLIC PANEL
Anion gap: 11 (ref 5–15)
BUN: 23 mg/dL (ref 8–23)
CO2: 25 mmol/L (ref 22–32)
Calcium: 8.6 mg/dL — ABNORMAL LOW (ref 8.9–10.3)
Chloride: 101 mmol/L (ref 98–111)
Creatinine, Ser: 1.14 mg/dL (ref 0.61–1.24)
GFR, Estimated: >60 mL/min (ref >60)
Glucose, Bld: 114 mg/dL — ABNORMAL HIGH (ref 70–99)
Potassium: 3.6 mmol/L (ref 3.5–5.1)
Sodium: 137 mmol/L (ref 135–145)

## 2023-01-08 ENCOUNTER — Encounter: Payer: Self-pay | Admitting: Cardiology

## 2023-01-08 ENCOUNTER — Ambulatory Visit: Payer: Medicare Other | Attending: Cardiology | Admitting: Cardiology

## 2023-01-08 VITALS — BP 132/72 | HR 83 | Ht 74.0 in | Wt 251.8 lb

## 2023-01-08 DIAGNOSIS — E782 Mixed hyperlipidemia: Secondary | ICD-10-CM | POA: Diagnosis not present

## 2023-01-08 DIAGNOSIS — R600 Localized edema: Secondary | ICD-10-CM | POA: Diagnosis not present

## 2023-01-08 DIAGNOSIS — F17200 Nicotine dependence, unspecified, uncomplicated: Secondary | ICD-10-CM | POA: Insufficient documentation

## 2023-01-08 DIAGNOSIS — I10 Essential (primary) hypertension: Secondary | ICD-10-CM | POA: Diagnosis not present

## 2023-01-08 MED ORDER — EZETIMIBE 10 MG PO TABS: 10.0000 mg | ORAL_TABLET | Freq: Every day | ORAL | 3 refills | Status: DC

## 2023-01-08 MED ORDER — TORSEMIDE 20 MG PO TABS: 60.0000 mg | ORAL_TABLET | Freq: Every day | ORAL | 0 refills | Status: DC

## 2023-01-08 NOTE — Patient Instructions (Signed)
Medication Instructions:   1. INCREASE Torsemide - take three tablets ( 60mg ) by mouth daily.   *If you need a refill on your cardiac medications before your next appointment, please call your pharmacy*   Lab Work:  Your physician recommends that you return for lab work in: 10 days at the medical mall.  No appt is needed. Hours are M-F 7AM- 6 PM.  If you have labs (blood work) drawn today and your tests are completely normal, you will receive your results only by: MyChart Message (if you have MyChart) OR A paper copy in the mail If you have any lab test that is abnormal or we need to change your treatment, we will call you to review the results.   Testing/Procedures:  Your physician has requested that you have an echocardiogram. Echocardiography is a painless test that uses sound waves to create images of your heart. It provides your doctor with information about the size and shape of your heart and how well your heart's chambers and valves are working. This procedure takes approximately one hour. There are no restrictions for this procedure. Please do NOT wear cologne, perfume, aftershave, or lotions (deodorant is allowed). Please arrive 15 minutes prior to your appointment time.    Follow-Up: At [facility], you and your health needs are our priority.  As part of our continuing mission to provide you with exceptional heart care, we have created designated Provider Care Teams.  These Care Teams include your primary Cardiologist (physician) and Advanced Practice Providers (APPs -  Physician Assistants and Nurse Practitioners) who all work together to provide you with the care you need, when you need it.  We recommend signing up for the patient portal called "MyChart".  Sign up information is provided on this After Visit Summary.  MyChart is used to connect with patients for Virtual Visits (Telemedicine).  Patients are able to view lab/test results, encounter notes, upcoming  appointments, etc.  Non-urgent messages can be sent to your provider as well.   To learn more about what you can do with MyChart, go to [email or web address].    Your next appointment:    After Echocardiogram  Provider:   You may see Debbe Odea, MD or one of the following Advanced Practice Providers on your designated Care Team:   Nicolasa Ducking, NP Eula Listen, PA-C Cadence Fransico Michael, PA-C Charlsie Quest, NP
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
## 2023-01-16 ENCOUNTER — Inpatient Hospital Stay: Payer: Medicare Other

## 2023-01-16 DIAGNOSIS — C50822 Malignant neoplasm of overlapping sites of left male breast: Secondary | ICD-10-CM | POA: Diagnosis not present

## 2023-01-16 DIAGNOSIS — R600 Localized edema: Secondary | ICD-10-CM

## 2023-01-16 LAB — BASIC METABOLIC PANEL
Anion gap: 12 (ref 5–15)
BUN: 22 mg/dL (ref 8–23)
CO2: 29 mmol/L (ref 22–32)
Calcium: 8.8 mg/dL — ABNORMAL LOW (ref 8.9–10.3)
Chloride: 96 mmol/L — ABNORMAL LOW (ref 98–111)
Creatinine, Ser: 1.19 mg/dL (ref 0.61–1.24)
GFR, Estimated: >60 mL/min (ref >60)
Glucose, Bld: 139 mg/dL — ABNORMAL HIGH (ref 70–99)
Potassium: 3.3 mmol/L — ABNORMAL LOW (ref 3.5–5.1)
Sodium: 137 mmol/L (ref 135–145)

## 2023-01-23 ENCOUNTER — Ambulatory Visit: Payer: Medicare Other | Attending: Cardiology

## 2023-01-23 DIAGNOSIS — R600 Localized edema: Secondary | ICD-10-CM | POA: Diagnosis present

## 2023-01-23 LAB — ECHOCARDIOGRAM COMPLETE
AR max vel: 2.26 cm2
AV Area VTI: 2.7 cm2
AV Area mean vel: 2.54 cm2
AV Mean grad: 4 mmHg
AV Peak grad: 9.4 mmHg
Ao pk vel: 1.53 m/s
Area-P 1/2: 4.89 cm2
S' Lateral: 2.55 cm

## 2023-02-01 ENCOUNTER — Encounter: Payer: Self-pay | Admitting: Cardiology

## 2023-02-01 ENCOUNTER — Ambulatory Visit: Payer: Medicare Other | Attending: Cardiology | Admitting: Cardiology

## 2023-02-01 ENCOUNTER — Ambulatory Visit
Admission: RE | Admit: 2023-02-01 | Discharge: 2023-02-01 | Disposition: A | Discharge status: Home / Self Care | Payer: Medicare Other | Source: Ambulatory Visit | Attending: Internal Medicine | Admitting: Internal Medicine

## 2023-02-01 VITALS — BP 129/72 | HR 83 | Ht 74.0 in | Wt 251.4 lb

## 2023-02-01 DIAGNOSIS — I10 Essential (primary) hypertension: Secondary | ICD-10-CM | POA: Diagnosis present

## 2023-02-01 DIAGNOSIS — Z720 Tobacco use: Secondary | ICD-10-CM | POA: Diagnosis present

## 2023-02-01 DIAGNOSIS — R0602 Shortness of breath: Secondary | ICD-10-CM | POA: Insufficient documentation

## 2023-02-01 DIAGNOSIS — R600 Localized edema: Secondary | ICD-10-CM | POA: Insufficient documentation

## 2023-02-01 DIAGNOSIS — Z79899 Other long term (current) drug therapy: Secondary | ICD-10-CM | POA: Diagnosis present

## 2023-02-01 DIAGNOSIS — E782 Mixed hyperlipidemia: Secondary | ICD-10-CM | POA: Insufficient documentation

## 2023-02-01 DIAGNOSIS — Z170 Estrogen receptor positive status [ER+]: Secondary | ICD-10-CM

## 2023-02-01 DIAGNOSIS — R252 Cramp and spasm: Secondary | ICD-10-CM | POA: Diagnosis present

## 2023-02-01 MED ORDER — TORSEMIDE 20 MG PO TABS: 60.0000 mg | ORAL_TABLET | Freq: Every day | ORAL | 2 refills | Status: DC

## 2023-02-01 MED ORDER — POTASSIUM CHLORIDE CRYS ER 20 MEQ PO TBCR: 20.0000 meq | EXTENDED_RELEASE_TABLET | Freq: Every day | ORAL | 3 refills | Status: DC

## 2023-02-01 MED ORDER — EZETIMIBE 10 MG PO TABS: 10.0000 mg | ORAL_TABLET | Freq: Every day | ORAL | 2 refills | Status: DC

## 2023-02-01 NOTE — Patient Instructions (Addendum)
Medication Instructions:   Your physician recommends that you continue on your current medications as directed. Please refer to the Current Medication list given to you today.  *If you need a refill on your cardiac medications before your next appointment, please call your pharmacy*   Lab Work:  Your physician recommends that you return for lab work at the Cancer center as  you requested your orders have been placed in Epic.  BMP/MAGNESIUM   If you have labs (blood work) drawn today and your tests are completely normal, you will receive your results only by: MyChart Message (if you have MyChart) OR A paper copy in the mail If you have any lab test that is abnormal or we need to change your treatment, we will call you to review the results.   Testing/Procedures:  No testing ordered today   Follow-Up: At [facility], you and your health needs are our priority.  As part of our continuing mission to provide you with exceptional heart care, we have created designated Provider Care Teams.  These Care Teams include your primary Cardiologist (physician) and Advanced Practice Providers (APPs -  Physician Assistants and Nurse Practitioners) who all work together to provide you with the care you need, when you need it.  We recommend signing up for the patient portal called "MyChart".  Sign up information is provided on this After Visit Summary.  MyChart is used to connect with patients for Virtual Visits (Telemedicine).  Patients are able to view lab/test results, encounter notes, upcoming appointments, etc.  Non-urgent messages can be sent to your provider as well.   To learn more about what you can do with MyChart, go to [email or web address].    Your next appointment:   3 month(s)  Provider:   You may see Debbe Odea, MD or one of the following Advanced Practice Providers on your designated Care Team:   Nicolasa Ducking, NP Eula Listen, PA-C Cadence Fransico Michael, PA-C Charlsie Quest, NP

## 2023-02-01 NOTE — Progress Notes (Signed)
Cardiology Office Note:   Date:  02/01/2023  ID:  Benjamin Armstrong, DOB June 11, 1956, MRN [id number] PCP: Glori Luis, MD  [facility] Providers Cardiologist:  Debbe Odea, MD    History of Present Illness:   Benjamin Armstrong is a 67 y.o. male with past medical history of  hypertension, mixed hyperlipidemia, bilateral leg edema, tobacco abuse, left breast cancer status postresection, lung cancer, who presents today for follow-up.  Echocardiogram in 6/21 showed normal systolic function with EF of 60 to 65%, with impaired relaxation.  Exercise tolerance test in 02/2019 revealed submaximal stress levels, no evidence of inducible ischemia.  He was last seen in clinic 01/08/2023 by Dr.Agbor-Etang.  At that time he had complaints of peripheral edema since starting therapy for lung cancer.  He was started on furosemide with improvement in symptoms.  Continues to smoke although he had cut back.  Blood pressures are running slightly low so losartan been decreased and eventually stopped by his PCP.  Blood pressures at home have been running normal.  In describing his arms.  Hyperlipidemia confessed to hold objects.  His rosuvastatin was ultimately stopped and he was started on ezetimibe 10 mg daily.  He was stopped after repeat echocardiogram and his torsemide was increased to 60 mg daily with a BMP in 10 days.   He returns to clinic today accompanied by his wife.  He states he continues to have swelling to his bilateral lower extremities but has improved since starting on diuretic therapy.  Cramping to his hands and feet even with rosuvastatin discontinued and started on lisinopril.  Occasionally has some shortness of breath and wheezing which she has a rescue inhaler uses.  He also continues to have a sticking out like a sensation of randomly different areas of his chest that is unchanged over the past 30 years.  He denies any recent hospitalizations or visits to the emergency department.  He stated  that he is taking the diuretic as well and has the potassium.  After being restarted on potassium for low potassium on his first BMP was advised to repeat blood work in 10 more days unfortunately the patient was unaware of that.  ROS: 10 point review of systems is completed and considered negative with exception of what is listed in the HPI  Studies Reviewed:    EKG: No new tracings have been completed today  TTE 01/23/23  1. Left ventricular ejection fraction, by estimation, is 55 to 60%. The  left ventricle has normal function. The left ventricle has no regional  wall motion abnormalities. Left ventricular diastolic parameters are  consistent with Grade I diastolic  dysfunction (impaired relaxation).   2. Right ventricular systolic function is normal. The right ventricular  size is normal. Tricuspid regurgitation signal is inadequate for assessing  PA pressure.   3. The mitral valve is normal in structure. No evidence of mitral valve  regurgitation. No evidence of mitral stenosis.   4. The aortic valve is normal in structure. Aortic valve regurgitation is  not visualized. No aortic stenosis is present.   5. The inferior vena cava is normal in size with greater than 50%  respiratory variability, suggesting right atrial pressure of 3 mmHg.   Risk Assessment/Calculations:              Physical Exam:   VS:  BP 129/72 (BP Location: Right Arm, Patient Position: Sitting, Cuff Size: Large)   Pulse 83   Ht 6\' 2"  (1.88 m)   Wt 251  lb 6.4 oz (114 kg)   SpO2 94%   BMI 32.28 kg/m    Wt Readings from Last 3 Encounters:  02/01/23 251 lb 6.4 oz (114 kg)  01/08/23 251 lb 12.8 oz (114.2 kg)  12/22/22 248 lb 3.2 oz (112.6 kg)     GEN: Well nourished, well developed in no acute distress NECK: No JVD; No carotid bruits CARDIAC: RRR, no murmurs, rubs, gallops RESPIRATORY: Diminished to auscultation without rales, wheezing or rhonchi  ABDOMEN: Soft, non-tender, non-distended EXTREMITIES:   1+ edema BLE compression socks on today; No deformity   ASSESSMENT AND PLAN:   Chronic shortness of breath with peripheral edema with recent echocardiogram completed which revealed an LVEF of 55 to 60%, no regional wall motion abnormalities, G1 DD, no valvular abnormalities noted.  He is continued on torsemide 60 mg daily and potassium 20 mEq daily.  He has been sent for follow-up BMP as well as symptomatic today.  Also encouraged to continue to use his rescue inhaler before strenuous activity to prevent bronchospasms.  Mixed hyperlipidemia previous recently stopped taking rosuvastatin and started on ezetimibe 10 mg daily.  Will need a repeat lipid and hepatic panel in 3 months to reevaluate effectiveness.  Essential hypertension with blood pressure 129/72 off of antihypertensive medication and has remained stable.  He is encouraged to continue to monitor his pressures at home as well.  Muscle cramping to the hands and feet with recent statin medication change.  He has continued to drink a Gatorade to replenish electrolytes.  He is being sent for BMP and magnesium level today.  Tobacco abuse is a current smoker x 50+ years.  Total cessation is recommended.  Disposition patient return to clinic to see MD/APP in 3 months or sooner if needed        Signed, Adalynn Corne, NP

## 2023-02-02 ENCOUNTER — Telehealth: Payer: Self-pay | Admitting: Cardiology

## 2023-02-02 NOTE — Telephone Encounter (Signed)
Sister wants call back to confirm patient's lab orders have been sent to [facility].

## 2023-02-02 NOTE — Telephone Encounter (Signed)
Spoke with EC and advised orders are in and pending. She was appreciative for the call back with no further questions or needs at this time.

## 2023-02-06 ENCOUNTER — Inpatient Hospital Stay: Payer: Medicare Other | Attending: Oncology

## 2023-02-06 DIAGNOSIS — Z79810 Long term (current) use of selective estrogen receptor modulators (SERMs): Secondary | ICD-10-CM | POA: Diagnosis not present

## 2023-02-06 DIAGNOSIS — Z801 Family history of malignant neoplasm of trachea, bronchus and lung: Secondary | ICD-10-CM | POA: Diagnosis not present

## 2023-02-06 DIAGNOSIS — C50822 Malignant neoplasm of overlapping sites of left male breast: Secondary | ICD-10-CM | POA: Diagnosis not present

## 2023-02-06 DIAGNOSIS — Z9012 Acquired absence of left breast and nipple: Secondary | ICD-10-CM | POA: Insufficient documentation

## 2023-02-06 DIAGNOSIS — Z170 Estrogen receptor positive status [ER+]: Secondary | ICD-10-CM | POA: Insufficient documentation

## 2023-02-06 DIAGNOSIS — R918 Other nonspecific abnormal finding of lung field: Secondary | ICD-10-CM | POA: Insufficient documentation

## 2023-02-06 DIAGNOSIS — F17210 Nicotine dependence, cigarettes, uncomplicated: Secondary | ICD-10-CM | POA: Insufficient documentation

## 2023-02-06 DIAGNOSIS — Z923 Personal history of irradiation: Secondary | ICD-10-CM | POA: Diagnosis not present

## 2023-02-06 DIAGNOSIS — Z803 Family history of malignant neoplasm of breast: Secondary | ICD-10-CM | POA: Insufficient documentation

## 2023-02-06 DIAGNOSIS — Z8049 Family history of malignant neoplasm of other genital organs: Secondary | ICD-10-CM | POA: Diagnosis not present

## 2023-02-06 DIAGNOSIS — Z79899 Other long term (current) drug therapy: Secondary | ICD-10-CM | POA: Insufficient documentation

## 2023-02-06 DIAGNOSIS — Z8042 Family history of malignant neoplasm of prostate: Secondary | ICD-10-CM | POA: Insufficient documentation

## 2023-02-06 LAB — CMP (CANCER CENTER ONLY)
ALT: 29 U/L (ref 0–44)
AST: 25 U/L (ref 15–41)
Albumin: 3.8 g/dL (ref 3.5–5.0)
Alkaline Phosphatase: 48 U/L (ref 38–126)
Anion gap: 9 (ref 5–15)
BUN: 22 mg/dL (ref 8–23)
CO2: 28 mmol/L (ref 22–32)
Calcium: 8.7 mg/dL — ABNORMAL LOW (ref 8.9–10.3)
Chloride: 98 mmol/L (ref 98–111)
Creatinine: 1.12 mg/dL (ref 0.61–1.24)
GFR, Estimated: >60 mL/min (ref >60)
Glucose, Bld: 126 mg/dL — ABNORMAL HIGH (ref 70–99)
Potassium: 3.3 mmol/L — ABNORMAL LOW (ref 3.5–5.1)
Sodium: 135 mmol/L (ref 135–145)
Total Bilirubin: 0.3 mg/dL (ref 0.3–1.2)
Total Protein: 7.2 g/dL (ref 6.5–8.1)

## 2023-02-06 LAB — CBC WITH DIFFERENTIAL (CANCER CENTER ONLY)
Abs Immature Granulocytes: 0.04 10*3/uL (ref 0.00–0.07)
Basophils Absolute: 0.1 10*3/uL (ref 0.0–0.1)
Basophils Relative: 1 %
Eosinophils Absolute: 0.3 10*3/uL (ref 0.0–0.5)
Eosinophils Relative: 4 %
HCT: 47.6 % (ref 39.0–52.0)
Hemoglobin: 15.5 g/dL (ref 13.0–17.0)
Immature Granulocytes: 1 %
Lymphocytes Relative: 20 %
Lymphs Abs: 1.8 10*3/uL (ref 0.7–4.0)
MCH: 29.4 pg (ref 26.0–34.0)
MCHC: 32.6 g/dL (ref 30.0–36.0)
MCV: 90.3 fL (ref 80.0–100.0)
Monocytes Absolute: 0.9 10*3/uL (ref 0.1–1.0)
Monocytes Relative: 11 %
Neutro Abs: 5.6 10*3/uL (ref 1.7–7.7)
Neutrophils Relative %: 63 %
Platelet Count: 184 10*3/uL (ref 150–400)
RBC: 5.27 MIL/uL (ref 4.22–5.81)
RDW: 13.9 % (ref 11.5–15.5)
WBC Count: 8.8 10*3/uL (ref 4.0–10.5)
nRBC: 0 % (ref 0.0–0.2)

## 2023-02-12 ENCOUNTER — Other Ambulatory Visit: Payer: Medicare Other

## 2023-02-12 ENCOUNTER — Ambulatory Visit: Payer: Medicare Other | Admitting: Internal Medicine

## 2023-02-15 ENCOUNTER — Encounter: Payer: Self-pay | Admitting: Radiation Oncology

## 2023-02-15 ENCOUNTER — Telehealth: Payer: Self-pay | Admitting: *Deleted

## 2023-02-15 ENCOUNTER — Inpatient Hospital Stay: Payer: Medicare Other

## 2023-02-15 ENCOUNTER — Other Ambulatory Visit: Payer: Self-pay | Admitting: *Deleted

## 2023-02-15 ENCOUNTER — Other Ambulatory Visit: Payer: Self-pay

## 2023-02-15 ENCOUNTER — Ambulatory Visit
Admission: RE | Admit: 2023-02-15 | Discharge: 2023-02-15 | Disposition: A | Discharge status: Home / Self Care | Payer: Medicare Other | Source: Ambulatory Visit | Attending: Radiation Oncology | Admitting: Radiation Oncology

## 2023-02-15 VITALS — BP 132/71 | HR 93 | Temp 98.7°F | Resp 18 | Ht 74.0 in | Wt 249.9 lb

## 2023-02-15 DIAGNOSIS — C50822 Malignant neoplasm of overlapping sites of left male breast: Secondary | ICD-10-CM | POA: Insufficient documentation

## 2023-02-15 DIAGNOSIS — Z923 Personal history of irradiation: Secondary | ICD-10-CM | POA: Insufficient documentation

## 2023-02-15 DIAGNOSIS — R252 Cramp and spasm: Secondary | ICD-10-CM

## 2023-02-15 DIAGNOSIS — R918 Other nonspecific abnormal finding of lung field: Secondary | ICD-10-CM | POA: Insufficient documentation

## 2023-02-15 DIAGNOSIS — Z170 Estrogen receptor positive status [ER+]: Secondary | ICD-10-CM

## 2023-02-15 DIAGNOSIS — Z79899 Other long term (current) drug therapy: Secondary | ICD-10-CM

## 2023-02-15 DIAGNOSIS — Z9012 Acquired absence of left breast and nipple: Secondary | ICD-10-CM | POA: Insufficient documentation

## 2023-02-15 LAB — CMP (CANCER CENTER ONLY)
ALT: 27 U/L (ref 0–44)
AST: 26 U/L (ref 15–41)
Albumin: 3.7 g/dL (ref 3.5–5.0)
Alkaline Phosphatase: 47 U/L (ref 38–126)
Anion gap: 12 (ref 5–15)
BUN: 22 mg/dL (ref 8–23)
CO2: 27 mmol/L (ref 22–32)
Calcium: 9.1 mg/dL (ref 8.9–10.3)
Chloride: 98 mmol/L (ref 98–111)
Creatinine: 1.12 mg/dL (ref 0.61–1.24)
GFR, Estimated: >60 mL/min (ref >60)
Glucose, Bld: 148 mg/dL — ABNORMAL HIGH (ref 70–99)
Potassium: 3.4 mmol/L — ABNORMAL LOW (ref 3.5–5.1)
Sodium: 137 mmol/L (ref 135–145)
Total Bilirubin: 0.2 mg/dL — ABNORMAL LOW (ref 0.3–1.2)
Total Protein: 7.1 g/dL (ref 6.5–8.1)

## 2023-02-15 LAB — CBC (CANCER CENTER ONLY)
HCT: 46.3 % (ref 39.0–52.0)
Hemoglobin: 15.2 g/dL (ref 13.0–17.0)
MCH: 29.7 pg (ref 26.0–34.0)
MCHC: 32.8 g/dL (ref 30.0–36.0)
MCV: 90.6 fL (ref 80.0–100.0)
Platelet Count: 184 10*3/uL (ref 150–400)
RBC: 5.11 MIL/uL (ref 4.22–5.81)
RDW: 13.9 % (ref 11.5–15.5)
WBC Count: 9.4 10*3/uL (ref 4.0–10.5)
nRBC: 0 % (ref 0.0–0.2)

## 2023-02-15 LAB — MAGNESIUM: Magnesium: 2.1 mg/dL (ref 1.7–2.4)

## 2023-02-15 NOTE — Telephone Encounter (Addendum)
Per Lawson Fiscal, RN, pt's sister called radiation to request for lab apt today after radiation. Pt's family told Lawson Fiscal that "Labs were already ordered by Forde Radon RN reviewed chart.  Pt has an apt next Monday 02/19/23 with Dr. Leonard Schwartz and labs-cbc/metc. Consuello Masse, NP has never seen patient. Cardiology previously requested pt to have labs drawn. Per chart-a cbc/metc was drawn on 6/3 in cancer center (No phone note on chart regarding labs from 6/3 by cancer center team. There was only a note on lab schedule- "labs from Dr. Gayla Doss per inbasket.")  Per Lawson Fiscal, family stated that a mag level was needing to be drawn today. I did not see any notes in epic regarding need for additional labs at cancer center for cardiology team. I personally reached out to cardiologist Dr. Azucena Cecil and pt's cardiology RN- Bernette Mayers. Per Cassell Smiles, NP wanted a mag level to be drawn. Ms. Cathe Mons had personally ordered the mag level and order in order review. Per v/o Consuello Masse, NP- ok to draw mag level here. Per Noreene Filbert Hammock ordered the mag level for "muscle cramps and pt on statin."  Mag level Order entered under [employer]. We can fwd the results to cardiology.  We need to clarify from Dr. Senaida Lange team if any labs are still needed on Monday 6/3 when pt sees Dr. B since pt has already had a lab work up in the last week.

## 2023-02-15 NOTE — Telephone Encounter (Signed)
Mag level results fwd to cardiology

## 2023-02-15 NOTE — Progress Notes (Signed)
Radiation Oncology Follow up Note  Name: Benjamin Armstrong   Date:   02/15/2023 MRN:  [id number] DOB: 10-25-1955    This 67 y.o. 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  Seen today in routine follow-up he is doing fairly well he states he was working his car last week and he thinks he broke some ribs.  He had a recent CT scan.  Showing waxing and waning bilateral pulmonary nodules approximately 50 number.  Additional follow-up CT scan of chest is suggested in 3 to 6 months.  COMPLICATIONS OF TREATMENT: none  FOLLOW UP COMPLIANCE: keeps appointments   PHYSICAL EXAM:  BP 132/71   Pulse 93   Temp 98.7 F (37.1 C)   Resp 18   Ht 6\' 2"  (1.88 m)   Wt 249 lb 14.4 oz (113.4 kg)   SpO2 96%   BMI 32.09 kg/m  Patient status post left modified radical mastectomy chest wall is clear without evidence of nodularity or mass.  Right breast is free of dominant mass no axillary or supraclavicular adenopathy is identified.  Well-developed well-nourished patient in NAD. HEENT reveals PERLA, EOMI, discs not visualized.  Oral cavity is clear. No oral mucosal lesions are identified. Neck is clear without evidence of cervical or supraclavicular adenopathy. Lungs are clear to A&P. Cardiac examination is essentially unremarkable with regular rate and rhythm without murmur rub or thrill. Abdomen is benign with no organomegaly or masses noted. Motor sensory and DTR levels are equal and symmetric in the upper and lower extremities. Cranial nerves II through XII are  grossly intact. Proprioception is intact. No peripheral adenopathy or edema is identified. No motor or sensory levels are noted. Crude visual fields are within normal range.  RADIOLOGY RESULTS: CT scan reviewed compatible with above-stated findings.  PLAN: Present time patient clinically is doing well.  Anticipate Dr. B to order a follow-up CT scan in 3 to 4 months.  Certainly not sure the etiology of his pulmonary nodules although certainly may be infectious based on the waxing waning pattern.  I have asked to see the patient back in 4 to 5 months for follow-up.  Patient is to call sooner with any concerns.  I would like to take this opportunity to thank you for allowing me to participate in the care of your patient.Carmina Miller, MD

## 2023-02-19 ENCOUNTER — Inpatient Hospital Stay: Payer: Medicare Other

## 2023-02-19 ENCOUNTER — Encounter: Payer: Self-pay | Admitting: Internal Medicine

## 2023-02-19 ENCOUNTER — Inpatient Hospital Stay (HOSPITAL_BASED_OUTPATIENT_CLINIC_OR_DEPARTMENT_OTHER): Payer: Medicare Other | Admitting: Internal Medicine

## 2023-02-19 VITALS — BP 135/63 | HR 80 | Temp 97.8°F | Ht 74.0 in | Wt 250.2 lb

## 2023-02-19 DIAGNOSIS — C50822 Malignant neoplasm of overlapping sites of left male breast: Secondary | ICD-10-CM | POA: Diagnosis not present

## 2023-02-19 DIAGNOSIS — Z170 Estrogen receptor positive status [ER+]: Secondary | ICD-10-CM

## 2023-02-19 NOTE — Assessment & Plan Note (Addendum)
#    JULY 2023- male breast cancer T2 [4.8cm] N1- micro met; ER strongly positive; PR positive; lymphovascular invasion grade 3. s/p mastectomy- [Dr.Byrnett] Oncotype-recurrence score of 27-DECLINED Chemo.  Currently s/p postmastectomy radiation- [last Fx on 12/12]. 22nd- FEB 2024- [cxr]- CT-bilateral subcentimeter lung nodules concerning for progression of disease. Feb 2024-TAMOXIFEN. JUNE11th 2024-  Waxing/waning of numerous bilateral pulmonary nodules, approximately 15 in number, with index nodules as above. This appearance is considered indeterminate for infection/inflammation versus metastatic disease.  Continue tamoxifen.  # Discussed again concerning findings on the CT scan suggestive of metastatic disease given history of breast cancer.  Nodules are small to consider biopsy at this time. Recommend CT scan in 4 months.   # Bilateral leg cramping-hypokalemia potassium 3.3-on K-Dur once a day[PCP/Lasix]-recommend taking twice a day.  Magnesium normal.   # Left UE lymphedema: s/p  OT evaluation-currently wearing a lymphedema sleeve. S/p  Maureen- OT-unilateral post mastectomy jovipak breast pad.  Stable.   # Rib pain-right-sided question cough causing rib fractures.  Stable.  # Cough- ? COPD/recommend quitting smoking.  Follow-up with PCP-  Stable.  # Bilateral LE swelling [on lasix- Dr.Sonnebegr]; Dr.Etang-stable-continue stockings. Stable.  #Incidental findings on Imaging  CT , June 2024: Moderate hepatic steatosis; Emphysema I reviewed/discussed/counseled the patient.   *DRI- no contrast  # DISPOSITION:  # follow up in 4 months- 2024- - MD; cbc/cmp; mag; CT scan prior-   Dr.B  # I reviewed the blood work- with the patient in detail; also reviewed the imaging independently [as summarized above]; and with the patient in detail.

## 2023-02-19 NOTE — Progress Notes (Signed)
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 Cough- ? COPD/recommend quitting smoking.  Follow-up with PCP-  Stable.  # Bilateral LE swelling [on lasix- Dr.Sonnebegr]; Dr.Etang-stable-continue stockings. Stable.  #Incidental findings on Imaging  CT , June 2024: Moderate hepatic steatosis; Emphysema I  reviewed/discussed/counseled the patient.   *DRI- no contrast  # DISPOSITION:  # follow up in 4 months- 2024- - MD; cbc/cmp; mag; CT scan prior-   Dr.B  # I reviewed the blood work- with the patient in detail; also reviewed the imaging independently [as summarized above]; and with the patient in detail.   All questions were answered. The patient/family knows to call the clinic with any problems, questions or concerns.    Earna Coder, MD 02/19/2023 11:48 AM

## 2023-02-19 NOTE — Progress Notes (Signed)
Pt states you are to order another CT scan for 3 months.

## 2023-02-19 NOTE — Progress Notes (Signed)
Thanks

## 2023-03-05 HISTORY — PX: OTHER SURGICAL HISTORY: SHX169

## 2023-03-23 ENCOUNTER — Other Ambulatory Visit: Payer: Self-pay

## 2023-03-23 ENCOUNTER — Encounter: Payer: Self-pay | Admitting: Family Medicine

## 2023-03-23 ENCOUNTER — Ambulatory Visit (INDEPENDENT_AMBULATORY_CARE_PROVIDER_SITE_OTHER): Payer: Medicare Other | Admitting: Family Medicine

## 2023-03-23 VITALS — BP 126/74 | HR 78 | Temp 98.6°F | Ht 74.0 in | Wt 244.0 lb

## 2023-03-23 DIAGNOSIS — L989 Disorder of the skin and subcutaneous tissue, unspecified: Secondary | ICD-10-CM | POA: Diagnosis not present

## 2023-03-23 DIAGNOSIS — J449 Chronic obstructive pulmonary disease, unspecified: Secondary | ICD-10-CM

## 2023-03-23 NOTE — Assessment & Plan Note (Signed)
Chronic issue. Some improvement with spiriva. He will continue spiriva 2 puffs daily. I will communicate with our pharmacist to find out what the best next step would be given cost concerns.

## 2023-03-23 NOTE — Progress Notes (Signed)
Marikay Alar, MD Phone: [phone]  Benjamin Armstrong is a 67 y.o. male who presents today for f/u.  COPD: Medication compliance- using spiriva  Rescue inhaler use- not daily Dyspnea- improved  Wheezing- occasional  Cough- daily  Productive- white  Skin lesion: notes on right cheek. Has been present 3 months. Getting bigger. Notes skin cancer removed area superior to this in the past.    Social History   Tobacco Use  Smoking Status Every Day   Current packs/day: 1.00   Average packs/day: 1 pack/day for 53.0 years (53.0 ttl pk-yrs)   Types: Cigarettes  Smokeless Tobacco Never    Current Outpatient Medications on File Prior to Visit  Medication Sig Dispense Refill   albuterol (VENTOLIN HFA) 108 (90 Base) MCG/ACT inhaler Inhale 2 puffs into the lungs every 6 (six) hours as needed for wheezing or shortness of breath. 18 each 0   EPINEPHrine 0.3 mg/0.3 mL IJ SOAJ injection Inject 0.3 mg into the muscle as needed for anaphylaxis. 2 each 1   ezetimibe (ZETIA) 10 MG tablet Take 1 tablet (10 mg total) by mouth daily. 90 tablet 2   Famotidine (PEPCID AC PO) Take 1 tablet by mouth as needed.     naproxen sodium (ALEVE) 220 MG tablet Take 220 mg by mouth 2 (two) times daily as needed.     potassium chloride SA (KLOR-CON M20) 20 MEQ tablet Take 1 tablet (20 mEq total) by mouth daily. 30 tablet 3   sildenafil (VIAGRA) 100 MG tablet Take 1 tablet (100 mg total) by mouth daily as needed for erectile dysfunction. 30 tablet 11   tamoxifen (NOLVADEX) 20 MG tablet Take 1 tablet (20 mg total) by mouth daily. DO NOT START UNTIL directed by MD. 90 tablet 1   Tiotropium Bromide Monohydrate (SPIRIVA RESPIMAT) 1.25 MCG/ACT AERS Inhale 2 puffs into the lungs daily. 8 g 0   torsemide (DEMADEX) 20 MG tablet Take 3 tablets (60 mg total) by mouth daily. 90 tablet 2   No current facility-administered medications on file prior to visit.     ROS see history of present illness  Objective  Physical  Exam Vitals:   03/23/23 1346 03/23/23 1409  BP: 132/78 126/74  Pulse: 78   Temp: 98.6 F (37 C)   SpO2: 93%     BP Readings from Last 3 Encounters:  03/23/23 126/74  02/19/23 135/63  02/15/23 132/71   Wt Readings from Last 3 Encounters:  03/23/23 244 lb (110.7 kg)  02/19/23 250 lb 3.2 oz (113.5 kg)  02/15/23 249 lb 14.4 oz (113.4 kg)    Physical Exam Constitutional:      General: He is not in acute distress.    Appearance: He is not diaphoretic.  Cardiovascular:     Rate and Rhythm: Normal rate and regular rhythm.     Heart sounds: Normal heart sounds.  Pulmonary:     Effort: Pulmonary effort is normal.     Breath sounds: Normal breath sounds.  Skin:    General: Skin is warm and dry.  Neurological:     Mental Status: He is alert.      Assessment/Plan: Please see individual problem list.  Chronic obstructive pulmonary disease, unspecified COPD type (HCC) Assessment & Plan: Chronic issue. Some improvement with spiriva. He will continue spiriva 2 puffs daily. I will communicate with our pharmacist to find out what the best next step would be given cost concerns.    Skin lesion Assessment & Plan: Chronic issue. Concerning for skin  cancer vs precancerous lesion. Refer to dermatology. Advised to contact us in 1-2 weeks if they have not heard anything.   Orders: -     Ambulatory referral to Dermatology     Return in about 3 months (around 06/23/2023).   Marikay Alar, MD [facility]

## 2023-03-23 NOTE — Assessment & Plan Note (Signed)
Chronic issue. Concerning for skin cancer vs precancerous lesion. Refer to dermatology. Advised to contact us in 1-2 weeks if they have not heard anything.

## 2023-03-26 ENCOUNTER — Other Ambulatory Visit: Payer: Self-pay | Admitting: Internal Medicine

## 2023-03-26 ENCOUNTER — Encounter: Payer: Self-pay | Admitting: Family Medicine

## 2023-03-26 DIAGNOSIS — B351 Tinea unguium: Secondary | ICD-10-CM

## 2023-03-26 MED ORDER — TAMOXIFEN CITRATE 20 MG PO TABS: 20.0000 mg | ORAL_TABLET | Freq: Every day | ORAL | 1 refills | Status: DC

## 2023-03-26 NOTE — Progress Notes (Signed)
I called in script per sister's request- GB

## 2023-03-29 ENCOUNTER — Encounter: Payer: Self-pay | Admitting: Dermatology

## 2023-03-29 ENCOUNTER — Ambulatory Visit (INDEPENDENT_AMBULATORY_CARE_PROVIDER_SITE_OTHER): Payer: Medicare Other | Admitting: Dermatology

## 2023-03-29 DIAGNOSIS — D485 Neoplasm of uncertain behavior of skin: Secondary | ICD-10-CM

## 2023-03-29 DIAGNOSIS — W908XXA Exposure to other nonionizing radiation, initial encounter: Secondary | ICD-10-CM | POA: Diagnosis not present

## 2023-03-29 DIAGNOSIS — B079 Viral wart, unspecified: Secondary | ICD-10-CM | POA: Diagnosis not present

## 2023-03-29 DIAGNOSIS — L570 Actinic keratosis: Secondary | ICD-10-CM | POA: Diagnosis not present

## 2023-03-29 DIAGNOSIS — D099 Carcinoma in situ, unspecified: Secondary | ICD-10-CM

## 2023-03-29 DIAGNOSIS — L820 Inflamed seborrheic keratosis: Secondary | ICD-10-CM

## 2023-03-29 HISTORY — DX: Carcinoma in situ, unspecified: D09.9

## 2023-03-29 NOTE — Progress Notes (Signed)
New Patient Visit   Subjective  Benjamin Armstrong is a 67 y.o. male who presents for the following: spot at right mandible, present for about 2 months. It does itch and is tender to touch, will bleed when he shaves. Also a spot at right hand.  Patient with hx of skin cancer at right cheek treated in 1989.  Patient accompanied by sister, June, who contributes to history.  The following portions of the chart were reviewed this encounter and updated as appropriate: medications, allergies, medical history  Review of Systems:  No other skin or systemic complaints except as noted in HPI or Assessment and Plan.  Objective  Well appearing patient in no apparent distress; mood and affect are within normal limits.   A focused examination was performed of the following areas: Face, arms, hands  Relevant exam findings are noted in the Assessment and Plan.  right cheek Pink inflammatory papule 2 mm     left extensor forearm x 3, right forearm x 2 (5) Erythematous thin papules/macules with gritty scale.     Assessment & Plan     Neoplasm of uncertain behavior of skin right cheek  Skin / nail biopsy Type of biopsy: tangential   Informed consent: discussed and consent obtained   Timeout: patient name, date of birth, surgical site, and procedure verified   Procedure prep:  Patient was prepped and draped in usual sterile fashion Prep type:  Isopropyl alcohol Anesthesia: the lesion was anesthetized in a standard fashion   Anesthetic:  1% lidocaine w/ epinephrine 1-100,000 buffered w/ 8.4% NaHCO3 Instrument used: DermaBlade   Hemostasis achieved with: pressure and aluminum chloride   Outcome: patient tolerated procedure well   Post-procedure details: sterile dressing applied and wound care instructions given   Dressing type: bandage and petrolatum    Specimen 1 - Surgical pathology Differential Diagnosis: pseudo folliculitis vs BCC vs prurigo nodule  Check Margins: No Pink  inflammatory papule 2 mm  AK (actinic keratosis) (5) left extensor forearm x 3, right forearm x 2  Actinic keratoses are precancerous spots that appear secondary to cumulative UV radiation exposure/sun exposure over time. They are chronic with expected duration over 1 year. A portion of actinic keratoses will progress to squamous cell carcinoma of the skin. It is not possible to reliably predict which spots will progress to skin cancer and so treatment is recommended to prevent development of skin cancer.  Recommend daily broad spectrum sunscreen SPF 30+ to sun-exposed areas, reapply every 2 hours as needed.  Recommend staying in the shade or wearing long sleeves, sun glasses (UVA+UVB protection) and wide brim hats (4-inch brim around the entire circumference of the hat). Call for new or changing lesions.   Destruction of lesion - left extensor forearm x 3, right forearm x 2 (5)  Destruction method: cryotherapy   Informed consent: discussed and consent obtained   Lesion destroyed using liquid nitrogen: Yes   Cryotherapy cycles:  2 Outcome: patient tolerated procedure well with no complications   Post-procedure details: wound care instructions given    Inflamed seborrheic keratosis  WART VS ISK Exam: verrucous papule(s)  Counseling Discussed viral / HPV (Human Papilloma Virus) etiology and risk of spread /infectivity to other areas of body as well as to other people.  Multiple treatments and methods may be required to clear warts and it is possible treatment may not be successful.  Treatment risks include discoloration; scarring and there is still potential for wart recurrence.  Treatment Plan: Destruction Procedure  Note Destruction method: cryotherapy   Informed consent: discussed and consent obtained   Lesion destroyed using liquid nitrogen: Yes   Outcome: patient tolerated procedure well with no complications   Post-procedure details: wound care instructions given   Locations:  right dorsal wrist # of Lesions Treated: 1  Prior to procedure, discussed risks of blister formation, small wound, skin dyspigmentation, or rare scar following cryotherapy. Recommend Vaseline ointment to treated areas while healing.    Return if symptoms worsen or fail to improve.  Anise Salvo, RMA, am acting as scribe for Elie Goody, MD .   Documentation: I have reviewed the above documentation for accuracy and completeness, and I agree with the above.  Elie Goody, MD

## 2023-03-29 NOTE — Patient Instructions (Addendum)
Cryotherapy Aftercare  Wash gently with soap and water everyday.   Apply Vaseline and Band-Aid daily until healed.   Wound Care Instructions  Cleanse wound gently with soap and water once a day then pat dry with clean gauze. Apply a thin coat of Petrolatum (petroleum jelly, "Vaseline") over the wound (unless you have an allergy to this). We recommend that you use a new, sterile tube of Vaseline. Do not pick or remove scabs. Do not remove the yellow or white "healing tissue" from the base of the wound.  Cover the wound with fresh, clean, nonstick gauze and secure with paper tape. You may use Band-Aids in place of gauze and tape if the wound is small enough, but would recommend trimming much of the tape off as there is often too much. Sometimes Band-Aids can irritate the skin.  You should call the office for your biopsy report after 1 week if you have not already been contacted.  If you experience any problems, such as abnormal amounts of bleeding, swelling, significant bruising, significant pain, or evidence of infection, please call the office immediately.  FOR ADULT SURGERY PATIENTS: If you need something for pain relief you may take 1 extra strength Tylenol (acetaminophen) AND 2 Ibuprofen (200mg each) together every 4 hours as needed for pain. (do not take these if you are allergic to them or if you have a reason you should not take them.) Typically, you may only need pain medication for 1 to 3 days.     Due to recent changes in healthcare laws, you may see results of your pathology and/or laboratory studies on MyChart before the doctors have had a chance to review them. We understand that in some cases there may be results that are confusing or concerning to you. Please understand that not all results are received at the same time and often the doctors may need to interpret multiple results in order to provide you with the best plan of care or course of treatment. Therefore, we ask that you  please give us 2 business days to thoroughly review all your results before contacting the office for clarification. Should we see a critical lab result, you will be contacted sooner.   If You Need Anything After Your Visit  If you have any questions or concerns for your doctor, please call our main line at [phone] and press option 4 to reach your doctor's medical assistant. If no one answers, please leave a voicemail as directed and we will return your call as soon as possible. Messages left after 4 pm will be answered the following business day.   You may also send us a message via MyChart. We typically respond to MyChart messages within 1-2 business days.  For prescription refills, please ask your pharmacy to contact our office. Our fax number is [phone].  If you have an urgent issue when the clinic is closed that cannot wait until the next business day, you can page your doctor at the number below.    Please note that while we do our best to be available for urgent issues outside of office hours, we are not available 24/7.   If you have an urgent issue and are unable to reach us, you may choose to seek medical care at your doctor's office, retail clinic, urgent care center, or emergency room.  If you have a medical emergency, please immediately call 911 or go to the emergency department.  Pager Numbers  - Dr. Kowalski: [phone]  -   Dr. Moye: [phone]  - Dr. Stewart: [phone]  In the event of inclement weather, please call our main line at [phone] for an update on the status of any delays or closures.  Dermatology Medication Tips: Please keep the boxes that topical medications come in in order to help keep track of the instructions about where and how to use these. Pharmacies typically print the medication instructions only on the boxes and not directly on the medication tubes.   If your medication is too expensive, please contact our office at  [phone] option 4 or send us a message through MyChart.   We are unable to tell what your co-pay for medications will be in advance as this is different depending on your insurance coverage. However, we may be able to find a substitute medication at lower cost or fill out paperwork to get insurance to cover a needed medication.   If a prior authorization is required to get your medication covered by your insurance company, please allow us 1-2 business days to complete this process.  Drug prices often vary depending on where the prescription is filled and some pharmacies may offer cheaper prices.  The website www.goodrx.com contains coupons for medications through different pharmacies. The prices here do not account for what the cost may be with help from insurance (it may be cheaper with your insurance), but the website can give you the price if you did not use any insurance.  - You can print the associated coupon and take it with your prescription to the pharmacy.  - You may also stop by our office during regular business hours and pick up a GoodRx coupon card.  - If you need your prescription sent electronically to a different pharmacy, notify our office through [facility] MyChart or by phone at [phone] option 4.   

## 2023-03-30 ENCOUNTER — Other Ambulatory Visit: Payer: Self-pay | Admitting: Pharmacist

## 2023-03-30 MED ORDER — STIOLTO RESPIMAT 2.5-2.5 MCG/ACT IN AERS: 2.0000 | INHALATION_SPRAY | Freq: Every day | RESPIRATORY_TRACT | Status: DC

## 2023-03-30 NOTE — Progress Notes (Signed)
Care Coordination Call  Received message from PCP that he is interested in next step for COPD, continued shortness of breath with Spiriva. Recommend we apply for assistance for Stiolto (LABA/LAMA).   Will collaborate with tech team to send updated script/application to [employer] to pursue Stiolto assistance.   Catie Eppie Gibson, PharmD, BCACP, CPP Clinical Pharmacist [facility] [phone]

## 2023-04-05 ENCOUNTER — Telehealth: Payer: Self-pay | Admitting: Pharmacy Technician

## 2023-04-05 DIAGNOSIS — Z5986 Financial insecurity: Secondary | ICD-10-CM

## 2023-04-05 NOTE — Progress Notes (Signed)
[facility] Customer service manager [facility])                                            [facility] Quality Pharmacy Team    04/05/2023  Benjamin Armstrong February 15, 1956 [id number]                                      Medication Assistance Referral  Referral From:  [facility] PharmD Catie Clearance Coots  Medication/Company: Benjamin Armstrong / [employer] Patient application portion:  N/A patient signed in clinic Provider application portion: Faxed  to Dr. Marikay Alar Provider address/fax verified via: Office website    [name], CPhT [place]  [facility] Office: [phone] Fax: [phone] Email: [email or web address]

## 2023-04-09 ENCOUNTER — Telehealth: Payer: Self-pay

## 2023-04-09 DIAGNOSIS — D0439 Carcinoma in situ of skin of other parts of face: Secondary | ICD-10-CM

## 2023-04-09 NOTE — Telephone Encounter (Signed)
Patient advised biopsy results showed SCC. Reviewed treatment options recommended by Dr. Katrinka Blazing with patient. He will discuss with his sister and let us know tomorrow with his choice of treatment. Butch Penny., RMA

## 2023-04-09 NOTE — Telephone Encounter (Signed)
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   Explanation: This is a squamous cell skin cancer limited to the top layer of skin. This means it is an early cancer and has not spread. However, it has the potential to spread beyond the skin and threaten your health, so we recommend treating it.   Treatment option 1: a cream (fluorouracil and calcipotriene) that helps your immune system clear the skin cancer. It will cause redness and irritation. Wait two weeks after the biopsy to start applying the cream. Apply the cream twice per day until the redness and irritation develop (usually occurs by day 7), then stop and allow it to heal. We will recheck the area in 2 months to ensure the cancer is gone. The cream is $35 plus shipping and will be mailed to you from a low cost compounding pharmacy.   Treatment option 2: Mohs surgery, which involves cutting out right around the skin cancer and then checking under the microscope on the same day to ensure the whole skin cancer is out. If there is more cancer remaining, the surgeon will repeat the process until it is fully cleared. The cure rate is about 98-99%. It is done at another office outside of [place] ([place], [place], or [place]). Once the Mohs surgeon confirms the skin cancer is out, they will discuss the options to repair or heal the area. You must take it easy for about two weeks after surgery (no lifting over 10-15 lbs, avoid activity to get your heart rate and blood pressure up).

## 2023-04-10 ENCOUNTER — Ambulatory Visit: Payer: Medicare Other | Admitting: *Deleted

## 2023-04-10 VITALS — BP 112/68 | HR 82 | Ht 74.0 in | Wt 244.0 lb

## 2023-04-10 DIAGNOSIS — Z0000 Encounter for general adult medical examination without abnormal findings: Secondary | ICD-10-CM | POA: Diagnosis not present

## 2023-04-10 NOTE — Patient Instructions (Signed)
Mr. Bounds , Thank you for taking time to come for your Medicare Wellness Visit. I appreciate your ongoing commitment to your health goals. Please review the following plan we discussed and let me know if I can assist you in the future.   Referrals/Orders/Follow-Ups/Clinician Recommendations: None  This is a list of the screening recommended for you and due dates:  Health Maintenance  Topic Date Due   Pneumonia Vaccine (1 of 2 - PCV) Never done   Complete foot exam   Never done   Yearly kidney health urinalysis for diabetes  Never done   DTaP/Tdap/Td vaccine (1 - Tdap) Never done   Eye exam for diabetics  03/29/2023   Flu Shot  04/05/2023   Cologuard (Stool DNA test)  05/26/2023   Hemoglobin A1C  06/23/2023   Screening for Lung Cancer  02/01/2024   Yearly kidney function blood test for diabetes  02/15/2024   Medicare Annual Wellness Visit  04/09/2024   Hepatitis C Screening  Completed   HPV Vaccine  Aged Out   COVID-19 Vaccine  Discontinued   Zoster (Shingles) Vaccine  Discontinued    Advanced directives: (Declined) Advance directive discussed with you today. Even though you declined this today, please call our office should you change your mind, and we can give you the proper paperwork for you to fill out.  Next Medicare Annual Wellness Visit scheduled for next year: Yes 04/14/24 @ 1:15  Preventive Care 69 Years and Older, Male  Preventive care refers to lifestyle choices and visits with your health care provider that can promote health and wellness. What does preventive care include? A yearly physical exam. This is also called an annual well check. Dental exams once or twice a year. Routine eye exams. Ask your health care provider how often you should have your eyes checked. Personal lifestyle choices, including: Daily care of your teeth and gums. Regular physical activity. Eating a healthy diet. Avoiding tobacco and drug use. Limiting alcohol use. Practicing safe  sex. Taking low doses of aspirin every day. Taking vitamin and mineral supplements as recommended by your health care provider. What happens during an annual well check? The services and screenings done by your health care provider during your annual well check will depend on your age, overall health, lifestyle risk factors, and family history of disease. Counseling  Your health care provider may ask you questions about your: Alcohol use. Tobacco use. Drug use. Emotional well-being. Home and relationship well-being. Sexual activity. Eating habits. History of falls. Memory and ability to understand (cognition). Work and work Astronomer. Screening  You may have the following tests or measurements: Height, weight, and BMI. Blood pressure. Lipid and cholesterol levels. These may be checked every 5 years, or more frequently if you are over 25 years old. Skin check. Lung cancer screening. You may have this screening every year starting at age 69 if you have a 30-pack-year history of smoking and currently smoke or have quit within the past 15 years. Fecal occult blood test (FOBT) of the stool. You may have this test every year starting at age 50. Flexible sigmoidoscopy or colonoscopy. You may have a sigmoidoscopy every 5 years or a colonoscopy every 10 years starting at age 22. Prostate cancer screening. Recommendations will vary depending on your family history and other risks. Hepatitis C blood test. Hepatitis B blood test. Sexually transmitted disease (STD) testing. Diabetes screening. This is done by checking your blood sugar (glucose) after you have not eaten for a while (fasting).  You may have this done every 1-3 years. Abdominal aortic aneurysm (AAA) screening. You may need this if you are a current or former smoker. Osteoporosis. You may be screened starting at age 59 if you are at high risk. Talk with your health care provider about your test results, treatment options, and if  necessary, the need for more tests. Vaccines  Your health care provider may recommend certain vaccines, such as: Influenza vaccine. This is recommended every year. Tetanus, diphtheria, and acellular pertussis (Tdap, Td) vaccine. You may need a Td booster every 10 years. Zoster vaccine. You may need this after age 41. Pneumococcal 13-valent conjugate (PCV13) vaccine. One dose is recommended after age 76. Pneumococcal polysaccharide (PPSV23) vaccine. One dose is recommended after age 25. Talk to your health care provider about which screenings and vaccines you need and how often you need them. This information is not intended to replace advice given to you by your health care provider. Make sure you discuss any questions you have with your health care provider. Document Released: 09/17/2015 Document Revised: 05/10/2016 Document Reviewed: 06/22/2015 Elsevier Interactive Patient Education  2017 [employer].  Fall Prevention in the Home Falls can cause injuries. They can happen to people of all ages. There are many things you can do to make your home safe and to help prevent falls. What can I do on the outside of my home? Regularly fix the edges of walkways and driveways and fix any cracks. Remove anything that might make you trip as you walk through a door, such as a raised step or threshold. Trim any bushes or trees on the path to your home. Use bright outdoor lighting. Clear any walking paths of anything that might make someone trip, such as rocks or tools. Regularly check to see if handrails are loose or broken. Make sure that both sides of any steps have handrails. Any raised decks and porches should have guardrails on the edges. Have any leaves, snow, or ice cleared regularly. Use sand or salt on walking paths during winter. Clean up any spills in your garage right away. This includes oil or grease spills. What can I do in the bathroom? Use night lights. Install grab bars by the toilet  and in the tub and shower. Do not use towel bars as grab bars. Use non-skid mats or decals in the tub or shower. If you need to sit down in the shower, use a plastic, non-slip stool. Keep the floor dry. Clean up any water that spills on the floor as soon as it happens. Remove soap buildup in the tub or shower regularly. Attach bath mats securely with double-sided non-slip rug tape. Do not have throw rugs and other things on the floor that can make you trip. What can I do in the bedroom? Use night lights. Make sure that you have a light by your bed that is easy to reach. Do not use any sheets or blankets that are too big for your bed. They should not hang down onto the floor. Have a firm chair that has side arms. You can use this for support while you get dressed. Do not have throw rugs and other things on the floor that can make you trip. What can I do in the kitchen? Clean up any spills right away. Avoid walking on wet floors. Keep items that you use a lot in easy-to-reach places. If you need to reach something above you, use a strong step stool that has a grab bar. Keep  electrical cords out of the way. Do not use floor polish or wax that makes floors slippery. If you must use wax, use non-skid floor wax. Do not have throw rugs and other things on the floor that can make you trip. What can I do with my stairs? Do not leave any items on the stairs. Make sure that there are handrails on both sides of the stairs and use them. Fix handrails that are broken or loose. Make sure that handrails are as long as the stairways. Check any carpeting to make sure that it is firmly attached to the stairs. Fix any carpet that is loose or worn. Avoid having throw rugs at the top or bottom of the stairs. If you do have throw rugs, attach them to the floor with carpet tape. Make sure that you have a light switch at the top of the stairs and the bottom of the stairs. If you do not have them, ask someone to add  them for you. What else can I do to help prevent falls? Wear shoes that: Do not have high heels. Have rubber bottoms. Are comfortable and fit you well. Are closed at the toe. Do not wear sandals. If you use a stepladder: Make sure that it is fully opened. Do not climb a closed stepladder. Make sure that both sides of the stepladder are locked into place. Ask someone to hold it for you, if possible. Clearly mark and make sure that you can see: Any grab bars or handrails. First and last steps. Where the edge of each step is. Use tools that help you move around (mobility aids) if they are needed. These include: Canes. Walkers. Scooters. Crutches. Turn on the lights when you go into a dark area. Replace any light bulbs as soon as they burn out. Set up your furniture so you have a clear path. Avoid moving your furniture around. If any of your floors are uneven, fix them. If there are any pets around you, be aware of where they are. Review your medicines with your doctor. Some medicines can make you feel dizzy. This can increase your chance of falling. Ask your doctor what other things that you can do to help prevent falls. This information is not intended to replace advice given to you by your health care provider. Make sure you discuss any questions you have with your health care provider. Document Released: 06/17/2009 Document Revised: 01/27/2016 Document Reviewed: 09/25/2014 Elsevier Interactive Patient Education  2017 [employer].
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
## 2023-04-11 NOTE — Telephone Encounter (Signed)
Patient's sister advised of BX results and would like referral to [facility] at [facility],. aw

## 2023-04-12 ENCOUNTER — Ambulatory Visit (INDEPENDENT_AMBULATORY_CARE_PROVIDER_SITE_OTHER): Payer: Medicare Other | Admitting: Podiatry

## 2023-04-12 ENCOUNTER — Encounter: Payer: Self-pay | Admitting: Podiatry

## 2023-04-12 DIAGNOSIS — B351 Tinea unguium: Secondary | ICD-10-CM

## 2023-04-12 DIAGNOSIS — E119 Type 2 diabetes mellitus without complications: Secondary | ICD-10-CM | POA: Diagnosis not present

## 2023-04-12 DIAGNOSIS — M79675 Pain in left toe(s): Secondary | ICD-10-CM | POA: Diagnosis not present

## 2023-04-12 DIAGNOSIS — T148XXA Other injury of unspecified body region, initial encounter: Secondary | ICD-10-CM

## 2023-04-12 DIAGNOSIS — M79674 Pain in right toe(s): Secondary | ICD-10-CM

## 2023-04-12 DIAGNOSIS — R600 Localized edema: Secondary | ICD-10-CM

## 2023-04-12 NOTE — Patient Instructions (Signed)
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
## 2023-04-13 ENCOUNTER — Telehealth: Payer: Self-pay | Admitting: Pharmacy Technician

## 2023-04-13 DIAGNOSIS — Z5986 Financial insecurity: Secondary | ICD-10-CM

## 2023-04-13 NOTE — Progress Notes (Signed)
[facility])                                            [facility] Quality Pharmacy Team    04/13/2023  Roi Hjort 07/27/1956 [id number]  Received both patient and provider portion(s) of patient assistance application(s) for Stiolto. Faxed completed application and required documents into BI.    [name], CPhT [place]  [facility] Office: [phone] Fax: [phone] Email: [email or web address]

## 2023-04-17 ENCOUNTER — Encounter: Payer: Self-pay | Admitting: Family Medicine

## 2023-04-17 ENCOUNTER — Ambulatory Visit: Payer: Medicare Other | Attending: Internal Medicine | Admitting: Occupational Therapy

## 2023-04-17 DIAGNOSIS — M6281 Muscle weakness (generalized): Secondary | ICD-10-CM | POA: Insufficient documentation

## 2023-04-17 DIAGNOSIS — G5621 Lesion of ulnar nerve, right upper limb: Secondary | ICD-10-CM

## 2023-04-17 DIAGNOSIS — R200 Anesthesia of skin: Secondary | ICD-10-CM
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
## 2023-04-18 ENCOUNTER — Telehealth: Payer: Self-pay | Admitting: Pharmacy Technician

## 2023-04-18 DIAGNOSIS — Z5986 Financial insecurity: Secondary | ICD-10-CM

## 2023-04-18 NOTE — Progress Notes (Signed)
[facility])                                            [facility] Quality Pharmacy Team    04/18/2023  Benjamin Armstrong 04-Oct-1955 [id number]  Care coordination call placed to BI in regard to [name] Respimat application.  Spoke to [name] who informs the application is APPROVED 8/92024-09/04/2023.   [name] informs initial shipment will be processed automatically and mailed out to patient's home. She informs all subsequent refills will need to be called in by the patient to [facility] by dialing [phone].  [name], CPhT [place]  [facility] Office: [phone] Fax: [phone] Email: [email or web address]

## 2023-04-18 NOTE — Progress Notes (Signed)
Erroneous encounter.  [name], CPhT [place]  [facility] Office: [phone] Fax: [phone] Email: [email or web address]

## 2023-04-20 ENCOUNTER — Telehealth: Payer: Self-pay

## 2023-04-20 NOTE — Telephone Encounter (Signed)
Spoke to pt to clarify he is taking Stiolto pt stated he was expecting medication to be sent out. Call [employer] to verify the medication changed. They stated they will inform pt and set up a scheduled delivery for [facility]

## 2023-04-20 NOTE — Telephone Encounter (Signed)
If he can get Stiolto he should no longer take the Spiriva.

## 2023-04-20 NOTE — Telephone Encounter (Signed)
Received the attached fax notification.  Please clarify inhalers.  Looks like Spiriva is  still active on pt's med list but Catie Clearance Coots also ordered [employer] 03/30/23.

## 2023-05-04 DIAGNOSIS — G5621 Lesion of ulnar nerve, right upper limb: Secondary | ICD-10-CM | POA: Insufficient documentation

## 2023-05-09 ENCOUNTER — Ambulatory Visit: Payer: Medicare Other | Attending: Cardiology | Admitting: Cardiology

## 2023-05-09 ENCOUNTER — Encounter: Payer: Self-pay | Admitting: Cardiology

## 2023-05-09 ENCOUNTER — Other Ambulatory Visit: Payer: Self-pay | Admitting: Cardiology

## 2023-05-09 VITALS — BP 142/82 | HR 79 | Wt 245.0 lb

## 2023-05-09 DIAGNOSIS — R600 Localized edema: Secondary | ICD-10-CM | POA: Insufficient documentation

## 2023-05-09 DIAGNOSIS — E782 Mixed hyperlipidemia: Secondary | ICD-10-CM | POA: Insufficient documentation

## 2023-05-09 DIAGNOSIS — Z720 Tobacco use: Secondary | ICD-10-CM | POA: Insufficient documentation

## 2023-05-09 MED ORDER — POTASSIUM CHLORIDE CRYS ER 20 MEQ PO TBCR: 30.0000 meq | EXTENDED_RELEASE_TABLET | Freq: Every day | ORAL | 3 refills | Status: DC
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
## 2023-05-09 NOTE — Patient Instructions (Signed)
Medication Instructions:   INCREASE Potassium - Take one and a half tablets (30 meq) by mouth daily.   *If you need a refill on your cardiac medications before your next appointment, please call your pharmacy*   Lab Work:  None Ordered  If you have labs (blood work) drawn today and your tests are completely normal, you will receive your results only by: MyChart Message (if you have MyChart) OR A paper copy in the mail If you have any lab test that is abnormal or we need to change your treatment, we will call you to review the results.   Testing/Procedures:  None Ordered    Follow-Up: At [facility], you and your health needs are our priority.  As part of our continuing mission to provide you with exceptional heart care, we have created designated Provider Care Teams.  These Care Teams include your primary Cardiologist (physician) and Advanced Practice Providers (APPs -  Physician Assistants and Nurse Practitioners) who all work together to provide you with the care you need, when you need it.  We recommend signing up for the patient portal called "MyChart".  Sign up information is provided on this After Visit Summary.  MyChart is used to connect with patients for Virtual Visits (Telemedicine).  Patients are able to view lab/test results, encounter notes, upcoming appointments, etc.  Non-urgent messages can be sent to your provider as well.   To learn more about what you can do with MyChart, go to [email or web address].    Your next appointment:   6 month(s)  Provider:   You may see Debbe Odea, MD or one of the following Advanced Practice Providers on your designated Care Team:   Nicolasa Ducking, NP Eula Listen, PA-C Cadence Fransico Michael, PA-C Charlsie Quest, NP

## 2023-05-10 ENCOUNTER — Ambulatory Visit (INDEPENDENT_AMBULATORY_CARE_PROVIDER_SITE_OTHER): Payer: Medicare Other | Admitting: Dermatology

## 2023-05-10 ENCOUNTER — Encounter: Payer: Self-pay | Admitting: Dermatology

## 2023-05-10 DIAGNOSIS — L820 Inflamed seborrheic keratosis: Secondary | ICD-10-CM

## 2023-05-10 DIAGNOSIS — L814 Other melanin hyperpigmentation: Secondary | ICD-10-CM | POA: Diagnosis not present

## 2023-05-10 DIAGNOSIS — D239 Other benign neoplasm of skin, unspecified: Secondary | ICD-10-CM

## 2023-05-10 DIAGNOSIS — Z86007 Personal history of in-situ neoplasm of skin: Secondary | ICD-10-CM

## 2023-05-10 DIAGNOSIS — L578 Other skin changes due to chronic exposure to nonionizing radiation: Secondary | ICD-10-CM | POA: Diagnosis not present

## 2023-05-10 DIAGNOSIS — D1801 Hemangioma of skin and subcutaneous tissue: Secondary | ICD-10-CM

## 2023-05-10 DIAGNOSIS — L570 Actinic keratosis: Secondary | ICD-10-CM | POA: Diagnosis not present

## 2023-05-10 DIAGNOSIS — D492 Neoplasm of unspecified behavior of bone, soft tissue, and skin: Secondary | ICD-10-CM

## 2023-05-10 DIAGNOSIS — W908XXA Exposure to other nonionizing radiation, initial encounter: Secondary | ICD-10-CM | POA: Diagnosis not present

## 2023-05-10 DIAGNOSIS — D225 Melanocytic nevi of trunk: Secondary | ICD-10-CM | POA: Diagnosis not present

## 2023-05-10 DIAGNOSIS — Z1283 Encounter for screening for malignant neoplasm of skin: Secondary | ICD-10-CM | POA: Diagnosis not present

## 2023-05-10 DIAGNOSIS — L821 Other seborrheic keratosis: Secondary | ICD-10-CM

## 2023-05-10 HISTORY — DX: Other benign neoplasm of skin, unspecified: D23.9

## 2023-05-10 NOTE — Progress Notes (Signed)
Follow-Up Visit   Subjective  Benjamin Armstrong is a 67 y.o. male who presents for the following: Skin Cancer Screening and Full Body Skin Exam. Hx of SCCis, R cheek. Mohs performed 05/02/2023.  The patient presents for Total-Body Skin Exam (TBSE) for skin cancer screening and mole check. The patient has spots, moles and lesions to be evaluated, some may be new or changing and the patient may have concern these could be cancer.    The following portions of the chart were reviewed this encounter and updated as appropriate: medications, allergies, medical history  Review of Systems:  No other skin or systemic complaints except as noted in HPI or Assessment and Plan.  Objective  Well appearing patient in no apparent distress; mood and affect are within normal limits.  A full examination was performed including scalp, head, eyes, ears, nose, lips, neck, chest, axillae, abdomen, back, buttocks, bilateral upper extremities, bilateral lower extremities, hands, feet, fingers, toes, fingernails, and toenails. All findings within normal limits unless otherwise noted below.   Relevant physical exam findings are noted in the Assessment and Plan.  Right Temple x1, R dorsal wrist x1, L mid anterior forearm x1 (3) Erythematous thin papules/macules with gritty scale.   Right Thigh - Anterior x1 Erythematous keratotic or waxy stuck-on papule or plaque.  Right mid abdomen 9 mm multicolored pigmented macule with pink area           Assessment & Plan   HISTORY OF SQUAMOUS CELL CARCINOMA IN SITU OF THE SKIN. Right cheek, Mohs 05/02/2023  - No evidence of recurrence today - Recommend regular full body skin exams - Recommend daily broad spectrum sunscreen SPF 30+ to sun-exposed areas, reapply every 2 hours as needed.  - Call if any new or changing lesions are noted between office visits   SKIN CANCER SCREENING PERFORMED TODAY.  ACTINIC DAMAGE - Chronic condition, secondary to cumulative  UV/sun exposure - diffuse scaly erythematous macules with underlying dyspigmentation - Recommend daily broad spectrum sunscreen SPF 30+ to sun-exposed areas, reapply every 2 hours as needed.  - Staying in the shade or wearing long sleeves, sun glasses (UVA+UVB protection) and wide brim hats (4-inch brim around the entire circumference of the hat) are also recommended for sun protection.  - Call for new or changing lesions.  LENTIGINES, SEBORRHEIC KERATOSES, HEMANGIOMAS - Benign normal skin lesions - Benign-appearing - Call for any changes  MELANOCYTIC NEVI - Tan-brown and/or pink-flesh-colored symmetric macules and papules - Benign appearing on exam today - Observation - Call clinic for new or changing moles - Recommend daily use of broad spectrum spf 30+ sunscreen to sun-exposed areas.       AK (actinic keratosis) (3) Right Temple x1, R dorsal wrist x1, L mid anterior forearm x1  Actinic keratoses are precancerous spots that appear secondary to cumulative UV radiation exposure/sun exposure over time. They are chronic with expected duration over 1 year. A portion of actinic keratoses will progress to squamous cell carcinoma of the skin. It is not possible to reliably predict which spots will progress to skin cancer and so treatment is recommended to prevent development of skin cancer.  Recommend daily broad spectrum sunscreen SPF 30+ to sun-exposed areas, reapply every 2 hours as needed.  Recommend staying in the shade or wearing long sleeves, sun glasses (UVA+UVB protection) and wide brim hats (4-inch brim around the entire circumference of the hat). Call for new or changing lesions.  Destruction of lesion - Right Temple x1, R dorsal wrist  x1, L mid anterior forearm x1 (3)  Destruction method: cryotherapy   Informed consent: discussed and consent obtained   Lesion destroyed using liquid nitrogen: Yes   Region frozen until ice ball extended beyond lesion: Yes   Outcome: patient  tolerated procedure well with no complications   Post-procedure details: wound care instructions given   Additional details:  Prior to procedure, discussed risks of blister formation, small wound, skin dyspigmentation, or rare scar following cryotherapy. Recommend Vaseline ointment to treated areas while healing.   Inflamed seborrheic keratosis Right Thigh - Anterior x1  Symptomatic, irritating, patient would like treated.  Destruction of lesion - Right Thigh - Anterior x1  Destruction method: cryotherapy   Informed consent: discussed and consent obtained   Lesion destroyed using liquid nitrogen: Yes   Region frozen until ice ball extended beyond lesion: Yes   Outcome: patient tolerated procedure well with no complications   Post-procedure details: wound care instructions given   Additional details:  Prior to procedure, discussed risks of blister formation, small wound, skin dyspigmentation, or rare scar following cryotherapy. Recommend Vaseline ointment to treated areas while healing.   Neoplasm of skin Right mid abdomen  Skin / nail biopsy Type of biopsy: tangential   Informed consent: discussed and consent obtained   Anesthesia: the lesion was anesthetized in a standard fashion   Anesthesia comment:  Area prepped with alcohol Anesthetic:  1% lidocaine w/ epinephrine 1-100,000 buffered w/ 8.4% NaHCO3 Instrument used: flexible razor blade   Hemostasis achieved with: pressure, aluminum chloride and electrodesiccation   Outcome: patient tolerated procedure well   Post-procedure details: wound care instructions given   Post-procedure details comment:  Ointment and small bandage applied  Specimen 1 - Surgical pathology Differential Diagnosis: Dysplastic nevus vs melanoma  Check Margins: No   Return in about 6 months (around 11/07/2023) for AK Follow Up, TBSE, HxSCC.  I, Lawson Radar, CMA, am acting as scribe for Elie Goody, MD.   Documentation: I have reviewed the  above documentation for accuracy and completeness, and I agree with the above.  Elie Goody, MD

## 2023-05-10 NOTE — Patient Instructions (Addendum)
 Cryotherapy Aftercare  Wash gently with soap and water everyday.   Apply Vaseline Jelly daily until healed.    Wound Care Instructions  Cleanse wound gently with soap and water once a day then pat dry with clean gauze. Apply a thin coat of Petrolatum (petroleum jelly, "Vaseline") over the wound (unless you have an allergy to this). We recommend that you use a new, sterile tube of Vaseline. Do not pick or remove scabs. Do not remove the yellow or white "healing tissue" from the base of the wound.  Cover the wound with fresh, clean, nonstick gauze and secure with paper tape. You may use Band-Aids in place of gauze and tape if the wound is small enough, but would recommend trimming much of the tape off as there is often too much. Sometimes Band-Aids can irritate the skin.  You should call the office for your biopsy report after 1 week if you have not already been contacted.  If you experience any problems, such as abnormal amounts of bleeding, swelling, significant bruising, significant pain, or evidence of infection, please call the office immediately.  FOR ADULT SURGERY PATIENTS: If you need something for pain relief you may take 1 extra strength Tylenol (acetaminophen) AND 2 Ibuprofen (200mg  each) together every 4 hours as needed for pain. (do not take these if you are allergic to them or if you have a reason you should not take them.) Typically, you may only need pain medication for 1 to 3 days.      Recommend daily broad spectrum sunscreen SPF 30+ to sun-exposed areas, reapply every 2 hours as needed. Call for new or changing lesions.  Staying in the shade or wearing long sleeves, sun glasses (UVA+UVB protection) and wide brim hats (4-inch brim around the entire circumference of the hat) are also recommended for sun protection.    Melanoma ABCDEs  Melanoma is the most dangerous type of skin cancer, and is the leading cause of death from skin disease.  You are more likely to develop  melanoma if you: Have light-colored skin, light-colored eyes, or red or blond hair Spend a lot of time in the sun Tan regularly, either outdoors or in a tanning bed Have had blistering sunburns, especially during childhood Have a close family member who has had a melanoma Have atypical moles or large birthmarks  Early detection of melanoma is key since treatment is typically straightforward and cure rates are extremely high if we catch it early.   The first sign of melanoma is often a change in a mole or a new dark spot.  The ABCDE system is a way of remembering the signs of melanoma.  A for asymmetry:  The two halves do not match. B for border:  The edges of the growth are irregular. C for color:  A mixture of colors are present instead of an even brown color. D for diameter:  Melanomas are usually (but not always) greater than 6mm - the size of a pencil eraser. E for evolution:  The spot keeps changing in size, shape, and color.  Please check your skin once per month between visits. You can use a small mirror in front and a large mirror behind you to keep an eye on the back side or your body.   If you see any new or changing lesions before your next follow-up, please call to schedule a visit.  Please continue daily skin protection including broad spectrum sunscreen SPF 30+ to sun-exposed areas, reapplying every 2 hours  as needed when you're outdoors.   Staying in the shade or wearing long sleeves, sun glasses (UVA+UVB protection) and wide brim hats (4-inch brim around the entire circumference of the hat) are also recommended for sun protection.    Due to recent changes in healthcare laws, you may see results of your pathology and/or laboratory studies on MyChart before the doctors have had a chance to review them. We understand that in some cases there may be results that are confusing or concerning to you. Please understand that not all results are received at the same time and often the  doctors may need to interpret multiple results in order to provide you with the best plan of care or course of treatment. Therefore, we ask that you please give [place] 2 business days to thoroughly review all your results before contacting the office for clarification. Should we see a critical lab result, you will be contacted sooner.   If You Need Anything After Your Visit  If you have any questions or concerns for your doctor, please call our main line at [phone] and press option 4 to reach your doctor's medical assistant. If no one answers, please leave a voicemail as directed and we will return your call as soon as possible. Messages left after 4 pm will be answered the following business day.   You may also send [place] a message via MyChart. We typically respond to MyChart messages within 1-2 business days.  For prescription refills, please ask your pharmacy to contact our office. Our fax number is [phone].  If you have an urgent issue when the clinic is closed that cannot wait until the next business day, you can page your doctor at the number below.    Please note that while we do our best to be available for urgent issues outside of office hours, we are not available 24/7.   If you have an urgent issue and are unable to reach [place], you may choose to seek medical care at your doctor's office, retail clinic, urgent care center, or emergency room.  If you have a medical emergency, please immediately call 911 or go to the emergency department.  Pager Numbers  - Dr. Gwen Pounds: [phone]  - Dr. Roseanne Reno: [phone]  - Dr. Katrinka Blazing: [phone]   In the event of inclement weather, please call our main line at [phone] for an update on the status of any delays or closures.  Dermatology Medication Tips: Please keep the boxes that topical medications come in in order to help keep track of the instructions about where and how to use these. Pharmacies typically print the medication  instructions only on the boxes and not directly on the medication tubes.   If your medication is too expensive, please contact our office at [phone] option 4 or send [place] a message through MyChart.   We are unable to tell what your co-pay for medications will be in advance as this is different depending on your insurance coverage. However, we may be able to find a substitute medication at lower cost or fill out paperwork to get insurance to cover a needed medication.   If a prior authorization is required to get your medication covered by your insurance company, please allow [place] 1-2 business days to complete this process.  Drug prices often vary depending on where the prescription is filled and some pharmacies may offer cheaper prices.  The website www.goodrx.com contains coupons for medications through different pharmacies. The prices here do not account for  what the cost may be with help from insurance (it may be cheaper with your insurance), but the website can give you the price if you did not use any insurance.  - You can print the associated coupon and take it with your prescription to the pharmacy.  - You may also stop by our office during regular business hours and pick up a GoodRx coupon card.  - If you need your prescription sent electronically to a different pharmacy, notify our office through [facility] or by phone at [phone] option 4.     Si Usted Necesita Algo Despus de Su Visita  Tambin puede enviarnos un mensaje a travs de Clinical cytogeneticist. Por lo general respondemos a los mensajes de MyChart en el transcurso de 1 a 2 das hbiles.  Para renovar recetas, por favor pida a su farmacia que se ponga en contacto con nuestra oficina. Annie Sable de fax es [place] [phone].  Si tiene un asunto urgente cuando la clnica est cerrada y que no puede esperar hasta el siguiente da hbil, puede llamar/localizar a su doctor(a) al nmero que aparece a continuacin.   Por  favor, tenga en cuenta que aunque hacemos todo lo posible para estar disponibles para asuntos urgentes fuera del horario de [place], no estamos disponibles las 24 horas del da, los 7 [address]  [address] de la [place].   Si tiene un problema urgente y no puede comunicarse con nosotros, puede optar por buscar atencin mdica  en el consultorio de su doctor(a), en una clnica privada, en un centro de atencin urgente o en una sala de emergencias.  Si tiene Engineer, drilling, por favor llame inmediatamente al 911 o vaya a la sala de emergencias.  Nmeros de bper  - Dr. Gwen Pounds: [phone]  - Dra. Roseanne Reno: [phone]  - Dr. Katrinka Blazing: [phone]   En caso de inclemencias del tiempo, por favor llame a Lacy Duverney principal al [phone] para una actualizacin sobre el [place] de cualquier retraso o cierre.  Consejos para la medicacin en dermatologa: Por favor, guarde las cajas en las que vienen los medicamentos de uso tpico para ayudarle a seguir las instrucciones sobre dnde y cmo usarlos. Las farmacias generalmente imprimen las instrucciones del medicamento slo en las cajas y no directamente en los tubos del [place].   Si su medicamento es muy caro, por favor, pngase en contacto con Rolm Gala llamando al [phone] y presione la opcin 4 o envenos un mensaje a travs de Clinical cytogeneticist.   No podemos decirle cul ser su copago por los medicamentos por adelantado ya que esto es diferente dependiendo de la cobertura de su seguro. Sin embargo, es posible que podamos encontrar un medicamento sustituto a Audiological scientist un formulario para que el seguro cubra el medicamento que se considera necesario.   Si se requiere una autorizacin previa para que su compaa de seguros [place] su medicamento, por favor permtanos de 1 a 2 das hbiles para completar [address]  Los precios de los medicamentos varan con frecuencia dependiendo del Environmental consultant de dnde se surte la receta y alguna farmacias  pueden ofrecer precios ms baratos.  El sitio web www.goodrx.com tiene cupones para medicamentos de Health and safety inspector. Los precios aqu no tienen en cuenta lo que podra costar con la ayuda del seguro (puede ser ms barato con su seguro), pero el sitio web puede darle el precio si no utiliz Tourist information centre manager.  - Puede imprimir el cupn correspondiente y llevarlo con su receta a la farmacia.  Laroy Apple  puede pasar por nuestra oficina durante el horario de atencin regular y Education officer, museum una tarjeta de cupones de GoodRx.  - Si necesita que su receta se enve electrnicamente a una farmacia diferente, informe a nuestra oficina a travs de MyChart de [place] o por telfono llamando al [phone] y presione la opcin 4.

## 2023-05-14 ENCOUNTER — Telehealth: Payer: Self-pay | Admitting: Neurology

## 2023-05-14 NOTE — Telephone Encounter (Signed)
REQUIRED PHONE NOTE: sister called to cancel appointment, its no longer needed

## 2023-05-15 ENCOUNTER — Telehealth: Payer: Self-pay

## 2023-05-15 NOTE — Telephone Encounter (Signed)
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 Butch Penny., RMA

## 2023-05-15 NOTE — Telephone Encounter (Signed)
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 The biopsy shows an atypical mole. They are benign, but suggest you may be at a higher risk of melanoma. No further treatment is needed, but we recommend skin checks at least annually. We will recheck the scar at your next skin check.

## 2023-05-26 ENCOUNTER — Other Ambulatory Visit: Payer: Self-pay | Admitting: Cardiology

## 2023-06-11 ENCOUNTER — Ambulatory Visit
Admission: RE | Admit: 2023-06-11 | Discharge: 2023-06-11 | Disposition: A | Discharge status: Home / Self Care | Payer: Medicare Other | Source: Ambulatory Visit | Attending: Internal Medicine | Admitting: Internal Medicine

## 2023-06-11 DIAGNOSIS — C50822 Malignant neoplasm of overlapping sites of left male breast: Secondary | ICD-10-CM

## 2023-06-21 ENCOUNTER — Ambulatory Visit (INDEPENDENT_AMBULATORY_CARE_PROVIDER_SITE_OTHER): Payer: Medicare Other | Admitting: Podiatry

## 2023-06-21 ENCOUNTER — Encounter: Payer: Self-pay | Admitting: Podiatry

## 2023-06-21 DIAGNOSIS — E119 Type 2 diabetes mellitus without complications: Secondary | ICD-10-CM | POA: Diagnosis not present

## 2023-06-21 DIAGNOSIS — M79674 Pain in right toe(s): Secondary | ICD-10-CM

## 2023-06-21 DIAGNOSIS — B351 Tinea unguium: Secondary | ICD-10-CM

## 2023-06-21 DIAGNOSIS — M79675 Pain in left toe(s): Secondary | ICD-10-CM | POA: Diagnosis not present

## 2023-06-22 ENCOUNTER — Encounter: Payer: Self-pay | Admitting: Nurse Practitioner

## 2023-06-22 ENCOUNTER — Other Ambulatory Visit: Payer: Medicare Other

## 2023-06-22 ENCOUNTER — Inpatient Hospital Stay: Payer: Medicare Other | Attending: Internal Medicine

## 2023-06-22 ENCOUNTER — Ambulatory Visit: Payer: Medicare Other | Admitting: Internal Medicine

## 2023-06-22 ENCOUNTER — Inpatient Hospital Stay (HOSPITAL_BASED_OUTPATIENT_CLINIC_OR_DEPARTMENT_OTHER): Payer: Medicare Other | Admitting: Nurse Practitioner

## 2023-06-22 VITALS — BP 131/61 | HR 76 | Temp 97.6°F | Wt 274.0 lb

## 2023-06-22 DIAGNOSIS — F17210 Nicotine dependence, cigarettes, uncomplicated: Secondary | ICD-10-CM | POA: Diagnosis not present

## 2023-06-22 DIAGNOSIS — Z170 Estrogen receptor positive status [ER+]: Secondary | ICD-10-CM | POA: Insufficient documentation

## 2023-06-22 DIAGNOSIS — C50822 Malignant neoplasm of overlapping sites of left male breast: Secondary | ICD-10-CM

## 2023-06-22 DIAGNOSIS — Z79810 Long term (current) use of selective estrogen receptor modulators (SERMs): Secondary | ICD-10-CM

## 2023-06-22 DIAGNOSIS — Z79899 Other long term (current) drug therapy: Secondary | ICD-10-CM | POA: Diagnosis not present

## 2023-06-22 DIAGNOSIS — Z923 Personal history of irradiation: Secondary | ICD-10-CM | POA: Insufficient documentation

## 2023-06-22 DIAGNOSIS — Z853 Personal history of malignant neoplasm of breast: Secondary | ICD-10-CM

## 2023-06-22 DIAGNOSIS — Z9012 Acquired absence of left breast and nipple: Secondary | ICD-10-CM | POA: Diagnosis not present

## 2023-06-22 DIAGNOSIS — R918 Other nonspecific abnormal finding of lung field: Secondary | ICD-10-CM

## 2023-06-22 DIAGNOSIS — Z5181 Encounter for therapeutic drug level monitoring: Secondary | ICD-10-CM

## 2023-06-22 DIAGNOSIS — Z08 Encounter for follow-up examination after completed treatment for malignant neoplasm: Secondary | ICD-10-CM

## 2023-06-22 LAB — CBC WITH DIFFERENTIAL (CANCER CENTER ONLY)
Abs Immature Granulocytes: 0.06 10*3/uL (ref 0.00–0.07)
Basophils Absolute: 0.1 10*3/uL (ref 0.0–0.1)
Basophils Relative: 1 %
Eosinophils Absolute: 0.4 10*3/uL (ref 0.0–0.5)
Eosinophils Relative: 4 %
HCT: 43.4 % (ref 39.0–52.0)
Hemoglobin: 14.4 g/dL (ref 13.0–17.0)
Immature Granulocytes: 1 %
Lymphocytes Relative: 20 %
Lymphs Abs: 1.8 10*3/uL (ref 0.7–4.0)
MCH: 30.3 pg (ref 26.0–34.0)
MCHC: 33.2 g/dL (ref 30.0–36.0)
MCV: 91.2 fL (ref 80.0–100.0)
Monocytes Absolute: 0.9 10*3/uL (ref 0.1–1.0)
Monocytes Relative: 11 %
Neutro Abs: 5.5 10*3/uL (ref 1.7–7.7)
Neutrophils Relative %: 63 %
Platelet Count: 162 10*3/uL (ref 150–400)
RBC: 4.76 MIL/uL (ref 4.22–5.81)
RDW: 14.6 % (ref 11.5–15.5)
WBC Count: 8.7 10*3/uL (ref 4.0–10.5)
nRBC: 0 % (ref 0.0–0.2)

## 2023-06-22 LAB — CMP (CANCER CENTER ONLY)
ALT: 26 U/L (ref 0–44)
AST: 23 U/L (ref 15–41)
Albumin: 3.4 g/dL — ABNORMAL LOW (ref 3.5–5.0)
Alkaline Phosphatase: 44 U/L (ref 38–126)
Anion gap: 8 (ref 5–15)
BUN: 22 mg/dL (ref 8–23)
CO2: 27 mmol/L (ref 22–32)
Calcium: 8.4 mg/dL — ABNORMAL LOW (ref 8.9–10.3)
Chloride: 102 mmol/L (ref 98–111)
Creatinine: 1.2 mg/dL (ref 0.61–1.24)
GFR, Estimated: >60 mL/min (ref >60)
Glucose, Bld: 126 mg/dL — ABNORMAL HIGH (ref 70–99)
Potassium: 3.7 mmol/L (ref 3.5–5.1)
Sodium: 137 mmol/L (ref 135–145)
Total Bilirubin: 0.2 mg/dL — ABNORMAL LOW (ref 0.3–1.2)
Total Protein: 6.5 g/dL (ref 6.5–8.1)
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
## 2023-06-25 ENCOUNTER — Telehealth: Payer: Self-pay | Admitting: Cardiology

## 2023-06-25 NOTE — Telephone Encounter (Signed)
  Pt's sister June calling, she said, pt had CT done on 10/07 with the result that pt has 3 vessel blockage. She is requesting if Dr. Azucena Cecil can review the result and get his recommendations.

## 2023-06-25 NOTE — Telephone Encounter (Signed)
Per Chest CT 06/11/23: Cardiovascular: Normal heart size. No significant pericardial effusion/thickening. Stable lipomatous hypertrophy of the interatrial septum. Three-vessel coronary atherosclerosis. Atherosclerotic nonaneurysmal thoracic aorta. Normal caliber pulmonary arteries.  Pt's sister would like Dr. Azucena Cecil to review and offer any recommendations.

## 2023-06-27 ENCOUNTER — Encounter: Payer: Self-pay | Admitting: Cardiology

## 2023-06-29 ENCOUNTER — Other Ambulatory Visit: Payer: Self-pay

## 2023-06-29 MED ORDER — BEMPEDOIC ACID 180 MG PO TABS: 180.0000 mg | ORAL_TABLET | Freq: Every day | ORAL | 3 refills | Status: DC

## 2023-06-29 NOTE — Telephone Encounter (Signed)
Results were discussed with patient in a different encounter - 06/29/2023

## 2023-06-29 NOTE — Telephone Encounter (Signed)
Called patient.  Reviewed Dr. Azucena Cecil recommendations as followed:   For coronary artery calcifications, recommend starting aspirin 81 mg daily, continue Zetia.  Patient is not tolerant to statins in the past for adequate cholesterol control.  Start bempedoic acid 180 mg daily.  Recommend smoking cessation.

## 2023-06-30 NOTE — Progress Notes (Addendum)
Subjective:  Patient ID: Benjamin Armstrong, male    DOB: Feb 06, 1956,  MRN: [id number]  Benjamin Armstrong presents to clinic today for preventative diabetic foot care and painful elongated mycotic toenails 1-5 bilaterally which are tender when wearing enclosed shoe gear. Pain is relieved with periodic professional debridement.   His sister is present during today's visit.  New problem(s): None.   PCP is Benjamin Luis, MD.  Allergies  Allergen Reactions   Bee Venom    Tape Rash    bandaids    Review of Systems: Negative except as noted in the HPI.  Objective:  There were no vitals filed for this visit. Benjamin Armstrong is a pleasant 67 y.o. male obese in NAD. AAO x 3.  Vascular Examination: CFT <3 seconds b/l. DP pulses faintly palpable b/l. PT pulses nonpalpable b/l. Digital hair absent. Skin temperature gradient warm to warm b/l. No pain with calf compression. No ischemia or gangrene. No cyanosis or clubbing noted b/l.    Neurological Examination: Sensation grossly intact b/l with 10 gram monofilament. Vibratory sensation intact b/l.   Dermatological Examination: Pedal skin warm and supple b/l. No open wounds b/l. No interdigital macerations. Toenails 1-5 b/l thick, discolored, elongated with subungual debris and pain on dorsal palpation.  No corns, calluses nor porokeratotic lesions noted.  Musculoskeletal Examination: Muscle strength 5/5 to all lower extremity muscle groups bilaterally. No pain, crepitus or joint limitation noted with ROM bilateral LE. No gross bony deformities bilaterally.  Radiographs: None  Last HgA1c:      Latest Ref Rng & Units 12/22/2022   12:24 PM 09/20/2022   12:11 PM  Hemoglobin A1C  Hemoglobin-A1c 4.0 - 5.6 % 6.6  6.5    Assessment/Plan: 1. Pain due to onychomycosis of toenails of both feet   2. Controlled type 2 diabetes mellitus without complication, without long-term current use of insulin (HCC)     -Patient's family member present. All  questions/concerns addressed on today's visit. -Consent given for treatment as described below: -Examined patient. -Continue foot and shoe inspections daily. Monitor blood glucose per PCP/Endocrinologist's recommendations. -Mycotic toenails 1-5 bilaterally were debrided in length and girth with sterile nail nippers and dremel without incident. -Patient/POA to call should there be question/concern in the interim.   Return in about 9 weeks (around 08/23/2023).  Benjamin Armstrong, DPM

## 2023-07-02 ENCOUNTER — Telehealth: Payer: Self-pay | Admitting: Pharmacist

## 2023-07-02 ENCOUNTER — Other Ambulatory Visit: Payer: Self-pay

## 2023-07-02 DIAGNOSIS — E782 Mixed hyperlipidemia: Secondary | ICD-10-CM

## 2023-07-02 NOTE — Telephone Encounter (Signed)
Received voicemail from patient's sister, [name], wanting to discuss cost support options for Nexletol that was just added by Dr. Azucena Cecil. Sending message to Lillia Abed to outreach.

## 2023-07-04 ENCOUNTER — Encounter: Payer: Self-pay | Admitting: Family Medicine

## 2023-07-04 ENCOUNTER — Ambulatory Visit (INDEPENDENT_AMBULATORY_CARE_PROVIDER_SITE_OTHER): Payer: Medicare Other | Admitting: Family Medicine

## 2023-07-04 VITALS — BP 108/64 | HR 87 | Temp 97.9°F | Ht 74.0 in | Wt 254.2 lb

## 2023-07-04 DIAGNOSIS — R42 Dizziness and giddiness: Secondary | ICD-10-CM | POA: Diagnosis not present

## 2023-07-04 DIAGNOSIS — E119 Type 2 diabetes mellitus without complications: Secondary | ICD-10-CM

## 2023-07-04 DIAGNOSIS — E785 Hyperlipidemia, unspecified: Secondary | ICD-10-CM | POA: Diagnosis not present

## 2023-07-04 DIAGNOSIS — I10 Essential (primary) hypertension: Secondary | ICD-10-CM

## 2023-07-04 DIAGNOSIS — R600 Localized edema: Secondary | ICD-10-CM | POA: Diagnosis not present

## 2023-07-04 NOTE — Assessment & Plan Note (Signed)
Refer to clinical pharmacy for help with assistance for bempedoic acid.

## 2023-07-04 NOTE — Assessment & Plan Note (Signed)
Chronic issue.  Check A1c. 

## 2023-07-04 NOTE — Assessment & Plan Note (Signed)
Suspect orthostasis based on history.  I will communicate with cardiology to get their input on reducing his diuretic.

## 2023-07-04 NOTE — Progress Notes (Signed)
Marikay Alar, MD Phone: [phone]  Benjamin Armstrong is a 67 y.o. male who presents today for follow-up.  Diabetes: Some polyuria with his fluid pills.  No polydipsia.  He has not seen ophthalmology in the last year.  Hypertension: Notes no chest pressure.  Occasionally has a needlelike sensation in his chest that last for split-second.  No change to chronic dyspnea.  Does have some lower extremity edema bilaterally.  Notes this does not resolve overnight.  He is wearing compression stockings for this.  He is currently taking furosemide 60 mg daily.  Reports he is not taking torsemide.  Lightheadedness: Patient notes intermittent issues with lightheadedness.  Notes this does not occur when he sitting down.  He gets lightheaded when he gets up or when he is walking around.  At times blood pressures down into the low 100s over 60s.  Social History   Tobacco Use  Smoking Status Every Day   Current packs/day: 1.00   Average packs/day: 1 pack/day for 53.0 years (53.0 ttl pk-yrs)   Types: Cigarettes  Smokeless Tobacco Never    Current Outpatient Medications on File Prior to Visit  Medication Sig Dispense Refill   albuterol (VENTOLIN HFA) 108 (90 Base) MCG/ACT inhaler Inhale 2 puffs into the lungs every 6 (six) hours as needed for wheezing or shortness of breath. 18 each 0   Bempedoic Acid 180 MG TABS Take 1 tablet (180 mg total) by mouth daily. 30 tablet 3   EPINEPHrine 0.3 mg/0.3 mL IJ SOAJ injection Inject 0.3 mg into the muscle as needed for anaphylaxis. 2 each 1   ezetimibe (ZETIA) 10 MG tablet Take 1 tablet by mouth daily.     Famotidine (PEPCID AC PO) Take 1 tablet by mouth as needed.     furosemide (LASIX) 20 MG tablet TAKE 2 TABLETS (40 MG TOTAL) BY MOUTH DAILY. FOR SWELLING     naproxen sodium (ALEVE) 220 MG tablet Take 220 mg by mouth 2 (two) times daily as needed.     potassium chloride SA (KLOR-CON M20) 20 MEQ tablet Take 1.5 tablets (30 mEq total) by mouth daily. 135 tablet 3    sildenafil (VIAGRA) 100 MG tablet Take 1 tablet (100 mg total) by mouth daily as needed for erectile dysfunction. 30 tablet 11   tamoxifen (NOLVADEX) 20 MG tablet Take 1 tablet (20 mg total) by mouth daily. DO NOT START UNTIL directed by MD. 90 tablet 1   tamsulosin (FLOMAX) 0.4 MG CAPS capsule Take 0.4 mg by mouth.     Tiotropium Bromide-Olodaterol (STIOLTO RESPIMAT) 2.5-2.5 MCG/ACT AERS Inhale 2 puffs into the lungs daily.     torsemide (DEMADEX) 20 MG tablet TAKE 3 TABLETS BY MOUTH EVERY DAY 270 tablet 3   No current facility-administered medications on file prior to visit.     ROS see history of present illness  Objective  Physical Exam Vitals:   07/04/23 1311  BP: 108/64  Pulse: 87  Temp: 97.9 F (36.6 C)  SpO2: 95%   Lying blood pressure 133/81 pulse 81 Sitting blood pressure 129/76 pulse 87 Standing blood pressure 135/76 pulse 91  BP Readings from Last 3 Encounters:  07/04/23 108/64  06/22/23 131/61  05/09/23 (!) 142/82   Wt Readings from Last 3 Encounters:  07/04/23 254 lb 3.2 oz (115.3 kg)  06/22/23 274 lb (124.3 kg)  05/09/23 245 lb (111.1 kg)    Physical Exam Constitutional:      General: He is not in acute distress.    Appearance: He  is not diaphoretic.  Cardiovascular:     Rate and Rhythm: Normal rate and regular rhythm.     Heart sounds: Normal heart sounds.  Pulmonary:     Effort: Pulmonary effort is normal.     Breath sounds: Normal breath sounds.  Skin:    General: Skin is warm and dry.  Neurological:     Mental Status: He is alert.      Assessment/Plan: Please see individual problem list.  Essential hypertension Assessment & Plan: Chronic issue.  Adequately controlled.  Patient is currently taking furosemide 60 mg daily.  I will have him and his sister check on whether or not this is torsemide or furosemide.  I will communicate with his cardiology team to see whether or not they would like to reduce his dose given his lightheadedness  issues.  Lab work as outlined.   Lightheadedness Assessment & Plan: Suspect orthostasis based on history.  I will communicate with cardiology to get their input on reducing his diuretic.   Hyperlipidemia, unspecified hyperlipidemia type Assessment & Plan: Refer to clinical pharmacy for help with assistance for bempedoic acid.  Orders: -     AMB Referral [facility] Care Management -     Lipid panel  Type 2 diabetes mellitus without complication, without long-term current use of insulin (HCC) Assessment & Plan: Chronic issue.  Check A1c.  Orders: -     Microalbumin / creatinine urine ratio -     Hemoglobin A1c  Lower extremity edema Assessment & Plan: Bilateral.  Possibly venous insufficiency related.  Echo did not reveal systolic heart failure.  Patient is currently on a diuretic and may be on furosemide or torsemide at this time.  He will confirm which when he is taking.  He will continue compression stockings.  We will check TSH.  Recent CMP and CBC without cause for swelling.  Orders: -     TSH     Health Maintenance: Encouraged patient to schedule with his eye doctor for her yearly eye visit.  Return in about 3 months (around 10/04/2023) for Transfer of care.   Marikay Alar, MD [facility]

## 2023-07-04 NOTE — Patient Instructions (Addendum)
Nice to see you. Please confirm whether or not you are taking torsemide or furosemide. I will let you know what your cardiologist says regarding reducing the dose of your diuretic.

## 2023-07-04 NOTE — Assessment & Plan Note (Signed)
Chronic issue.  Adequately controlled.  Patient is currently taking furosemide 60 mg daily.  I will have him and his sister check on whether or not this is torsemide or furosemide.  I will communicate with his cardiology team to see whether or not they would like to reduce his dose given his lightheadedness issues.  Lab work as outlined.

## 2023-07-04 NOTE — Assessment & Plan Note (Signed)
Bilateral.  Possibly venous insufficiency related.  Echo did not reveal systolic heart failure.  Patient is currently on a diuretic and may be on furosemide or torsemide at this time.  He will confirm which when he is taking.  He will continue compression stockings.  We will check TSH.  Recent CMP and CBC without cause for swelling.

## 2023-07-05 ENCOUNTER — Telehealth: Payer: Self-pay

## 2023-07-05 ENCOUNTER — Other Ambulatory Visit: Payer: Self-pay | Admitting: Pharmacist

## 2023-07-05 ENCOUNTER — Encounter: Payer: Self-pay | Admitting: Pharmacist

## 2023-07-05 ENCOUNTER — Encounter: Payer: Self-pay | Admitting: Cardiology

## 2023-07-05 LAB — LIPID PANEL
Cholesterol: 203 mg/dL — ABNORMAL HIGH (ref 0–200)
HDL: 35.2 mg/dL — ABNORMAL LOW (ref >39.00)
LDL Cholesterol: 109 mg/dL — ABNORMAL HIGH (ref 0–99)
NonHDL: 167.92
Total CHOL/HDL Ratio: 6
Triglycerides: 295 mg/dL — ABNORMAL HIGH (ref 0.0–149.0)
VLDL: 59 mg/dL — ABNORMAL HIGH (ref 0.0–40.0)

## 2023-07-05 LAB — HEMOGLOBIN A1C: Hgb A1c MFr Bld: 7.3 % — ABNORMAL HIGH (ref 4.6–6.5)

## 2023-07-05 LAB — MICROALBUMIN / CREATININE URINE RATIO
Creatinine,U: 113.4 mg/dL
Microalb Creat Ratio: 0.6 mg/g (ref 0.0–30.0)
Microalb, Ur: <0.7 mg/dL (ref 0.0–1.9)

## 2023-07-05 LAB — TSH: TSH: 2.2 u[IU]/mL (ref 0.35–5.50)

## 2023-07-05 NOTE — Telephone Encounter (Signed)
Phone call to pt. During his appointment yesterday he dropped of a [employer] PAP application for 2025 enrollment.  Pt had not filled out his portion of the application.  Called to explain that I have highlighted the parts of the application that he needs to complete and return to the office.  Pt voiced understanding.  Envelope placed at front for pick up.

## 2023-07-05 NOTE — Patient Instructions (Addendum)
Hello Mr. Benjamin Armstrong,   I am the clinical pharmacist at Dr. Birdie Sons, Yehuda Mao, MD's office. I am hopeful that you may be eligible for a savings program to help with the cost of your new medication from the cardiologist (Nexlotol, aka bempedoic acid).   The program is called [employer]. I would be happy to assist you with the online application. If you are approved, this program would cover the cost of your copay.   I will plan to reach out by phone to finalize the application and to answer any questions you may have about the program.   You may reach me by responding to this MyChart Message, or by leaving me a voicemail on my direct office phone: [phone].   Thanks!  Loree Fee, PharmD Clinical Pharmacist [facility] [phone]

## 2023-07-09 ENCOUNTER — Other Ambulatory Visit: Payer: Self-pay

## 2023-07-09 ENCOUNTER — Other Ambulatory Visit: Payer: Self-pay | Admitting: Family Medicine

## 2023-07-09 DIAGNOSIS — E119 Type 2 diabetes mellitus without complications: Secondary | ICD-10-CM

## 2023-07-09 MED ORDER — METFORMIN HCL 500 MG PO TABS
500.0000 mg | ORAL_TABLET | Freq: Two times a day (BID) | ORAL | 3 refills | Status: DC
Start: 2023-07-09 — End: 2023-10-08

## 2023-07-10 ENCOUNTER — Other Ambulatory Visit: Payer: Self-pay | Admitting: *Deleted

## 2023-07-10 ENCOUNTER — Telehealth: Payer: Self-pay

## 2023-07-10 NOTE — Telephone Encounter (Signed)
-----   Message from Marikay Alar sent at 07/10/2023 11:09 AM EST ----- Noted.  The compression stockings that I would prescribe would be fitted for him and would likely have more pressure than what he is currently using.  I think this would help more than any over-the-counter compression stockings.  I am not sure increase in the torsemide would be helpful given that he does not have systolic heart failure.  I am happy to provide a prescription for the compression stockings if he would like.

## 2023-07-10 NOTE — Telephone Encounter (Signed)
Left message to call the office back regarding Dr. Purvis Sheffield message below.

## 2023-07-13 ENCOUNTER — Other Ambulatory Visit: Payer: Self-pay

## 2023-07-13 MED ORDER — FENOFIBRATE 145 MG PO TABS: 145.0000 mg | ORAL_TABLET | Freq: Every day | ORAL | 0 refills | Status: DC

## 2023-07-13 NOTE — Telephone Encounter (Signed)
Medication has been sent.  

## 2023-07-20 ENCOUNTER — Ambulatory Visit: Payer: Medicare Other | Admitting: Neurology

## 2023-08-07 ENCOUNTER — Other Ambulatory Visit: Payer: Self-pay

## 2023-08-07 NOTE — Telephone Encounter (Signed)
Furosemide refill denied due to Med changed to Torsemide on 11/27/22  Requested Prescriptions   Refused Prescriptions Disp Refills   furosemide (LASIX) 20 MG tablet 180 tablet 3    Sig: Take 2 tablets (40 mg total) by mouth daily. For swelling    Refused By: Guerry Minors    Reason for Refusal: Refill not appropriate

## 2023-08-09 NOTE — Telephone Encounter (Signed)
[employer] PAP application faxed to [phone] and received confirmation.  Originals placed in red folder to be scanned into pt's chart.

## 2023-08-10 ENCOUNTER — Other Ambulatory Visit: Payer: Self-pay | Admitting: Internal Medicine

## 2023-08-10 DIAGNOSIS — R0609 Other forms of dyspnea: Secondary | ICD-10-CM

## 2023-08-15 LAB — HM DIABETES EYE EXAM

## 2023-09-03 ENCOUNTER — Other Ambulatory Visit: Payer: Self-pay | Admitting: Family Medicine

## 2023-09-03 ENCOUNTER — Ambulatory Visit
Admission: RE | Admit: 2023-09-03 | Discharge: 2023-09-03 | Disposition: A | Discharge status: Home / Self Care | Payer: Medicare Other | Source: Ambulatory Visit | Attending: Radiation Oncology | Admitting: Radiation Oncology

## 2023-09-03 ENCOUNTER — Other Ambulatory Visit: Payer: Self-pay | Admitting: *Deleted

## 2023-09-03 ENCOUNTER — Encounter: Payer: Self-pay | Admitting: Radiation Oncology

## 2023-09-03 VITALS — BP 121/68 | HR 84 | Temp 98.0°F | Resp 18 | Ht 74.0 in | Wt 247.4 lb

## 2023-09-03 DIAGNOSIS — Z923 Personal history of irradiation: Secondary | ICD-10-CM | POA: Diagnosis not present

## 2023-09-03 DIAGNOSIS — Z9013 Acquired absence of bilateral breasts and nipples: Secondary | ICD-10-CM | POA: Diagnosis not present

## 2023-09-03 DIAGNOSIS — Z85828 Personal history of other malignant neoplasm of skin: Secondary | ICD-10-CM | POA: Insufficient documentation

## 2023-09-03 DIAGNOSIS — R0609 Other forms of dyspnea: Secondary | ICD-10-CM

## 2023-09-03 DIAGNOSIS — C50822 Malignant neoplasm of overlapping sites of left male breast: Secondary | ICD-10-CM

## 2023-09-03 DIAGNOSIS — Z853 Personal history of malignant neoplasm of breast: Secondary | ICD-10-CM | POA: Insufficient documentation

## 2023-09-03 DIAGNOSIS — R918 Other nonspecific abnormal finding of lung field: Secondary | ICD-10-CM | POA: Diagnosis not present

## 2023-09-03 MED ORDER — FENOFIBRATE 145 MG PO TABS: 145.0000 mg | ORAL_TABLET | Freq: Every day | ORAL | 0 refills | Status: DC

## 2023-09-03 NOTE — Progress Notes (Signed)
Radiation Oncology Follow up Note  Name: Benjamin Armstrong   Date:   09/03/2023 MRN:  [id number] DOB: 06/10/1956    This 67 y.o. 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  Seen today in follow-up he is doing well still has some tightness in his scar the lateral edge.[name] Kitchen  He is also recently had some skin cancers on his face removed with Mohs.  He has some issues with that and I have referred him back to his dermatologist for that.  He also had a recent CT scan back in October showing only some indistinct patchy bilateral upper lobe pulmonary nodules favoring infectious or inflammatory nodularity.  COMPLICATIONS OF TREATMENT: none  FOLLOW UP COMPLIANCE: keeps appointments   PHYSICAL EXAM:  BP 121/68   Pulse 84   Temp 98 F (36.7 C) (Tympanic)   Resp 18   Ht 6\' 2"  (1.88 m)   Wt 247 lb 6.4 oz (112.2 kg)   BMI 31.76 kg/m  Patient status post left modified radical mastectomy.  Chest wall is clear no evidence of mass or nodularity is noted no evidence of lymphedema in his left upper extremity.  Well-developed well-nourished patient in NAD. HEENT reveals PERLA, EOMI, discs not visualized.  Oral cavity is clear. No oral mucosal lesions are identified. Neck is clear without evidence of cervical or supraclavicular adenopathy. Lungs are clear to A&P. Cardiac examination is essentially unremarkable with regular rate and rhythm without murmur rub or thrill. Abdomen is benign with no organomegaly or masses noted. Motor sensory and DTR levels are equal and symmetric in the upper and  lower extremities. Cranial nerves II through XII are grossly intact. Proprioception is intact. No peripheral adenopathy or edema is identified. No motor or sensory levels are noted. Crude visual fields are within normal range.  RADIOLOGY RESULTS: CT scan reviewed compatible with above-stated findings  PLAN: Present time patient is now at a year with no evidence of disease.  I am going to turn follow-up care over to Dr. Orlie Dakin.  I be happy to reevaluate this patient in time should that be indicated.  Patient knows to I would like to take this opportunity to thank you for allowing me to participate in the care of your patient.Carmina Miller, MD

## 2023-09-04 ENCOUNTER — Encounter: Payer: Self-pay | Admitting: Nurse Practitioner

## 2023-09-04 MED ORDER — TAMOXIFEN CITRATE 20 MG PO TABS: 20.0000 mg | ORAL_TABLET | Freq: Every day | ORAL | 1 refills | Status: DC

## 2023-09-06 ENCOUNTER — Encounter: Payer: Self-pay | Admitting: Podiatry

## 2023-09-06 ENCOUNTER — Ambulatory Visit (INDEPENDENT_AMBULATORY_CARE_PROVIDER_SITE_OTHER): Payer: Medicare Other | Admitting: Podiatry

## 2023-09-06 DIAGNOSIS — M79674 Pain in right toe(s): Secondary | ICD-10-CM

## 2023-09-06 DIAGNOSIS — M79675 Pain in left toe(s): Secondary | ICD-10-CM

## 2023-09-06 DIAGNOSIS — E119 Type 2 diabetes mellitus without complications: Secondary | ICD-10-CM | POA: Diagnosis not present

## 2023-09-06 DIAGNOSIS — B351 Tinea unguium: Secondary | ICD-10-CM | POA: Diagnosis not present

## 2023-09-10 NOTE — Progress Notes (Signed)
  Subjective:  Patient ID: Benjamin Armstrong, male    DOB: [birth year],  MRN: [id number]  68 y.o. male presents preventative diabetic foot care and painful thick toenails that are difficult to trim. Pain interferes with ambulation. Aggravating factors include wearing enclosed shoe gear. Pain is relieved with periodic professional debridement.  New problem(s): None   PCP is Benjamin Camellia MATSU, MD , and last visit was July 04, 2023.  Allergies  Allergen Reactions   Bee Venom    Tape Rash    bandaids   Review of Systems: Negative except as noted in the HPI.   Objective:  Benjamin Armstrong is a pleasant 68 y.o. male obese in NAD. AAO x 3.  Vascular Examination: CFT <3 seconds b/l. DP pulses faintly palpable b/l. PT pulses nonpalpable b/l. Digital hair absent. Skin temperature gradient warm to warm b/l. No pain with calf compression. No ischemia or gangrene. No cyanosis or clubbing noted b/l.    Neurological Examination: Sensation grossly intact b/l with 10 gram monofilament.  Dermatological Examination: Pedal skin warm and supple b/l. No open wounds b/l. No interdigital macerations. Toenails 1-5 b/l thick, discolored, elongated with subungual debris and pain on dorsal palpation.  No hyperkeratotic nor porokeratotic lesions present on today's visit.  Musculoskeletal Examination: Normal muscle strength 5/5 to all lower extremity muscle groups bilaterally. No pain, crepitus or joint limitation noted with ROM b/l LE. No gross bony pedal deformities b/l. Patient ambulates independently without assistive aids.  Radiographs: None  Last HgA1c:      Latest Ref Rng & Units 07/04/2023    2:03 PM 12/22/2022   12:24 PM 09/20/2022   12:11 PM  Hemoglobin A1C  Hemoglobin-A1c 4.6 - 6.5 % 7.3  6.6  6.5    Assessment:   1. Pain due to onychomycosis of toenails of both feet   2. Controlled type 2 diabetes mellitus without complication, without long-term current use of insulin (HCC)    Plan:  Patient  was evaluated and treated. All patient's and/or POA's questions/concerns addressed on today's visit. Toenails 1-5 debrided in length and girth without incident. Continue soft, supportive shoe gear daily. Report any pedal injuries to medical professional. Call office if there are any questions/concerns. -Continue foot and shoe inspections daily. Monitor blood glucose per PCP/Endocrinologist's recommendations. -Patient/POA to call should there be question/concern in the interim.  Return in about 9 weeks (around 11/08/2023).  Delon LITTIE Merlin, DPM      [place] LOCATION: [address] [place] [address]                   Office [phone]   [facility] LOCATION: [address] [place], [place] [address] Office [phone]

## 2023-10-01 ENCOUNTER — Other Ambulatory Visit: Payer: Self-pay | Admitting: Family Medicine

## 2023-10-01 DIAGNOSIS — R0609 Other forms of dyspnea: Secondary | ICD-10-CM

## 2023-10-04 ENCOUNTER — Encounter: Payer: Medicare Other | Admitting: Family Medicine

## 2023-10-05 ENCOUNTER — Telehealth: Payer: Self-pay

## 2023-10-05 ENCOUNTER — Encounter: Payer: Self-pay | Admitting: Pharmacist

## 2023-10-05 NOTE — Progress Notes (Signed)
Patient's Sister called requesting call back to discuss Medication coverage  Reports receiving letter in the mail reporting that Nexletol will not be covered on new insurance plan.   Per [employer], new insurance plan as follows:  2025 Administrator PDP Retail Super ID ([employer] S[employer]IPTS)  [employer] Value Script Plan ID: [id number] $0.00  Monthly premium $590.00  Drug deductible  Drug Benefit:  Formulary ID: [id number] Tier 1 generic medication = $10/month Part B Drugs = 20% coinsurance    _________________________________________________________________________  [facility] Prior Authorization for bempedoic acid tablet (Nexletol) Coralie Common (Key: Fredirick Lathe) - pending decision  Future Consideration: Could try for ezetimibe or [id number] if Nexletol not covered ([employer] grant will only work on medications covered by insurance)   [place] APPR[place]ED  Approved today by [employer] Medicare Approved. This drug has been approved under the Member's Medicare Part D benefit. Approved quantity: 30 units per 30 day(s). You may fill up to a 90 day supply except for those on Specialty Tier 5, which can be filled up to a 30 day supply. Please call the pharmacy to process the prescription claim. Authorization Expiration Date: 09/03/2098  Discussed result w patient's sister.

## 2023-10-05 NOTE — Telephone Encounter (Signed)
Provider office sent origimal App on 08-09-23, followed up with BI and no record of faxed document, so resubmitted 10-05-2023

## 2023-10-08 ENCOUNTER — Ambulatory Visit: Payer: Medicare Other | Admitting: Family Medicine

## 2023-10-08 ENCOUNTER — Telehealth: Payer: Self-pay | Admitting: Family Medicine

## 2023-10-08 ENCOUNTER — Encounter: Payer: Self-pay | Admitting: Family Medicine

## 2023-10-08 VITALS — BP 124/72 | HR 92 | Temp 98.2°F | Ht 74.0 in | Wt 243.0 lb

## 2023-10-08 DIAGNOSIS — R21 Rash and other nonspecific skin eruption: Secondary | ICD-10-CM

## 2023-10-08 DIAGNOSIS — R238 Other skin changes: Secondary | ICD-10-CM | POA: Insufficient documentation

## 2023-10-08 DIAGNOSIS — J449 Chronic obstructive pulmonary disease, unspecified: Secondary | ICD-10-CM | POA: Diagnosis not present

## 2023-10-08 DIAGNOSIS — K5900 Constipation, unspecified: Secondary | ICD-10-CM

## 2023-10-08 DIAGNOSIS — Z7984 Long term (current) use of oral hypoglycemic drugs: Secondary | ICD-10-CM

## 2023-10-08 DIAGNOSIS — E119 Type 2 diabetes mellitus without complications: Secondary | ICD-10-CM | POA: Diagnosis not present

## 2023-10-08 LAB — POCT GLYCOSYLATED HEMOGLOBIN (HGB A1C): Hemoglobin A1C: 6.2 % — AB (ref 4.0–5.6)

## 2023-10-08 MED ORDER — METFORMIN HCL 500 MG PO TABS
500.0000 mg | ORAL_TABLET | Freq: Two times a day (BID) | ORAL | 1 refills | Status: DC
Start: 2023-10-08 — End: 2024-05-22

## 2023-10-08 MED ORDER — EPINEPHRINE 0.3 MG/0.3ML IJ SOAJ: 0.3000 mg | INTRAMUSCULAR | 0 refills | Status: AC | PRN

## 2023-10-08 MED ORDER — STIOLTO RESPIMAT 2.5-2.5 MCG/ACT IN AERS: 2.0000 | INHALATION_SPRAY | Freq: Every day | RESPIRATORY_TRACT | 1 refills | Status: AC

## 2023-10-08 MED ORDER — TRIAMCINOLONE ACETONIDE 0.1 % EX CREA
1.0000 | TOPICAL_CREAM | Freq: Two times a day (BID) | CUTANEOUS | 0 refills | Status: DC
Start: 2023-10-08 — End: 2024-01-07

## 2023-10-08 NOTE — Assessment & Plan Note (Addendum)
Possibly related to nerve damage from his prior surgery.  He also had some rib fractures at 1 point and this may be related to that.  At this point he will monitor as there is not much else to do for either of those things unless the pain gets too significant.

## 2023-10-08 NOTE — Telephone Encounter (Signed)
Dr. Milinda Antis would you accept this patient?  Dr. Birdie Sons is leaving at the end of the month.

## 2023-10-08 NOTE — Progress Notes (Signed)
Marikay Alar, MD Phone: [phone]  Benjamin Armstrong is a 68 y.o. male who presents today for f/u.  DIABETES Disease Monitoring: Blood Sugar ranges-not checking Polyuria/phagia/dipsia- no      Optho- UTD Medications: Compliance- taking metformin Hypoglycemic symptoms- no  COPD: Medication compliance- stiolto  Rescue inhaler use- rare, only if really needed Dyspnea- with exertion, chronic and stable  Wheezing- with dyspnea  Cough- with dyspnea    Constipation: Patient notes is an ongoing issue.  He has bowel movements daily that has to strain.  He will take a stool softener if he gets really bad.  Rash: Patient notes a rash on his left flank.  Notes it was bigger a week ago.  It has gotten smaller and itches.  He felt as though it may have been related to some kind of bite.  Skin soreness: Patient notes an area of skin soreness along his left flank that has been present for the last few months.  Feels the skin is bruised.  Notes that it is just inferior to his surgical scar from his breast cancer surgery.    Social History   Tobacco Use  Smoking Status Every Day   Current packs/day: 1.00   Average packs/day: 1 pack/day for 53.0 years (53.0 ttl pk-yrs)   Types: Cigarettes  Smokeless Tobacco Never    Current Outpatient Medications on File Prior to Visit  Medication Sig Dispense Refill   albuterol (VENTOLIN HFA) 108 (90 Base) MCG/ACT inhaler INHALE 2 PUFFS BY MOUTH EVERY 6 HOURS AS NEEDED FOR WHEEZE OR SHORTNESS OF BREATH 18 each 0   Bempedoic Acid 180 MG TABS Take 1 tablet (180 mg total) by mouth daily. 30 tablet 3   ezetimibe (ZETIA) 10 MG tablet Take 1 tablet by mouth daily.     Famotidine (PEPCID AC PO) Take 1 tablet by mouth as needed.     fenofibrate (TRICOR) 145 MG tablet Take 1 tablet (145 mg total) by mouth daily. 90 tablet 0   naproxen sodium (ALEVE) 220 MG tablet Take 220 mg by mouth 2 (two) times daily as needed.     potassium chloride SA (KLOR-CON M20) 20 MEQ  tablet Take 1.5 tablets (30 mEq total) by mouth daily. 135 tablet 3   sildenafil (VIAGRA) 100 MG tablet Take 1 tablet (100 mg total) by mouth daily as needed for erectile dysfunction. 30 tablet 11   tamoxifen (NOLVADEX) 20 MG tablet Take 1 tablet (20 mg total) by mouth daily. DO NOT START UNTIL directed by MD. 90 tablet 1   tamsulosin (FLOMAX) 0.4 MG CAPS capsule Take 0.4 mg by mouth.     torsemide (DEMADEX) 20 MG tablet TAKE 3 TABLETS BY MOUTH EVERY DAY 270 tablet 3   No current facility-administered medications on file prior to visit.     ROS see history of present illness  Objective  Physical Exam Vitals:   10/08/23 1301  BP: 124/72  Pulse: 92  Temp: 98.2 F (36.8 C)  SpO2: 96%    BP Readings from Last 3 Encounters:  10/08/23 124/72  09/03/23 121/68  07/04/23 108/64   Wt Readings from Last 3 Encounters:  10/08/23 243 lb (110.2 kg)  09/03/23 247 lb 6.4 oz (112.2 kg)  07/04/23 254 lb 3.2 oz (115.3 kg)    Physical Exam Constitutional:      General: He is not in acute distress.    Appearance: He is not diaphoretic.  Cardiovascular:     Rate and Rhythm: Normal rate and regular rhythm.  Heart sounds: Normal heart sounds.  Pulmonary:     Effort: Pulmonary effort is normal.     Breath sounds: Normal breath sounds.  Chest:    Skin:    General: Skin is warm and dry.  Neurological:     Mental Status: He is alert.     On his left flank, nontender, no induration  Assessment/Plan: Please see individual problem list.  Type 2 diabetes mellitus without complication, without long-term current use of insulin (HCC) Assessment & Plan: Chronic issue.  Check A1c.  Continue metformin 500 mg twice daily.  Orders: -     metFORMIN HCl; Take 1 tablet (500 mg total) by mouth 2 (two) times daily with a meal.  Dispense: 180 tablet; Refill: 1 -     POCT glycosylated hemoglobin (Hb A1C)  Chronic obstructive pulmonary disease, unspecified COPD type (HCC) Assessment &  Plan: Chronic issue.  Patient will continue Stiolto 2 puffs daily.   Rash Assessment & Plan: Undetermined cause.  Potentially could have been related to some kind of insect bite.  Will prescribe triamcinolone to help with the itching.  If not improving he will let us know.  Orders: -     Triamcinolone Acetonide; Apply 1 Application topically 2 (two) times daily.  Dispense: 30 g; Refill: 0  Constipation, unspecified constipation type Assessment & Plan: Chronic issue.  Discussed trying over-the-counter fiber or MiraLAX to see if this would help him have more consistent bowel movements.   Skin sensitivity Assessment & Plan: Possibly related to nerve damage from his prior surgery.  He also had some rib fractures at 1 point and this may be related to that.  At this point he will monitor as there is not much else to do for either of those things unless the pain gets too significant.   Other orders -     EPINEPHrine; Inject 0.3 mg into the muscle as needed for anaphylaxis.  Dispense: 2 each; Refill: 0 -     Stiolto Respimat; Inhale 2 puffs into the lungs daily.  Dispense: 12 g; Refill: 1    Return in about 3 months (around 01/05/2024) for transfer to Dr Milinda Antis .   Marikay Alar, MD [facility]

## 2023-10-08 NOTE — Assessment & Plan Note (Signed)
Chronic issue.  Check A1c.  Continue metformin 500 mg twice daily. 

## 2023-10-08 NOTE — Telephone Encounter (Signed)
This patient has requested to transfer care to Dr Milinda Antis at [facility]. His sister called there today and was told that I would have to initiate the transfer of care request. Can you check on this for this patient and forward to whomever you need to at [facility]? Thanks.

## 2023-10-08 NOTE — Telephone Encounter (Signed)
I am not taking patients over 60 currently

## 2023-10-08 NOTE — Assessment & Plan Note (Signed)
Chronic issue.  Patient will continue Stiolto 2 puffs daily.

## 2023-10-08 NOTE — Assessment & Plan Note (Signed)
Chronic issue.  Discussed trying over-the-counter fiber or MiraLAX to see if this would help him have more consistent bowel movements.

## 2023-10-08 NOTE — Assessment & Plan Note (Signed)
Undetermined cause.  Potentially could have been related to some kind of insect bite.  Will prescribe triamcinolone to help with the itching.  If not improving he will let us know.

## 2023-10-11 NOTE — Telephone Encounter (Signed)
 PAP: Patient assistance application for Stioloto has been approved by PAP Companies: [employer] from 10/09/2023 to 10/08/2024. Medication should be delivered to PAP Delivery: Home. For further shipping updates, please contact [employer] ([employer]) at [phone]. Patient ID is: not provided

## 2023-10-11 NOTE — Progress Notes (Signed)
 Pharmacy Medication Assistance Program Note    10/11/2023  Patient ID: Benjamin Armstrong, male   DOB: 07/24/1956, 68 y.o.   MRN: [id number]     10/05/2023 10/11/2023  Outreach Medication One  Initial Outreach Date (Medication One)  08/09/2023  Manufacturer Medication One [employer]  [employer] Drugs Stiolto Stiolto  Type of Chief Technology Officer Items Requested  Application  Date Application Sent to Prescriber 08/09/2023   Name of Prescriber Camellia Maribeth Camellia Maribeth  Application Items Received From Patient Application Application;Proof of Income  Date Application Submitted to Manufacturer 10/05/2023 10/05/2023  Method Application Sent to Manufacturer Fax Fax  Patient Assistance Determination  Approved  Approval Start Date  10/09/2023  Approval End Date  10/08/2024

## 2023-10-17 ENCOUNTER — Ambulatory Visit
Admission: RE | Admit: 2023-10-17 | Discharge: 2023-10-17 | Disposition: A | Discharge status: Home / Self Care | Payer: Medicare Other | Source: Ambulatory Visit | Attending: Nurse Practitioner

## 2023-10-17 DIAGNOSIS — Z170 Estrogen receptor positive status [ER+]: Secondary | ICD-10-CM

## 2023-10-19 ENCOUNTER — Other Ambulatory Visit (INDEPENDENT_AMBULATORY_CARE_PROVIDER_SITE_OTHER): Payer: Medicare Other | Admitting: Pharmacist

## 2023-10-19 DIAGNOSIS — Z91030 Bee allergy status: Secondary | ICD-10-CM

## 2023-10-19 NOTE — Progress Notes (Signed)
Patient's sister/authorized contact called requesting discussion regarding insurance letter received in the mail: EpiPen is not covered/is not a preferred formulary medication  Patient reports he went to pick up his EpiPen prescription and the pharmacy relayed to him that the cost would be ~$140 so he did not pick it up.   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       ICD-10: Z93.030 - History of bee sting allergy   Payor: MEDICARE / Plan: MEDICARE PART A AND B / Product Type: *No Product type* /    Prior Authorization Submitted via CMM: EPINEPHrine 0.3MG /0.3ML auto-injectors (or generic equivalent) [name] - Pending response   Relayed information to sister. She reports she will discuss with with Benjamin Armstrong.

## 2023-10-23 ENCOUNTER — Ambulatory Visit: Payer: Medicare Other | Admitting: Oncology

## 2023-10-23 ENCOUNTER — Other Ambulatory Visit: Payer: Medicare Other

## 2023-10-24 ENCOUNTER — Other Ambulatory Visit: Payer: Self-pay | Admitting: *Deleted

## 2023-10-24 MED ORDER — BEMPEDOIC ACID 180 MG PO TABS: 180.0000 mg | ORAL_TABLET | Freq: Every day | ORAL | 0 refills | Status: DC

## 2023-10-25 ENCOUNTER — Inpatient Hospital Stay: Payer: Medicare Other

## 2023-10-25 ENCOUNTER — Inpatient Hospital Stay: Payer: Medicare Other | Admitting: Oncology

## 2023-11-06 ENCOUNTER — Ambulatory Visit (INDEPENDENT_AMBULATORY_CARE_PROVIDER_SITE_OTHER): Payer: Medicare Other | Admitting: Urology

## 2023-11-06 VITALS — BP 130/73 | HR 90

## 2023-11-06 DIAGNOSIS — N400 Enlarged prostate without lower urinary tract symptoms: Secondary | ICD-10-CM | POA: Diagnosis not present

## 2023-11-06 DIAGNOSIS — N529 Male erectile dysfunction, unspecified: Secondary | ICD-10-CM | POA: Diagnosis not present

## 2023-11-06 LAB — BLADDER SCAN AMB NON-IMAGING: Scan Result: 0

## 2023-11-06 MED ORDER — SILDENAFIL CITRATE 100 MG PO TABS: 100.0000 mg | ORAL_TABLET | Freq: Every day | ORAL | 11 refills | Status: AC | PRN

## 2023-11-06 MED ORDER — TAMSULOSIN HCL 0.4 MG PO CAPS: 0.4000 mg | ORAL_CAPSULE | Freq: Every day | ORAL | 11 refills | Status: DC
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
## 2023-11-06 NOTE — Progress Notes (Unsigned)
 [name] Kitchen

## 2023-11-08 ENCOUNTER — Ambulatory Visit (INDEPENDENT_AMBULATORY_CARE_PROVIDER_SITE_OTHER): Payer: Medicare Other | Admitting: Podiatry

## 2023-11-08 ENCOUNTER — Encounter: Payer: Self-pay | Admitting: Dermatology

## 2023-11-08 ENCOUNTER — Ambulatory Visit: Payer: Medicare Other | Admitting: Dermatology

## 2023-11-08 ENCOUNTER — Encounter: Payer: Self-pay | Admitting: Podiatry

## 2023-11-08 DIAGNOSIS — L821 Other seborrheic keratosis: Secondary | ICD-10-CM

## 2023-11-08 DIAGNOSIS — L814 Other melanin hyperpigmentation: Secondary | ICD-10-CM | POA: Diagnosis not present

## 2023-11-08 DIAGNOSIS — E119 Type 2 diabetes mellitus without complications: Secondary | ICD-10-CM | POA: Diagnosis not present

## 2023-11-08 DIAGNOSIS — B351 Tinea unguium: Secondary | ICD-10-CM | POA: Diagnosis not present

## 2023-11-08 DIAGNOSIS — L570 Actinic keratosis: Secondary | ICD-10-CM

## 2023-11-08 DIAGNOSIS — M79675 Pain in left toe(s): Secondary | ICD-10-CM | POA: Diagnosis not present

## 2023-11-08 DIAGNOSIS — M79674 Pain in right toe(s): Secondary | ICD-10-CM

## 2023-11-08 DIAGNOSIS — L209 Atopic dermatitis, unspecified: Secondary | ICD-10-CM

## 2023-11-08 DIAGNOSIS — Z86007 Personal history of in-situ neoplasm of skin: Secondary | ICD-10-CM

## 2023-11-08 DIAGNOSIS — D229 Melanocytic nevi, unspecified: Secondary | ICD-10-CM

## 2023-11-08 DIAGNOSIS — L578 Other skin changes due to chronic exposure to nonionizing radiation: Secondary | ICD-10-CM

## 2023-11-08 DIAGNOSIS — Z1283 Encounter for screening for malignant neoplasm of skin: Secondary | ICD-10-CM | POA: Diagnosis not present

## 2023-11-08 DIAGNOSIS — D1801 Hemangioma of skin and subcutaneous tissue: Secondary | ICD-10-CM

## 2023-11-08 DIAGNOSIS — W908XXA Exposure to other nonionizing radiation, initial encounter: Secondary | ICD-10-CM

## 2023-11-08 DIAGNOSIS — Z86018 Personal history of other benign neoplasm: Secondary | ICD-10-CM

## 2023-11-08 NOTE — Patient Instructions (Addendum)

## 2023-11-08 NOTE — Progress Notes (Signed)
 Follow-Up Visit   Subjective  Benjamin Armstrong is a 68 y.o. male who presents for the following: Skin Cancer Screening and Upper Body Skin Exam, place at L side he would like checked, itching at times. Hx SCCIS, hx dysplastic nevus.  The patient presents for Upper Body Skin Exam (UBSE) for skin cancer screening and mole check. The patient has spots, moles and lesions to be evaluated, some may be new or changing and the patient may have concern these could be cancer.   The following portions of the chart were reviewed this encounter and updated as appropriate: medications, allergies, medical history  Review of Systems:  No other skin or systemic complaints except as noted in HPI or Assessment and Plan.  Objective  Well appearing patient in no apparent distress; mood and affect are within normal limits.  All skin waist up examined. Relevant physical exam findings are noted in the Assessment and Plan.  vertex scalp x1, R lobe x1, L mid helix x1, L forearm x3, R forearm x3 (9) Pink scaly macules  Assessment & Plan   AK (ACTINIC KERATOSIS) (9) vertex scalp x1, R lobe x1, L mid helix x1, L forearm x3, R forearm x3 (9) Actinic keratoses are precancerous spots that appear secondary to cumulative UV radiation exposure/sun exposure over time. They are chronic with expected duration over 1 year. A portion of actinic keratoses will progress to squamous cell carcinoma of the skin. It is not possible to reliably predict which spots will progress to skin cancer and so treatment is recommended to prevent development of skin cancer.  Recommend daily broad spectrum sunscreen SPF 30+ to sun-exposed areas, reapply every 2 hours as needed.  Recommend staying in the shade or wearing long sleeves, sun glasses (UVA+UVB protection) and wide brim hats (4-inch brim around the entire circumference of the hat). Call for new or changing lesions. Destruction of lesion - vertex scalp x1, R lobe x1, L mid helix x1, L  forearm x3, R forearm x3 (9) Complexity: simple   Destruction method: cryotherapy   Informed consent: discussed and consent obtained   Timeout:  patient name, date of birth, surgical site, and procedure verified Lesion destroyed using liquid nitrogen: Yes   Region frozen until ice ball extended beyond lesion: Yes   Cryo cycles: 1 or 2. Outcome: patient tolerated procedure well with no complications   Post-procedure details: wound care instructions given   MULTIPLE BENIGN NEVI   LENTIGINES   ACTINIC ELASTOSIS   SEBORRHEIC KERATOSES   CHERRY ANGIOMA   Skin cancer screening performed today.  Actinic Damage - Chronic condition, secondary to cumulative UV/sun exposure - diffuse scaly erythematous macules with underlying dyspigmentation - Recommend daily broad spectrum sunscreen SPF 30+ to sun-exposed areas, reapply every 2 hours as needed.  - Staying in the shade or wearing long sleeves, sun glasses (UVA+UVB protection) and wide brim hats (4-inch brim around the entire circumference of the hat) are also recommended for sun protection.  - Call for new or changing lesions.  Lentigines, Seborrheic Keratoses, Hemangiomas - Benign normal skin lesions - Benign-appearing - Call for any changes  Melanocytic Nevi - Tan-brown and/or pink-flesh-colored symmetric macules and papules - Benign appearing on exam today - Observation - Call clinic for new or changing moles - Recommend daily use of broad spectrum spf 30+ sunscreen to sun-exposed areas.    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  - Call if any new or changing lesions are noted between office visits   HISTORY OF DYSPLASTIC NEVUS Right mid abdomen, moderate atypia No evidence of recurrence  today Recommend regular full body skin exams Recommend daily broad spectrum sunscreen SPF 30+ to sun-exposed areas, reapply every 2 hours as needed.  Call if any new or changing lesions are noted between office visits   ATOPIC DERMATITIS Exam: Scaly red patch at L flank  < 1% BSA  Chronic and persistent condition with duration or expected duration over one year. Condition is bothersome/symptomatic for patient. Currently flared.   Atopic dermatitis (eczema) is a chronic, relapsing, pruritic condition that can significantly affect quality of life. It is often associated with allergic rhinitis and/or asthma and can require treatment with topical medications, phototherapy, or in severe cases biologic injectable medication (Dupixent; Adbry) or Oral JAK inhibitors.  Treatment Plan: Use TMC 0.1% cream BID to aa, L flank until spot clear.   Topical steroids (such as triamcinolone, fluocinolone, fluocinonide, mometasone, clobetasol, halobetasol, betamethasone, hydrocortisone) can cause thinning and lightening of the skin if they are used for too long in the same area. Your physician has selected the right strength medicine for your problem and area affected on the body. Please use your medication only as directed by your physician to prevent side effects.    Recommend gentle skin care.   Return in 6 months (on 05/10/2024) for AK follow-up, w/ Dr. Katrinka Blazing.  [name], CMA, am acting as scribe for Elie Goody, MD .   Documentation: I have reviewed the above documentation for accuracy and completeness, and I agree with the above.  Elie Goody, MD

## 2023-11-13 ENCOUNTER — Telehealth: Payer: Self-pay | Admitting: Family Medicine

## 2023-11-13 NOTE — Progress Notes (Signed)
  Subjective:  Patient ID: Benjamin Armstrong, male    DOB: 12-12-1955,  MRN: [id number]  68 y.o. male presents with preventative diabetic foot care and painful, elongated thickened toenails x 10 which are symptomatic when wearing enclosed shoe gear. This interferes with his/her daily activities.   PCP: Glori Luis, MD (Inactive). LOV 10/08/2023.  New problem(s): None.   Review of Systems: Negative except as noted in the HPI.   Allergies  Allergen Reactions   Bee Venom    Tape Rash    bandaids    Objective:  There were no vitals filed for this visit. Constitutional Patient is a pleasant 68 y.o. male obese in NAD. AAO x 3.  Vascular Capillary fill time to digits <3 seconds.  DP pulse(s) are faintly palpable b/l lower extremities. PT pulses nonpalpable. Pedal hair absent b/l. Lower extremity skin temperature gradient warm to cool b/l. No pain with calf compression b/l. No cyanosis or clubbing noted. No ischemia nor gangrene noted b/l.   Neurologic Protective sensation intact 5/5 intact bilaterally with 10g monofilament b/l. Vibratory sensation intact b/l. No clonus b/l.   Dermatologic Pedal skin is thin, shiny and atrophic b/l.  No open wounds b/l lower extremities. No interdigital macerations b/l lower extremities. Toenails 1-5 b/l elongated, discolored, dystrophic, thickened, crumbly with subungual debris and tenderness to dorsal palpation. No corns, calluses nor porokeratotic lesions noted.  Orthopedic: Normal muscle strength 5/5 to all lower extremity muscle groups bilaterally. No pain, crepitus or joint limitation noted with ROM bilateral LE. No gross bony deformities bilaterally.   Last HgA1c:     Latest Ref Rng & Units 10/08/2023    1:24 PM 07/04/2023    2:03 PM 12/22/2022   12:24 PM  Hemoglobin A1C  Hemoglobin-A1c 4.0 - 5.6 % 6.2  7.3  6.6     Assessment:   1. Pain due to onychomycosis of toenails of both feet   2. Controlled type 2 diabetes mellitus without complication,  without long-term current use of insulin (HCC)    Plan:  Patient was evaluated and treated. All patient's and/or POA's questions/concerns addressed on today's visit. Mycotic toenails 1-5 debrided in length and girth without incident.  Continue daily foot inspections and monitor blood glucose per PCP/Endocrinologist's recommendations.Continue soft, supportive shoe gear daily. Report any pedal injuries to medical professional. Call office if there are any quesitons/concerns. -Patient/POA to call should there be question/concern in the interim.  Return in about 3 months (around 02/08/2024).  Freddie Breech, DPM      [facility] LOCATION: [address] [place] [address]                   Office [phone]   [facility] LOCATION: [address] [place], [place] [address] Office [phone]

## 2023-11-13 NOTE — Telephone Encounter (Signed)
 Noted.

## 2023-11-13 NOTE — Telephone Encounter (Signed)
 Called pt to call back and get appointment for a TOC slot due to provider Dr. Birdie Sons has left our office.

## 2023-11-15 ENCOUNTER — Other Ambulatory Visit: Payer: Self-pay | Admitting: *Deleted

## 2023-11-15 DIAGNOSIS — R918 Other nonspecific abnormal finding of lung field: Secondary | ICD-10-CM

## 2023-11-15 DIAGNOSIS — C50822 Malignant neoplasm of overlapping sites of left male breast: Secondary | ICD-10-CM

## 2023-11-16 ENCOUNTER — Inpatient Hospital Stay: Payer: Medicare Other | Attending: Internal Medicine

## 2023-11-16 ENCOUNTER — Inpatient Hospital Stay (HOSPITAL_BASED_OUTPATIENT_CLINIC_OR_DEPARTMENT_OTHER): Payer: Medicare Other | Admitting: Oncology

## 2023-11-16 ENCOUNTER — Encounter: Payer: Self-pay | Admitting: Oncology

## 2023-11-16 VITALS — BP 129/66 | HR 75 | Temp 97.6°F | Resp 18 | Ht 74.0 in | Wt 241.0 lb

## 2023-11-16 DIAGNOSIS — C50922 Malignant neoplasm of unspecified site of left male breast: Secondary | ICD-10-CM | POA: Insufficient documentation

## 2023-11-16 DIAGNOSIS — Z170 Estrogen receptor positive status [ER+]: Secondary | ICD-10-CM

## 2023-11-16 DIAGNOSIS — R918 Other nonspecific abnormal finding of lung field: Secondary | ICD-10-CM

## 2023-11-16 DIAGNOSIS — F17210 Nicotine dependence, cigarettes, uncomplicated: Secondary | ICD-10-CM | POA: Diagnosis not present

## 2023-11-16 DIAGNOSIS — N400 Enlarged prostate without lower urinary tract symptoms: Secondary | ICD-10-CM

## 2023-11-16 DIAGNOSIS — C50822 Malignant neoplasm of overlapping sites of left male breast: Secondary | ICD-10-CM

## 2023-11-16 DIAGNOSIS — Z1721 Progesterone receptor positive status: Secondary | ICD-10-CM | POA: Diagnosis not present

## 2023-11-16 DIAGNOSIS — Z1732 Human epidermal growth factor receptor 2 negative status: Secondary | ICD-10-CM | POA: Insufficient documentation

## 2023-11-16 DIAGNOSIS — Z79810 Long term (current) use of selective estrogen receptor modulators (SERMs): Secondary | ICD-10-CM | POA: Diagnosis not present

## 2023-11-16 LAB — CBC WITH DIFFERENTIAL/PLATELET
Abs Immature Granulocytes: 0.04 10*3/uL (ref 0.00–0.07)
Basophils Absolute: 0.1 10*3/uL (ref 0.0–0.1)
Basophils Relative: 1 %
Eosinophils Absolute: 0.3 10*3/uL (ref 0.0–0.5)
Eosinophils Relative: 4 %
HCT: 47 % (ref 39.0–52.0)
Hemoglobin: 15.7 g/dL (ref 13.0–17.0)
Immature Granulocytes: 1 %
Lymphocytes Relative: 20 %
Lymphs Abs: 1.5 10*3/uL (ref 0.7–4.0)
MCH: 30.5 pg (ref 26.0–34.0)
MCHC: 33.4 g/dL (ref 30.0–36.0)
MCV: 91.3 fL (ref 80.0–100.0)
Monocytes Absolute: 0.9 10*3/uL (ref 0.1–1.0)
Monocytes Relative: 12 %
Neutro Abs: 4.6 10*3/uL (ref 1.7–7.7)
Neutrophils Relative %: 62 %
Platelets: 206 10*3/uL (ref 150–400)
RBC: 5.15 MIL/uL (ref 4.22–5.81)
RDW: 13.3 % (ref 11.5–15.5)
WBC: 7.5 10*3/uL (ref 4.0–10.5)
nRBC: 0 % (ref 0.0–0.2)

## 2023-11-16 LAB — CMP (CANCER CENTER ONLY)
ALT: 38 U/L (ref 0–44)
AST: 42 U/L — ABNORMAL HIGH (ref 15–41)
Albumin: 3.7 g/dL (ref 3.5–5.0)
Alkaline Phosphatase: 34 U/L — ABNORMAL LOW (ref 38–126)
Anion gap: 12 (ref 5–15)
BUN: 17 mg/dL (ref 8–23)
CO2: 24 mmol/L (ref 22–32)
Calcium: 8.9 mg/dL (ref 8.9–10.3)
Chloride: 98 mmol/L (ref 98–111)
Creatinine: 1.09 mg/dL (ref 0.61–1.24)
GFR, Estimated: >60 mL/min (ref >60)
Glucose, Bld: 105 mg/dL — ABNORMAL HIGH (ref 70–99)
Potassium: 3.6 mmol/L (ref 3.5–5.1)
Sodium: 134 mmol/L — ABNORMAL LOW (ref 135–145)
Total Bilirubin: 0.6 mg/dL (ref 0.0–1.2)
Total Protein: 6.7 g/dL (ref 6.5–8.1)

## 2023-11-16 LAB — PSA: Prostatic Specific Antigen: 1.46 ng/mL (ref 0.00–4.00)
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
## 2023-11-20 ENCOUNTER — Other Ambulatory Visit: Payer: Self-pay

## 2023-11-20 MED ORDER — BEMPEDOIC ACID 180 MG PO TABS: 180.0000 mg | ORAL_TABLET | Freq: Every day | ORAL | 0 refills | Status: DC

## 2023-11-21 ENCOUNTER — Encounter: Payer: Self-pay | Admitting: Urology

## 2023-11-21 MED ORDER — TAMSULOSIN HCL 0.4 MG PO CAPS: 0.4000 mg | ORAL_CAPSULE | Freq: Every day | ORAL | 3 refills | Status: AC

## 2023-11-23 ENCOUNTER — Ambulatory Visit: Payer: Medicare Other | Attending: Cardiology | Admitting: Cardiology

## 2023-11-23 ENCOUNTER — Encounter: Payer: Self-pay | Admitting: Cardiology

## 2023-11-23 VITALS — BP 104/68 | HR 87 | Ht 74.0 in | Wt 243.4 lb

## 2023-11-23 DIAGNOSIS — E782 Mixed hyperlipidemia: Secondary | ICD-10-CM | POA: Diagnosis present

## 2023-11-23 DIAGNOSIS — R600 Localized edema: Secondary | ICD-10-CM | POA: Insufficient documentation

## 2023-11-23 DIAGNOSIS — Z720 Tobacco use: Secondary | ICD-10-CM | POA: Insufficient documentation

## 2023-11-23 NOTE — Patient Instructions (Signed)
 Medication Instructions:  No changes *If you need a refill on your cardiac medications before your next appointment, please call your pharmacy*  Lab Work: No labs If you have labs (blood work) drawn today and your tests are completely normal, you will receive your results only by: MyChart Message (if you have MyChart) OR A paper copy in the mail If you have any lab test that is abnormal or we need to change your treatment, we will call you to review the results  Testing/Procedures: No testing  Follow-Up: At [facility], you and your health needs are our priority.  As part of our continuing mission to provide you with exceptional heart care, we have created designated Provider Care Teams.  These Care Teams include your primary Cardiologist (physician) and Advanced Practice Providers (APPs -  Physician Assistants and Nurse Practitioners) who all work together to provide you with the care you need, when you need it.  We recommend signing up for the patient portal called "MyChart".  Sign up information is provided on this After Visit Summary.  MyChart is used to connect with patients for Virtual Visits (Telemedicine).  Patients are able to view lab/test results, encounter notes, upcoming appointments, etc.  Non-urgent messages can be sent to your provider as well.   To learn more about what you can do with MyChart, go to [email or web address].    Your next appointment:   6 month(s)  Provider:   Debbe Odea, MD
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
## 2023-12-06 ENCOUNTER — Other Ambulatory Visit: Payer: Self-pay | Admitting: Cardiology

## 2023-12-11 ENCOUNTER — Ambulatory Visit: Admitting: Physician Assistant

## 2023-12-24 ENCOUNTER — Other Ambulatory Visit: Payer: Self-pay

## 2023-12-24 MED ORDER — FENOFIBRATE 145 MG PO TABS: 145.0000 mg | ORAL_TABLET | Freq: Every day | ORAL | 0 refills | Status: DC

## 2024-01-03 ENCOUNTER — Other Ambulatory Visit: Payer: Self-pay

## 2024-01-03 MED ORDER — FENOFIBRATE 145 MG PO TABS: 145.0000 mg | ORAL_TABLET | Freq: Every day | ORAL | 0 refills | Status: DC

## 2024-01-03 MED ORDER — BEMPEDOIC ACID 180 MG PO TABS: 180.0000 mg | ORAL_TABLET | Freq: Every day | ORAL | 5 refills | Status: DC

## 2024-01-07 ENCOUNTER — Encounter: Payer: Self-pay | Admitting: Nurse Practitioner

## 2024-01-07 ENCOUNTER — Ambulatory Visit (INDEPENDENT_AMBULATORY_CARE_PROVIDER_SITE_OTHER): Payer: Medicare Other | Admitting: Nurse Practitioner

## 2024-01-07 VITALS — BP 100/62 | HR 79 | Temp 98.0°F | Ht 74.0 in | Wt 240.4 lb

## 2024-01-07 DIAGNOSIS — I10 Essential (primary) hypertension: Secondary | ICD-10-CM

## 2024-01-07 DIAGNOSIS — J449 Chronic obstructive pulmonary disease, unspecified: Secondary | ICD-10-CM | POA: Diagnosis not present

## 2024-01-07 DIAGNOSIS — E119 Type 2 diabetes mellitus without complications: Secondary | ICD-10-CM | POA: Diagnosis not present

## 2024-01-07 DIAGNOSIS — Z7689 Persons encountering health services in other specified circumstances: Secondary | ICD-10-CM

## 2024-01-07 DIAGNOSIS — N400 Enlarged prostate without lower urinary tract symptoms: Secondary | ICD-10-CM | POA: Diagnosis not present

## 2024-01-07 DIAGNOSIS — Z1211 Encounter for screening for malignant neoplasm of colon: Secondary | ICD-10-CM

## 2024-01-07 DIAGNOSIS — E782 Mixed hyperlipidemia: Secondary | ICD-10-CM

## 2024-01-07 DIAGNOSIS — Z7984 Long term (current) use of oral hypoglycemic drugs: Secondary | ICD-10-CM

## 2024-01-07 DIAGNOSIS — Z720 Tobacco use: Secondary | ICD-10-CM

## 2024-01-07 LAB — POCT GLYCOSYLATED HEMOGLOBIN (HGB A1C): Hemoglobin A1C: 6.4 % — AB (ref 4.0–5.6)

## 2024-01-07 NOTE — Assessment & Plan Note (Signed)
 New patient to me. Brief review of EMR.

## 2024-01-07 NOTE — Progress Notes (Signed)
 Established Patient Office Visit  Subjective   Patient ID: Benjamin Armstrong, male    DOB: June 23, 1956  Age: 68 y.o. MRN: [id number]  Chief Complaint  Patient presents with   Transitions Of Care    Declines vaccines.     HPI  DM2: currently maintaoned on metformin  500mg  BID. Does not check sugar at home. States that he  COPD: currently on stiolto and albuterol . He is still smoking. He uses the rescue inhaler approx 1-2 a week.  HLD: on bempedoic acid , zetia , fenofibrate  and followed by cardiology Dr Junnie Olives.  NPH: on flomax  most recent PSA is followed bu urlogy. States that he gets 1-2 times a night    Breast carcinoma: followed by oncology. On tomoxifen. And did radiation in the past along with mastectomy  Colonoscopy: cologuard in 2021 negative PSA: 11/16/2023, followed by urology    Review of Systems  Constitutional:  Negative for chills and fever.  Respiratory:  Positive for shortness of breath.   Cardiovascular:  Negative for chest pain.  Gastrointestinal:  Positive for constipation. Negative for abdominal pain, diarrhea, nausea and vomiting.       Bm daily   Neurological:  Positive for dizziness. Negative for headaches.  Psychiatric/Behavioral:  Negative for hallucinations and suicidal ideas.       Objective:     BP 100/62   Pulse 79   Temp 98 F (36.7 C) (Oral)   Ht 6\' 2"  (1.88 m)   Wt 240 lb 6.4 oz (109 kg)   SpO2 94%   BMI 30.87 kg/m  BP Readings from Last 3 Encounters:  01/07/24 100/62  11/23/23 104/68  11/16/23 129/66   Wt Readings from Last 3 Encounters:  01/07/24 240 lb 6.4 oz (109 kg)  11/23/23 243 lb 6.4 oz (110.4 kg)  11/16/23 241 lb (109.3 kg)   SpO2 Readings from Last 3 Encounters:  01/07/24 94%  11/23/23 94%  11/16/23 96%      Physical Exam Vitals and nursing note reviewed.  Constitutional:      Appearance: Normal appearance.  HENT:     Right Ear: Tympanic membrane, ear canal and external ear normal.     Left Ear:  Tympanic membrane, ear canal and external ear normal.     Mouth/Throat:     Mouth: Mucous membranes are moist.     Pharynx: Oropharynx is clear.  Eyes:     Extraocular Movements: Extraocular movements intact.     Pupils: Pupils are equal, round, and reactive to light.  Cardiovascular:     Rate and Rhythm: Normal rate and regular rhythm.     Pulses: Normal pulses.     Heart sounds: Normal heart sounds.  Pulmonary:     Effort: Pulmonary effort is normal.     Breath sounds: Normal breath sounds.  Abdominal:     General: Bowel sounds are normal. There is no distension.     Palpations: There is no mass.     Tenderness: There is no abdominal tenderness.     Hernia: No hernia is present.  Musculoskeletal:     Right lower leg: Edema (R>L) present.     Left lower leg: Edema present.  Lymphadenopathy:     Cervical: No cervical adenopathy.  Skin:    General: Skin is warm.  Neurological:     General: No focal deficit present.     Mental Status: He is alert.     Deep Tendon Reflexes:     Reflex Scores:  Bicep reflexes are 2+ on the right side and 2+ on the left side.      Patellar reflexes are 2+ on the right side and 2+ on the left side.    Comments: Bilateral upper and lower extremity strength 5/5  Psychiatric:        Mood and Affect: Mood normal.        Behavior: Behavior normal.        Thought Content: Thought content normal.        Judgment: Judgment normal.      Results for orders placed or performed in visit on 01/07/24  POCT glycosylated hemoglobin (Hb A1C)  Result Value Ref Range   Hemoglobin A1C 6.4 (A) 4.0 - 5.6 %   HbA1c POC (<> result, manual entry)     HbA1c, POC (prediabetic range)     HbA1c, POC (controlled diabetic range)        The 10-year ASCVD risk score (Arnett DK, et al., 2019) is: 33.6%    Assessment & Plan:   Problem List Items Addressed This Visit       Cardiovascular and Mediastinum   Essential hypertension (Chronic)   Patient blood  pressure well-controlled.  He is maintained on 60 mg of torsemide  daily along with 30 mEq of potassium daily.  Continue        Respiratory   COPD (chronic obstructive pulmonary disease) (HCC)   Currently maintained on Stiolto and albuterol  as needed.  Seems controlled.  Patient still smokes.  Continue inhalers as prescribed        Endocrine   Type 2 diabetes mellitus (HCC) - Primary (Chronic)   Patient currently on metformin  500 mg twice daily glucose well-controlled with A1c of 6.4% today.  Continue medication as prescribed and work on lifestyle modifications      Relevant Orders   POCT glycosylated hemoglobin (Hb A1C) (Completed)     Genitourinary   BPH (benign prostatic hyperplasia)   History of the same he is on flomax  and followed by urology. Continue medications as prescribed and follow with specialist        Other   Tobacco abuse (Chronic)   Still smoking He recently had a CT of the chest. COPD      Hyperlipidemia (Chronic)   Followed by cardiology on bempedoic acid , zeita, fenofibrate .  Continue take medications and followed up with cardiology as recommended        Encounter to establish care   New patient to me. Brief review of EMR.       Other Visit Diagnoses       Screening for colon cancer       Relevant Orders   Cologuard       Return in about 6 months (around 07/09/2024) for "CPE" and labs.    Margarie Shay, NP

## 2024-01-07 NOTE — Assessment & Plan Note (Signed)
 Patient currently on metformin  500 mg twice daily glucose well-controlled with A1c of 6.4% today.  Continue medication as prescribed and work on lifestyle modifications

## 2024-01-07 NOTE — Assessment & Plan Note (Signed)
 History of the same he is on flomax  and followed by urology. Continue medications as prescribed and follow with specialist

## 2024-01-07 NOTE — Patient Instructions (Signed)
 Nice to see you today I want to see you in 6 months for you physical and labs, sooner if you need me

## 2024-01-07 NOTE — Assessment & Plan Note (Signed)
 Currently maintained on Stiolto and albuterol  as needed.  Seems controlled.  Patient still smokes.  Continue inhalers as prescribed

## 2024-01-07 NOTE — Assessment & Plan Note (Signed)
 Still smoking He recently had a CT of the chest. COPD

## 2024-01-07 NOTE — Assessment & Plan Note (Signed)
 Followed by cardiology on bempedoic acid , zeita, fenofibrate .  Continue take medications and followed up with cardiology as recommended

## 2024-01-07 NOTE — Assessment & Plan Note (Signed)
 Patient blood pressure well-controlled.  He is maintained on 60 mg of torsemide  daily along with 30 mEq of potassium daily.  Continue

## 2024-02-07 ENCOUNTER — Ambulatory Visit (INDEPENDENT_AMBULATORY_CARE_PROVIDER_SITE_OTHER): Admitting: Podiatry

## 2024-02-07 ENCOUNTER — Encounter: Payer: Self-pay | Admitting: Podiatry

## 2024-02-07 DIAGNOSIS — M79675 Pain in left toe(s): Secondary | ICD-10-CM | POA: Diagnosis not present

## 2024-02-07 DIAGNOSIS — E119 Type 2 diabetes mellitus without complications: Secondary | ICD-10-CM

## 2024-02-07 DIAGNOSIS — B351 Tinea unguium: Secondary | ICD-10-CM

## 2024-02-07 DIAGNOSIS — M79674 Pain in right toe(s): Secondary | ICD-10-CM

## 2024-02-15 NOTE — Progress Notes (Signed)
  Subjective:  Patient ID: Benjamin Armstrong, male    DOB: 07-08-1956,  MRN: [id number]  Trevaughn Schear presents to clinic today for preventative diabetic foot care and painful elongated mycotic toenails 1-5 bilaterally which are tender when wearing enclosed shoe gear. Pain is relieved with periodic professional debridement. He is accompanied by his wife on today's visit. Patient states right great toe is tender.  New problem(s): None.   PCP is Dorothe Gaster, NP. Holli Lunger 01/07/2024.  Allergies  Allergen Reactions   Bee Venom    Tape Rash    bandaids    Review of Systems: Negative except as noted in the HPI.  Objective: No changes noted in today's physical examination. There were no vitals filed for this visit. Jayden Kratochvil is a pleasant 68 y.o. male in NAD. AAO x 3.  Vascular Examination: CFT <3 seconds b/l. DP pulses faintly palpable b/l. PT pulses nonpalpable b/l. Digital hair absent. Skin temperature gradient warm to warm b/l. No pain with calf compression. No ischemia or gangrene. No cyanosis or clubbing noted b/l.    Neurological Examination: Sensation grossly intact b/l with 10 gram monofilament. Vibratory sensation intact b/l.   Dermatological Examination: Pedal skin warm and supple b/l. No open wounds b/l. No interdigital macerations. Toenails 1-5 b/l thick, discolored, elongated with subungual debris and pain on dorsal palpation.  No hyperkeratotic nor porokeratotic lesions present on today's visit.  Musculoskeletal Examination: Muscle strength 5/5 to all lower extremity muscle groups bilaterally. No pain, crepitus or joint limitation noted with ROM bilateral LE. No gross bony deformities bilaterally.  Radiographs: None  Last HgA1c:      Latest Ref Rng & Units 01/07/2024   10:39 AM 10/08/2023    1:24 PM 07/04/2023    2:03 PM  Hemoglobin A1C  Hemoglobin-A1c 4.0 - 5.6 % 6.4  6.2  7.3    Assessment/Plan: 1. Pain due to onychomycosis of toenails of both feet   2. Controlled  type 2 diabetes mellitus without complication, without long-term current use of insulin (HCC)   Consent given for treatment. Patient examined. All patient's and/or POA's questions/concerns addressed on today's visit. Toenails 1-5 debrided in length and girth without incident. Continue foot and shoe inspections daily. Monitor blood glucose per PCP/Endocrinologist's recommendations. Continue soft, supportive shoe gear daily. Report any pedal injuries to medical professional. Call office if there are any questions/concerns.  Return in about 3 months (around 05/09/2024).  Luella Sager, DPM      [place] LOCATION: [address] [place] [address]                   Office [phone]   [facility] LOCATION: [address] [place], [place] [address] Office [phone]

## 2024-03-12 ENCOUNTER — Other Ambulatory Visit (HOSPITAL_BASED_OUTPATIENT_CLINIC_OR_DEPARTMENT_OTHER): Payer: Self-pay

## 2024-03-31 ENCOUNTER — Telehealth: Payer: Self-pay | Admitting: *Deleted

## 2024-03-31 MED ORDER — TAMOXIFEN CITRATE 20 MG PO TABS: 20.0000 mg | ORAL_TABLET | Freq: Every day | ORAL | 1 refills | Status: DC

## 2024-03-31 NOTE — Telephone Encounter (Signed)
 The sister of the patient called today stating that the tamoxifen  needs to be sent in because its RX through mail to his house usually about 7 to 10 days to get it and he has 15 pills at  today. 90 day supply

## 2024-04-01 ENCOUNTER — Other Ambulatory Visit: Payer: Self-pay | Admitting: Cardiology

## 2024-04-01 ENCOUNTER — Telehealth: Payer: Self-pay | Admitting: Nurse Practitioner

## 2024-04-01 NOTE — Telephone Encounter (Signed)
 Dropped off ppwk for License plate disability to be completed by provider, and placed in provider front box at front desk

## 2024-04-07 ENCOUNTER — Telehealth: Payer: Self-pay | Admitting: Nurse Practitioner

## 2024-04-07 NOTE — Telephone Encounter (Signed)
 Copied from CRM [id number]. Topic: General - Other >> Apr 07, 2024  9:19 AM Elle L wrote: Reason for CRM: The patient's sister was calling regarding paperwork for the patient. Advised per chart notes, Dropped off ppwk for License plate disability to be completed by provider, and placed in provider front box at front desk and that he would be contacted once completed. However, she wanted to make the office aware that his temporary tag runs out tomorrow.

## 2024-04-08 ENCOUNTER — Encounter: Payer: Self-pay | Admitting: Nurse Practitioner

## 2024-04-08 NOTE — Telephone Encounter (Signed)
 Pt's sister, June came in and picked up form.

## 2024-04-08 NOTE — Telephone Encounter (Signed)
 Paperwork has been completed and placed in the outgoing MA box

## 2024-04-08 NOTE — Telephone Encounter (Signed)
 Ppw at front desk for pick up. Pt notified via mychart.

## 2024-04-14 ENCOUNTER — Ambulatory Visit: Admitting: *Deleted

## 2024-04-14 VITALS — Ht 74.0 in | Wt 240.0 lb

## 2024-04-14 DIAGNOSIS — Z0000 Encounter for general adult medical examination without abnormal findings: Secondary | ICD-10-CM

## 2024-04-14 NOTE — Patient Instructions (Addendum)
 Benjamin Armstrong , Thank you for taking time out of your busy schedule to complete your Annual Wellness Visit with me. I enjoyed our conversation and look forward to speaking with you again next year. I, as well as your care team,  appreciate your ongoing commitment to your health goals. Please review the following plan we discussed and let me know if I can assist you in the future. Your Game plan/ To Do List    Referrals: If you haven't heard from the office you've been referred to, please reach out to them at the phone provided.   Remember to complete and return your cologuard test. Consider updating your vaccines.   Follow up Visits: We will see or speak with you next year for your Next Medicare AWV with our clinical staff 04/16/25 @ 1:00 Have you seen your provider in the last 6 months (3 months if uncontrolled diabetes)? Yes  Clinician Recommendations:  Aim for 30 minutes of exercise or brisk walking, 6-8 glasses of water, and 5 servings of fruits and vegetables each day.       This is a list of the screenings recommended for you:  Health Maintenance  Topic Date Due   Yearly kidney health urinalysis for diabetes  Never done   DTaP/Tdap/Td vaccine (1 - Tdap) Never done   Pneumococcal Vaccine for age over 38 (1 of 2 - PCV) Never done   Cologuard (Stool DNA test)  05/26/2023   Flu Shot  04/04/2024   Complete foot exam   04/11/2024   Hemoglobin A1C  07/09/2024   Eye exam for diabetics  08/14/2024   Screening for Lung Cancer  10/16/2024   Yearly kidney function blood test for diabetes  11/15/2024   Medicare Annual Wellness Visit  04/14/2025   Hepatitis C Screening  Completed   Hepatitis B Vaccine  Aged Out   HPV Vaccine  Aged Out   Meningitis B Vaccine  Aged Out   COVID-19 Vaccine  Discontinued   Zoster (Shingles) Vaccine  Discontinued    Advanced directives: (Declined) Advance directive discussed with you today. Even though you declined this today, please call our office should you  change your mind, and we can give you the proper paperwork for you to fill out. Advance Care Planning is important because it:  [x]  Makes sure you receive the medical care that is consistent with your values, goals, and preferences  [x]  It provides guidance to your family and loved ones and reduces their decisional burden about whether or not they are making the right decisions based on your wishes.
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
## 2024-04-22 ENCOUNTER — Ambulatory Visit: Admitting: Internal Medicine

## 2024-04-22 ENCOUNTER — Ambulatory Visit: Payer: Self-pay

## 2024-04-22 NOTE — Telephone Encounter (Signed)
 FYI Only or Action Required?: Action required by provider: request for appointment.  Patient was last seen in primary care on 01/07/2024 by Wendee Lynwood HERO, NP.  Called Nurse Triage reporting No chief complaint on file..  Symptoms began several days ago.  Interventions attempted: Nothing.  Symptoms are: gradually worsening.  Triage Disposition: See HCP Within 4 Hours (Or PCP Triage)  Patient/caregiver understands and will follow disposition?: YesCopied from CRM #[id number]. Topic: Clinical - Red Word Triage >> Apr 22, 2024 10:13 AM Henretta I wrote: Red Word that prompted transfer to Nurse Triage: Patients sister June called on his behalf as patients bellybutton is swollen and sore to touch. Reason for Disposition  Nursing judgment or information in reference  Answer Assessment - Initial Assessment Questions Sister june called on behalf of pt. She was thinking this is from a tick but but didn't see a tick/bit mark. Red, swollen belly button. Area is hard above belly button     1. REASON FOR CALL: What is your main concern right now?     Belly button pain 2. ONSET: When did the  start?     2 days ago   4. FUNCTIONAL IMPAIRMENT: How have things been going for you overall? Have you had more difficulty than usual doing your normal daily activities? (e.g., self-care, school, work, interactions)     denies 5. RELIEVING AND AGGRAVATING FACTORS: What makes it better or worse? (e.g., certain activities, rest)    Worse today 6. FEVER: Do you have a fever?     na 7. OTHER SYMPTOMS: Do you have any other new symptoms?     denies 8. TREATMENTS AND RESPONSE: What have you done so far to try to make this better? What medicines have you used?     No treatment  Protocols used: No Guideline Available-A-AH

## 2024-04-22 NOTE — Telephone Encounter (Signed)
 I spoke with June (DPR signed) pt already has appt to 04/23/24 at 11:30 and June said that she has other obligations for today and pt told June he could not come in for appt this morning. Pt plans on keeping appt with Dr Jimmy on 04/23/24 with UC & ED precautions that June voice understanding. sending note to Dr Jimmy.

## 2024-04-22 NOTE — Telephone Encounter (Signed)
 Noted I will assess at tomorrow's visit I would recommend warm compresses for now

## 2024-04-22 NOTE — Telephone Encounter (Signed)
 Spoke to pt.

## 2024-04-23 ENCOUNTER — Ambulatory Visit (INDEPENDENT_AMBULATORY_CARE_PROVIDER_SITE_OTHER): Admitting: Internal Medicine

## 2024-04-23 ENCOUNTER — Encounter: Payer: Self-pay | Admitting: Internal Medicine

## 2024-04-23 VITALS — BP 118/82 | HR 73 | Temp 98.4°F | Ht 74.0 in | Wt 236.0 lb

## 2024-04-23 DIAGNOSIS — L03316 Cellulitis of umbilicus: Secondary | ICD-10-CM | POA: Insufficient documentation

## 2024-04-23 MED ORDER — AMOXICILLIN-POT CLAVULANATE 875-125 MG PO TABS
1.0000 | ORAL_TABLET | Freq: Two times a day (BID) | ORAL | 0 refills | Status: DC
Start: 2024-04-23 — End: 2024-05-27

## 2024-04-23 NOTE — Assessment & Plan Note (Signed)
 No obvious skin break No hernia Discussed continuing warm compresses to see if he gets any drainage Augmentin  875 bid x 7 days
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
## 2024-05-02 ENCOUNTER — Other Ambulatory Visit: Payer: Self-pay | Admitting: Cardiology

## 2024-05-12 ENCOUNTER — Encounter: Payer: Self-pay | Admitting: Dermatology

## 2024-05-12 ENCOUNTER — Ambulatory Visit (INDEPENDENT_AMBULATORY_CARE_PROVIDER_SITE_OTHER): Admitting: Dermatology

## 2024-05-12 DIAGNOSIS — W908XXA Exposure to other nonionizing radiation, initial encounter: Secondary | ICD-10-CM

## 2024-05-12 DIAGNOSIS — L570 Actinic keratosis: Secondary | ICD-10-CM | POA: Diagnosis not present

## 2024-05-12 NOTE — Progress Notes (Signed)
   Follow-Up Visit   Subjective  Benjamin Armstrong is a 68 y.o. male who presents for the following: 6 month AK follow up at forearms, scalp and ears.    The following portions of the chart were reviewed this encounter and updated as appropriate: medications, allergies, medical history  Review of Systems:  No other skin or systemic complaints except as noted in HPI or Assessment and Plan.  Objective  Well appearing patient in no apparent distress; mood and affect are within normal limits.   A focused examination was performed of the following areas: Face, scalp, ears, arms  Relevant exam findings are noted in the Assessment and Plan.  L forearm x 7, L frontal scalp x 1, R forearm x 4 (12) Erythematous thin papules/macules with gritty scale.   Assessment & Plan     AK (ACTINIC KERATOSIS) (12) L forearm x 7, L frontal scalp x 1, R forearm x 4 (12) Actinic keratoses are precancerous spots that appear secondary to cumulative UV radiation exposure/sun exposure over time. They are chronic with expected duration over 1 year. A portion of actinic keratoses will progress to squamous cell carcinoma of the skin. It is not possible to reliably predict which spots will progress to skin cancer and so treatment is recommended to prevent development of skin cancer.  Recommend daily broad spectrum sunscreen SPF 30+ to sun-exposed areas, reapply every 2 hours as needed.  Recommend staying in the shade or wearing long sleeves, sun glasses (UVA+UVB protection) and wide brim hats (4-inch brim around the entire circumference of the hat). Call for new or changing lesions.  Patient defers treating with 5FU/calcipotriene Destruction of lesion - L forearm x 7, L frontal scalp x 1, R forearm x 4 (12) Complexity: simple   Destruction method: cryotherapy   Informed consent: discussed and consent obtained   Timeout:  patient name, date of birth, surgical site, and procedure verified Lesion destroyed using  liquid nitrogen: Yes   Region frozen until ice ball extended beyond lesion: Yes   Cryo cycles: 1 or 2. Outcome: patient tolerated procedure well with no complications   Post-procedure details: wound care instructions given     Return in about 6 months (around 11/09/2024) for with Dr. Claudene, UBSE, HxDN, HxSCCis, HxAK.  LILLETTE Lonell Drones, RMA, am acting as scribe for Boneta Claudene, MD .   Documentation: I have reviewed the above documentation for accuracy and completeness, and I agree with the above.  Boneta Claudene, MD

## 2024-05-12 NOTE — Patient Instructions (Signed)

## 2024-05-15 ENCOUNTER — Encounter: Payer: Self-pay | Admitting: Podiatry

## 2024-05-15 ENCOUNTER — Ambulatory Visit: Admitting: Podiatry

## 2024-05-15 DIAGNOSIS — E119 Type 2 diabetes mellitus without complications: Secondary | ICD-10-CM

## 2024-05-15 DIAGNOSIS — M79675 Pain in left toe(s): Secondary | ICD-10-CM | POA: Diagnosis not present

## 2024-05-15 DIAGNOSIS — M79674 Pain in right toe(s): Secondary | ICD-10-CM | POA: Diagnosis not present

## 2024-05-15 DIAGNOSIS — B351 Tinea unguium: Secondary | ICD-10-CM

## 2024-05-18 ENCOUNTER — Encounter: Payer: Self-pay | Admitting: Podiatry

## 2024-05-18 NOTE — Progress Notes (Signed)
  Subjective:  Patient ID: Benjamin Armstrong, male    DOB: 1956-04-29,  MRN: [id number]  Benjamin Armstrong presents to clinic today for for annual diabetic foot examination  Chief Complaint  Patient presents with   [facility]    Rm4 Diabetic foot care Dr. Lynwood Crandall last visit Jan 07, 2024/ A1C 6.4    New problem(s): None.   PCP is Crandall Lynwood HERO, NP.  Allergies  Allergen Reactions   Bee Venom    Tape Rash    bandaids    Review of Systems: Negative except as noted in the HPI.  Objective: No changes noted in today's physical examination. There were no vitals filed for this visit. Benjamin Armstrong is a pleasant 68 y.o. male in NAD. AAO x 3.   Diabetic foot exam was performed with the following findings:   Vascular Examination: CFT <3 seconds b/l. DP pulses faintly palpable b/l. PT pulses nonpalpable b/l. Digital hair absent. Skin temperature gradient warm to warm b/l. No pain with calf compression. No ischemia or gangrene. No cyanosis or clubbing noted b/l.    Neurological Examination: Sensation grossly intact b/l with 10 gram monofilament. Vibratory sensation intact b/l.   Dermatological Examination: Pedal skin warm and supple b/l. No open wounds b/l. No interdigital macerations. Toenails 1-5 b/l thick, discolored, elongated with subungual debris and pain on dorsal palpation.  No corns, calluses, nor porokeratotic lesions.   Musculoskeletal Examination: Muscle strength 5/5 to all lower extremity muscle groups bilaterally. No pain, crepitus or joint limitation noted with ROM bilateral LE. No gross bony deformities bilaterally.  Radiographs: None    Assessment/Plan: 1. Pain due to onychomycosis of toenails of both feet   2. Controlled type 2 diabetes mellitus without complication, without long-term current use of insulin (HCC)   3. Encounter for diabetic foot exam (HCC)   Diabetic foot examination performed today.  All patient's and/or POA's questions/concerns addressed on today's visit.  Mycotic toenails 1-5 debrided in length and girth without incident. Continue daily foot inspections and monitor blood glucose per PCP/Endocrinologist's recommendations. Continue soft, supportive shoe gear daily. Report any pedal injuries to medical professional. Call office if there are any questions/concerns. -Patient/POA to call should there be question/concern in the interim.   Return in about 3 months (around 08/14/2024).  Benjamin Armstrong, DPM      [facility] LOCATION: [address], [place] [address]                   Office [phone]   [facility] LOCATION: [address] [place], [place] [address] Office [phone]

## 2024-05-19 ENCOUNTER — Encounter: Payer: Self-pay | Admitting: Oncology

## 2024-05-19 ENCOUNTER — Inpatient Hospital Stay: Attending: Oncology | Admitting: Oncology

## 2024-05-19 VITALS — BP 117/67 | HR 90 | Temp 97.3°F | Resp 16 | Wt 233.0 lb

## 2024-05-19 DIAGNOSIS — Z853 Personal history of malignant neoplasm of breast: Secondary | ICD-10-CM | POA: Diagnosis present

## 2024-05-19 DIAGNOSIS — Z9012 Acquired absence of left breast and nipple: Secondary | ICD-10-CM | POA: Diagnosis not present

## 2024-05-19 DIAGNOSIS — R918 Other nonspecific abnormal finding of lung field: Secondary | ICD-10-CM | POA: Insufficient documentation

## 2024-05-19 DIAGNOSIS — Z923 Personal history of irradiation: Secondary | ICD-10-CM | POA: Diagnosis not present

## 2024-05-19 DIAGNOSIS — C50822 Malignant neoplasm of overlapping sites of left male breast: Secondary | ICD-10-CM
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
## 2024-05-19 NOTE — Progress Notes (Signed)
 Survivorship Care Plan visit completed.  Treatment summary reviewed and given to patient.  ASCO answers booklet reviewed and given to patient.  CARE program and Cancer Transitions discussed with patient along with other resources cancer center offers to patients and caregivers.  Patient verbalized understanding.

## 2024-05-22 ENCOUNTER — Other Ambulatory Visit: Payer: Self-pay

## 2024-05-22 ENCOUNTER — Encounter: Payer: Self-pay | Admitting: Oncology

## 2024-05-22 DIAGNOSIS — E119 Type 2 diabetes mellitus without complications: Secondary | ICD-10-CM

## 2024-05-22 NOTE — Telephone Encounter (Signed)
 Fax received not given by our office ok to refill as pended

## 2024-05-23 MED ORDER — METFORMIN HCL 500 MG PO TABS: 500.0000 mg | ORAL_TABLET | Freq: Two times a day (BID) | ORAL | 1 refills | Status: AC

## 2024-05-26 ENCOUNTER — Ambulatory Visit: Admitting: Cardiology

## 2024-05-27 ENCOUNTER — Encounter: Payer: Self-pay | Admitting: Cardiology

## 2024-05-27 ENCOUNTER — Ambulatory Visit: Attending: Cardiology | Admitting: Cardiology

## 2024-05-27 VITALS — BP 120/60 | HR 84 | Ht 74.0 in | Wt 233.2 lb

## 2024-05-27 DIAGNOSIS — R0602 Shortness of breath: Secondary | ICD-10-CM | POA: Diagnosis present

## 2024-05-27 DIAGNOSIS — R600 Localized edema: Secondary | ICD-10-CM | POA: Insufficient documentation

## 2024-05-27 DIAGNOSIS — I10 Essential (primary) hypertension: Secondary | ICD-10-CM | POA: Insufficient documentation

## 2024-05-27 DIAGNOSIS — E782 Mixed hyperlipidemia: Secondary | ICD-10-CM | POA: Insufficient documentation

## 2024-05-27 DIAGNOSIS — Z720 Tobacco use: Secondary | ICD-10-CM | POA: Insufficient documentation

## 2024-05-27 MED ORDER — BEMPEDOIC ACID 180 MG PO TABS: 180.0000 mg | ORAL_TABLET | Freq: Every day | ORAL | 3 refills | Status: AC

## 2024-05-27 NOTE — Patient Instructions (Signed)
 Medication Instructions:  Your physician recommends that you continue on your current medications as directed. Please refer to the Current Medication list given to you today.   *If you need a refill on your cardiac medications before your next appointment, please call your pharmacy*  Lab Work: Your provider would like for you to return in October to have the following labs drawn: Mag.   Please go to [facility] [address] ([facility]) [address], [place] [address] You do not need an appointment.  They are open from 8 am- 4:30 pm.  Lunch from 1:00 pm- 2:00 pm You Do not need to be fasting.   You may also go to one of the following [facility]:  [address] [place], [place] [address] Phone: [phone] Lab hours: Mon-Fri 8 am- 5 pm    Lunch 12 pm- 1 pm  [address] [place],  [place]  [address]  [place]  Phone: [phone] Lab hours: 7 am- 4 pm Lunch 12 pm-1 pm   [address] [place],  [place]  [address]  [place]  Phone: [phone] Lab hours: Mon-Fri 8 am- 5 pm    Lunch 12 pm- 1 pm  If you have labs (blood work) drawn today and your tests are completely normal, you will receive your results only by: MyChart Message (if you have MyChart) OR A paper copy in the mail If you have any lab test that is abnormal or we need to change your treatment, we will call you to review the results.  Testing/Procedures: No test ordered today   Follow-Up: At [facility], you and your health needs are our priority.  As part of our continuing mission to provide you with exceptional heart care, our providers are all part of one team.  This team includes your primary Cardiologist (physician) and Advanced Practice Providers or APPs (Physician Assistants and Nurse Practitioners) who all work together to provide you with the care you need, when you need it.  Your next appointment:   6 month(s)  Provider:   You may see Redell Cave, MD or one of the following Advanced  Practice Providers on your designated Care Team:   Lonni Meager, NP Lesley Maffucci, PA-C Bernardino Bring, PA-C Cadence Culebra, PA-C Tylene Lunch, NP Barnie Hila, NP

## 2024-05-27 NOTE — Progress Notes (Signed)
 Cardiology Office Note   Date:  05/27/2024  ID:  Benjamin Armstrong, DOB 06-30-1956, MRN [id number] PCP: Wendee Lynwood HERO, NP  [facility] Providers Cardiologist:  Redell Cave, MD Cardiology APP:  Gerard Frederick, NP     History of Present Illness Benjamin Armstrong is a 68 y.o. male with past medical history of hypertension, mixed hyperlipidemia, bilateral leg edema, tobacco abuse, left breast cancer status postresection, lung cancer, who presents today for follow-up.   Echocardiogram in 6/21 showed normal systolic function with EF of 60 to 65%, with impaired relaxation.  Exercise tolerance test in 02/2019 revealed submaximal stress levels, no evidence of inducible ischemia.   He was last seen in clinic 01/08/2023 by Dr.Agbor-Etang.  At that time he had complaints of peripheral edema since starting therapy for lung cancer.  He was started on furosemide  with improvement in symptoms.  Continues to smoke although he had cut back.  Blood pressures are running slightly low so losartan  been decreased and eventually stopped by his PCP.  Blood pressures at home have been running normal.  In describing his arms.  Hyperlipidemia confessed to hold objects.  His rosuvastatin  was ultimately stopped and he was started on ezetimibe  10 mg daily.  He was stopped after repeat echocardiogram and his torsemide  was increased to 60 mg daily with a BMP in 10 days.   He was seen in clinic 02/01/2020 for continued swelling to his bilateral lower extremity but had improved slightly since being on diuretic therapy.  He occasionally had some shortness of breath and wheezing but used his rescue inhaler before.  He was scheduled for some follow-up labs but there were no medication changes that were made or further testing that was ordered.  He was evaluated in clinic 9//24 by Dr. Cave.  At that time his edema was adequately controlled.  He still continues to smoke.  Echo was obtained to evaluate his significant structural  abnormalities with a normal EF.  He had currently been completed prednisone taper due to possible inflammation/nerve damage.  Last BMP showed a mildly reduced potassium level.  He was continued on torsemide  60 mg daily and increased his KCl to 30 mill equivalents daily.   He was last seen in clinic 11/23/2023 by  Dr Cave.  Stated he had had a second opinion regarding his lung findings and was told that he does not have lung cancer.  Been compliant with torsemide  prescribed.  Was compliant with cholesterol medications.  Continues to smoke but was working on quitting.  He was continued on his current medication regimen without change and no further testing was ordered.  He returns to clinic today stating that overall from a cardiac perspective he has been doing well.  Denies any chest pain, palpitations, or chest pressure.  States that he continues to have some cramping in his hands and feet.  Occasionally has peripheral edema.  Continues with chronic shortness of breath that is unchanged that relates to smoking and COPD.  Continues to use rescue inhalers as needed.  States that he has been compliant with his current medication regimen.  Denies any recent hospitalizations or visits to the emergency department.  ROS: 10 point review of systems has been reviewed and considered negative the exception was listed in the HPI  Studies Reviewed EKG Interpretation Date/Time:  Tuesday May 27 2024 11:18:04 EDT Ventricular Rate:  84 PR Interval:  142 QRS Duration:  80 QT Interval:  368 QTC Calculation: 434 R Axis:   70  Text  Interpretation: Normal sinus rhythm Normal ECG When compared with ECG of 09-May-2023 11:12, No significant change was found Confirmed by Gerard Frederick ([id number]) on 05/27/2024 11:26:51 AM    TTE 01/23/23  1. Left ventricular ejection fraction, by estimation, is 55 to 60%. The  left ventricle has normal function. The left ventricle has no regional  wall motion abnormalities. Left  ventricular diastolic parameters are  consistent with Grade I diastolic  dysfunction (impaired relaxation).   2. Right ventricular systolic function is normal. The right ventricular  size is normal. Tricuspid regurgitation signal is inadequate for assessing  PA pressure.   3. The mitral valve is normal in structure. No evidence of mitral valve  regurgitation. No evidence of mitral stenosis.   4. The aortic valve is normal in structure. Aortic valve regurgitation is  not visualized. No aortic stenosis is present.   5. The inferior vena cava is normal in size with greater than 50%  respiratory variability, suggesting right atrial pressure of 3 mmHg. Risk Assessment/Calculations           Physical Exam VS:  BP 120/60 (BP Location: Right Arm, Patient Position: Sitting, Cuff Size: Normal)   Pulse 84   Ht 6' 2 (1.88 m)   Wt 233 lb 3.2 oz (105.8 kg)   SpO2 97%   BMI 29.94 kg/m        Wt Readings from Last 3 Encounters:  05/27/24 233 lb 3.2 oz (105.8 kg)  05/19/24 233 lb (105.7 kg)  04/23/24 236 lb (107 kg)    GEN: Well nourished, well developed in no acute distress NECK: No JVD; No carotid bruits CARDIAC: RRR, no murmurs, rubs, gallops RESPIRATORY:  Clear to auscultation without rales, wheezing or rhonchi  ABDOMEN: Soft, non-tender, non-distended EXTREMITIES: Trace pretibial edema to the bilateral lower extremities with compression socks on; No deformity   ASSESSMENT AND PLAN Mixed hyperlipidemia with last LDL 100.  He has upcoming labs with his PCP.  He is continued on bempedoic acid  180 mg daily, and ezetimibe  10 mg daily as well as fenofibrate  145 mg daily.  Peripheral edema in his bilateral lower extremities seems to have been well-controlled.  He continues to wear his compression stockings.  Remains on torsemide  60 mg every day.  Primary hypertension with blood pressure today 120/60.  Blood pressures remain well-controlled.  He has been continued on torsemide  60 mg daily.   He has been encouraged to continue to monitor his blood pressures at home as well.  EKG today reveals sinus rhythm with rate 84 beats no acute changes noted.  Chronic shortness of breath/COPD and current smoker where his last echocardiogram was completed revealing LVEF 55 to 60%, no RWMA, G1 DD, without valvular abnormalities.  He is continued on torsemide  60 mg daily and potassium 30 mill equivalents daily.  Has upcoming lab work with his PCP.  He is also encouraged to continue to use his rescue inhaler before strenuous activity to prevent bronchospasms and with his current smoking total cessation continues to be recommended.      Dispo: Patient to return to clinic with MD/APP in 6 months or sooner if needed for further evaluation.  Signed, Armanie Ullmer, NP

## 2024-05-30 ENCOUNTER — Encounter: Payer: Self-pay | Admitting: Pharmacist

## 2024-05-30 NOTE — Progress Notes (Signed)
 Brief Telephone Documentation Reason for Call: Patient's sister STEWARD Raspberry) left message regarding question for pharmacist   Summary of Call: Due for refill of Stiolto through PAP ([employer]). [facility] stated they need a new provider form sent in given patient has changed providers/healthcare offices [facility] [place]). Has 2 weeks left.   Chart reviewed, appears we also helped them with [employer] HLD grant for Nexletol  which expires 06/04/2024. Confirms they are still taking this medication, have a refill at the pharmacy being ordered currently.   [employer] provider pages filled out by pharmD and uploaded to PCP eFax folder for review and signature by new PCP.  Once signed, may be faxed to [facility] and uploaded to chart. [employer] pool cc'd).  Follow Up: Patient given direct line for further questions/concerns. Reminder set 06/05/24 [employer]well re-enrollment   Manuelita FABIENE Kobs, PharmD Clinical Pharmacist [facility] [phone]

## 2024-05-30 NOTE — Progress Notes (Signed)
 Form signed and placed in outgoing MA box

## 2024-06-05 ENCOUNTER — Encounter: Payer: Self-pay | Admitting: Pharmacist

## 2024-06-05 NOTE — Progress Notes (Signed)
 [employer] - Re-enrollment 2025   Medication(s): All cholesterol medications (Nexletol )   Currently Enrolled: EXPIRED 06/04/24   Application Status:  Approved for re-enrollment    [employer] ID: [id number] Fund: Hypercholesterolemia - Medicare Access Assistance Type: Co-pay Start Date: 06/05/2024 End Date: 06/04/2025               Rx Card: Card No.  [id number] RX BIN:  [id number] PCN:  [id number] Group:  [id number]   Called [facility] pharmacy and provided updated card information.    Manuelita FABIENE Kobs, PharmD Clinical Pharmacist [facility] [phone]

## 2024-06-20 ENCOUNTER — Telehealth: Payer: Self-pay | Admitting: Pharmacist

## 2024-06-20 DIAGNOSIS — J449 Chronic obstructive pulmonary disease, unspecified: Secondary | ICD-10-CM

## 2024-06-20 NOTE — Progress Notes (Signed)
 Brief Telephone Documentation Reason for Call: Patient's sister left message regarding question for pharmacist regarding PAP   Summary of Call: She notes patient received letter in the mail regarding [employer] application expiring in December.   Discussed re-enrollment for 2026. Patient/sister preference to drop off income docs and sign new application at upcoming lab or PCP visit.   [employer] 2026 Application filled out and uploaded to font office eFax folder for patient signature.   Follow Up: Patient given direct line for further questions/concerns.  Manuelita FABIENE Kobs, PharmD Clinical Pharmacist [facility] [phone]

## 2024-07-02 ENCOUNTER — Other Ambulatory Visit

## 2024-07-09 ENCOUNTER — Ambulatory Visit (INDEPENDENT_AMBULATORY_CARE_PROVIDER_SITE_OTHER): Admitting: Nurse Practitioner

## 2024-07-09 ENCOUNTER — Encounter: Payer: Self-pay | Admitting: Nurse Practitioner

## 2024-07-09 VITALS — BP 124/64 | HR 75 | Temp 98.3°F | Ht 71.0 in | Wt 235.2 lb

## 2024-07-09 DIAGNOSIS — Z125 Encounter for screening for malignant neoplasm of prostate: Secondary | ICD-10-CM | POA: Diagnosis not present

## 2024-07-09 DIAGNOSIS — I152 Hypertension secondary to endocrine disorders: Secondary | ICD-10-CM | POA: Diagnosis not present

## 2024-07-09 DIAGNOSIS — Z126 Encounter for screening for malignant neoplasm of bladder: Secondary | ICD-10-CM

## 2024-07-09 DIAGNOSIS — Z170 Estrogen receptor positive status [ER+]: Secondary | ICD-10-CM

## 2024-07-09 DIAGNOSIS — Z720 Tobacco use: Secondary | ICD-10-CM

## 2024-07-09 DIAGNOSIS — Z7984 Long term (current) use of oral hypoglycemic drugs: Secondary | ICD-10-CM

## 2024-07-09 DIAGNOSIS — E1159 Type 2 diabetes mellitus with other circulatory complications: Secondary | ICD-10-CM

## 2024-07-09 DIAGNOSIS — C50822 Malignant neoplasm of overlapping sites of left male breast: Secondary | ICD-10-CM

## 2024-07-09 DIAGNOSIS — J439 Emphysema, unspecified: Secondary | ICD-10-CM

## 2024-07-09 DIAGNOSIS — E119 Type 2 diabetes mellitus without complications: Secondary | ICD-10-CM

## 2024-07-09 LAB — COMPREHENSIVE METABOLIC PANEL WITH GFR
ALT: 25 U/L (ref 0–53)
AST: 28 U/L (ref 0–37)
Albumin: 3.9 g/dL (ref 3.5–5.2)
Alkaline Phosphatase: 42 U/L (ref 39–117)
BUN: 11 mg/dL (ref 6–23)
CO2: 29 meq/L (ref 19–32)
Calcium: 9.1 mg/dL (ref 8.4–10.5)
Chloride: 102 meq/L (ref 96–112)
Creatinine, Ser: 1.04 mg/dL (ref 0.40–1.50)
GFR: 72.85 mL/min (ref >60.00)
Glucose, Bld: 81 mg/dL (ref 70–99)
Potassium: 4.7 meq/L (ref 3.5–5.1)
Sodium: 138 meq/L (ref 135–145)
Total Bilirubin: 0.5 mg/dL (ref 0.2–1.2)
Total Protein: 6.1 g/dL (ref 6.0–8.3)

## 2024-07-09 LAB — CBC
HCT: 46 % (ref 39.0–52.0)
Hemoglobin: 15.3 g/dL (ref 13.0–17.0)
MCHC: 33.3 g/dL (ref 30.0–36.0)
MCV: 91.5 fl (ref 78.0–100.0)
Platelets: 226 K/uL (ref 150.0–400.0)
RBC: 5.03 Mil/uL (ref 4.22–5.81)
RDW: 13.9 % (ref 11.5–15.5)
WBC: 9.3 K/uL (ref 4.0–10.5)

## 2024-07-09 LAB — LIPID PANEL
Cholesterol: 129 mg/dL (ref 0–200)
HDL: 29 mg/dL — ABNORMAL LOW (ref >39.00)
LDL Cholesterol: 36 mg/dL (ref 0–99)
NonHDL: 99.83
Total CHOL/HDL Ratio: 4
Triglycerides: 317 mg/dL — ABNORMAL HIGH (ref 0.0–149.0)
VLDL: 63.4 mg/dL — ABNORMAL HIGH (ref 0.0–40.0)

## 2024-07-09 LAB — HEMOGLOBIN A1C: Hgb A1c MFr Bld: 6.5 % (ref 4.6–6.5)

## 2024-07-09 NOTE — Progress Notes (Signed)
 Established Patient Office Visit  Subjective   Patient ID: Benjamin Armstrong, male    DOB: May 09, 1956  Age: 68 y.o. MRN: [id number]  Chief Complaint  Patient presents with   Annual Exam    HPI  COPD: Patient currently maintained on butyryl inhaler as needed, Stiolto 2 puffs daily. Has not had to use the albuterol  once int he past month   HLD: Currently maintained on bempedoic acid  180 mg daily, ezetimibe  10 mg daily, fenofibrate  145 mg daily.  Patient is followed by cardiology  DM2: Patient currently maintained on metformin  500 mg twice daily. Does not check at home   ED: Currently maintained on sildenafil  100 mg daily followed by urology  BPH: Currently maintained on tamsulosin  0.4 mg daily followed by urology. Nocutira intermittnely most times once a night   for complete physical and follow up of chronic conditions.  Immunizations: -Tetanus: Completed in unsure get a local pharmacy -Influenza: Refused -Shingles refused -Pneumonia refused  Diet: Fair diet. He is eating 2 meals a day and sometimes he will snack. He is drinking coffee water and mtn dew.  Exercise: No regular exercise.  Eye exam: Completes annually. Glasses and up to date Dental exam: PRN   Colonoscopy: Cologuard ordered on 01/07/2024 not completed thus far Lung Cancer Screening: Does qualify. Last CT scan of chest was 10/17/2023.  Patient has history of pulmonary nodules and breast cancer..  No new or progressive findings patient does have pulmonary scarring and emphysema.  Patient is scheduled for repeat CT scan of chest on 11/12/2024  PSA: Due  Sleep: gong to bed around 1am and get up 430am. Does feel rested. He will fall asleep before the bedtime  Advance directive: does not have       Review of Systems  Constitutional:  Negative for chills and fever.  Respiratory:  Positive for shortness of breath.   Cardiovascular:  Negative for chest pain and leg swelling.  Gastrointestinal:  Negative for abdominal  pain, blood in stool, constipation, diarrhea, nausea and vomiting.  Genitourinary:  Negative for dysuria and hematuria.  Neurological:  Positive for dizziness. Negative for tingling and headaches.  Psychiatric/Behavioral:  Negative for hallucinations and suicidal ideas.       Objective:     BP 124/64   Pulse 75   Temp 98.3 F (36.8 C) (Oral)   Ht 5' 11 (1.803 m)   Wt 235 lb 3.2 oz (106.7 kg)   SpO2 98%   BMI 32.80 kg/m    Physical Exam Vitals and nursing note reviewed.  Constitutional:      Appearance: Normal appearance.  HENT:     Right Ear: Tympanic membrane, ear canal and external ear normal.     Left Ear: Tympanic membrane, ear canal and external ear normal.     Mouth/Throat:     Mouth: Mucous membranes are moist.     Pharynx: Oropharynx is clear.  Eyes:     Extraocular Movements: Extraocular movements intact.     Pupils: Pupils are equal, round, and reactive to light.  Cardiovascular:     Rate and Rhythm: Normal rate and regular rhythm.     Pulses: Normal pulses.     Heart sounds: Normal heart sounds.  Pulmonary:     Effort: Pulmonary effort is normal.     Breath sounds: Normal breath sounds.  Abdominal:     General: Bowel sounds are normal. There is no distension.     Palpations: There is no mass.  Tenderness: There is no abdominal tenderness.     Hernia: No hernia is present.  Musculoskeletal:     Right lower leg: No edema.     Left lower leg: No edema.  Lymphadenopathy:     Cervical: No cervical adenopathy.  Skin:    General: Skin is warm.  Neurological:     General: No focal deficit present.     Mental Status: He is alert.     Deep Tendon Reflexes:     Reflex Scores:      Bicep reflexes are 2+ on the right side and 2+ on the left side.      Patellar reflexes are 2+ on the right side and 2+ on the left side.    Comments: Bilateral upper and lower extremity strength 5/5  Psychiatric:        Mood and Affect: Mood normal.        Behavior:  Behavior normal.        Thought Content: Thought content normal.        Judgment: Judgment normal.    Title   Diabetic Foot Exam - detailed Is there a history of foot ulcer?: No Is there a foot ulcer now?: No Is there swelling?: Yes Is there elevated skin temperature?: No Is there abnormal foot shape?: No Is there a claw toe deformity?: No Are the toenails long?: No Are the toenails thick?: Yes Are the toenails ingrown?: No Pulse Foot Exam completed.: Yes   Right Posterior Tibialis: Present Left posterior Tibialis: Present   Right Dorsalis Pedis: Present Left Dorsalis Pedis: Present     Sensory Foot Exam Completed.: Yes Semmes-Weinstein Monofilament Test + means has sensation and - means no sensation      Image components are not supported.   Image components are not supported. Image components are not supported.  Tuning Fork Comments All 10 sites tested sensation intact bilaterally      No results found for any visits on 07/09/24.    The 10-year ASCVD risk score (Arnett DK, et al., 2019) is: 46.6%    Assessment & Plan:   Problem List Items Addressed This Visit       Cardiovascular and Mediastinum   Hypertension associated with diabetes (HCC)   Patient currently maintained on Torsemide  60 mg daily.  Blood pressure controlled continue medication as prescribed.  Patient is followed by cardiology      Relevant Orders   CBC   Comprehensive metabolic panel with GFR   TSH     Respiratory   COPD (chronic obstructive pulmonary disease) (HCC)   Currently maintained on Stiolto and albuterol  as needed.  Patient has infrequent use of albuterol  continue medication as prescribed        Endocrine   Controlled type 2 diabetes mellitus without complication, without long-term current use of insulin (HCC) - Primary   Currently maintained on metformin  500 mg twice daily.  Pending A1c today      Relevant Orders   CBC   Comprehensive metabolic panel with GFR    Hemoglobin A1c   Lipid panel   Microalbumin / creatinine urine ratio     Other   Tobacco abuse (Chronic)   History of same up-to-date on LDCT.  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  Other Visit Diagnoses       Screening for bladder cancer       Relevant Orders   Urine Microscopic     Screening for prostate cancer       Relevant Orders   PSA, Medicare       Return in about 6 months (around 01/06/2025) for DM recheck.    Adina Crandall, NP

## 2024-07-09 NOTE — Assessment & Plan Note (Signed)
 Patient currently maintained on Torsemide  60 mg daily.  Blood pressure controlled continue medication as prescribed.  Patient is followed by cardiology

## 2024-07-09 NOTE — Assessment & Plan Note (Signed)
 Currently maintained on metformin  500 mg twice daily.  Pending A1c today

## 2024-07-09 NOTE — Assessment & Plan Note (Signed)
 History of same up-to-date on LDCT.  Pending microscopic urine to rule out hematuria

## 2024-07-09 NOTE — Assessment & Plan Note (Signed)
 Currently followed by oncology and maintained on tamoxifen .

## 2024-07-09 NOTE — Assessment & Plan Note (Signed)
 Currently maintained on Stiolto and albuterol  as needed.  Patient has infrequent use of albuterol  continue medication as prescribed

## 2024-07-10 LAB — PSA, MEDICARE: PSA: 1.44 ng/mL (ref 0.10–4.00)

## 2024-07-10 LAB — TSH: TSH: 1.89 u[IU]/mL (ref 0.35–5.50)

## 2024-07-11 ENCOUNTER — Ambulatory Visit: Payer: Self-pay | Admitting: Nurse Practitioner

## 2024-07-13 ENCOUNTER — Encounter: Payer: Self-pay | Admitting: Cardiology

## 2024-07-14 ENCOUNTER — Other Ambulatory Visit: Payer: Self-pay

## 2024-07-14 MED ORDER — ICOSAPENT ETHYL 1 G PO CAPS: 2.0000 g | ORAL_CAPSULE | Freq: Two times a day (BID) | ORAL | 3 refills | Status: AC

## 2024-07-14 MED ORDER — ICOSAPENT ETHYL 1 G PO CAPS: 2.0000 g | ORAL_CAPSULE | Freq: Every day | ORAL | 3 refills | Status: DC

## 2024-08-14 ENCOUNTER — Encounter: Payer: Self-pay | Admitting: Podiatry

## 2024-08-14 ENCOUNTER — Ambulatory Visit: Admitting: Podiatry

## 2024-08-14 DIAGNOSIS — E119 Type 2 diabetes mellitus without complications: Secondary | ICD-10-CM | POA: Diagnosis not present

## 2024-08-14 DIAGNOSIS — M79675 Pain in left toe(s): Secondary | ICD-10-CM | POA: Diagnosis not present

## 2024-08-14 DIAGNOSIS — B351 Tinea unguium: Secondary | ICD-10-CM | POA: Diagnosis not present

## 2024-08-14 DIAGNOSIS — M79674 Pain in right toe(s): Secondary | ICD-10-CM

## 2024-08-14 NOTE — Progress Notes (Unsigned)
°  Subjective:  Patient ID: Benjamin Armstrong, male    DOB: August 22, 1956,  MRN: [id number]  Benjamin Armstrong presents to clinic today for {jgcomplaint:23593}  Chief Complaint  Patient presents with   Nail Problem    Thick painful toenails, 3 month follow up    Diabetes    A1C  6.5   New problem(s): None. {jgcomplaint:23593}  PCP is Wendee Lynwood HERO, NP.  Allergies[1]  Review of Systems: Negative except as noted in the HPI.  Objective: No changes noted in today's physical examination. There were no vitals filed for this visit. Benjamin Armstrong is a pleasant 68 y.o. male {jgbodyhabitus:24098} AAO x 3.  Assessment/Plan: No diagnosis found.  No orders of the defined types were placed in this encounter.   None {Jgplan:23602::-Patient/POA to call should there be question/concern in the interim.}   Return in about 3 months (around 11/12/2024).  Delon LITTIE Merlin, DPM      [place] LOCATION: [address] [place] [address]                   Office [phone]    LOCATION: [address] [place], [place] [address] Office [phone]     [1]  Allergies Allergen Reactions   Bee Venom    Tape Rash    bandaids

## 2024-09-02 ENCOUNTER — Telehealth: Payer: Self-pay | Admitting: Pharmacist

## 2024-09-02 NOTE — Progress Notes (Signed)
 Brief Telephone Documentation Reason for Call: Patient's sister left message regarding question for pharmacist on PAP refills for Stiolto inhaler  Summary of Call: States patient has opened his last box of Stiolto today (30-month supply).  Calling to ensure re-enrolled for 2026/refills.   Appears patient is enrolled through 10/08/24. Should have several refills remaining.   Called [employer] who states his enrollment actually ends 09/03/25 though does have remaining refills.  Refill requested today which the program states they will send out since enrollment is still technically active.    2026 Re-enrollment due: Completed patient portion and uploaded to front office eFax folder. Nena (sister) will pick up application and provide to patient for signature in January.  They plan to return signed application to front office with a copy of Social Security Income letter once received.   Follow Up: Patient given direct line for further questions/concerns.  Manuelita FABIENE Kobs, PharmD, BCACP, CPP Clinical Pharmacist Practitioner [facility] Ph: [phone]

## 2024-09-02 NOTE — Telephone Encounter (Signed)
Duplicate. In error

## 2024-09-21 ENCOUNTER — Encounter: Payer: Self-pay | Admitting: Oncology

## 2024-09-22 ENCOUNTER — Other Ambulatory Visit: Payer: Self-pay | Admitting: *Deleted

## 2024-09-22 MED ORDER — TAMOXIFEN CITRATE 20 MG PO TABS: 20.0000 mg | ORAL_TABLET | Freq: Every day | ORAL | 1 refills | Status: AC

## 2024-09-26 ENCOUNTER — Telehealth: Payer: Self-pay | Admitting: Nurse Practitioner

## 2024-09-26 NOTE — Telephone Encounter (Signed)
 Patient sister arrived in office to drop off PAP with income proof

## 2024-10-01 ENCOUNTER — Encounter: Payer: Self-pay | Admitting: Pharmacist

## 2024-10-01 NOTE — Progress Notes (Signed)
 Patient Assistance Program (PAP) Application   Manufacturer: [employer] ([employer])    (Re-enrollment) Medication(s): Stiolto  Patient Portion of Application:  Returned to clinic along with proof of income. Faxed to [facility] Med Advocate team 10/01/24 Income Documentation: [phone]  Provider Portion of Application:  10/01/24: Provider portion completed by Pharmacist and placed in PCP inbox for signature. (Inbox in office) Prescription(s): Included in PAP application.   Next Steps: [x]    Provider portion of application filled out and placed in office inbox for PCP signature []    CMA: Upon PCP signature, Application to be faxed to [facility] PAP team AND scanned to chart: [facility] PAP Team: CPhT Patient Advocate Team Fax: [phone] []    Med Advocate Team: Upon receipt via Med Advocate Onbase, please fax/submit to program.   Note routed to PCP Clinic Pool to ensure PCP signature is obtained and application is faxed. Patient Advocate PAP Spreadsheet updated and Pool cc'd as FYI (application not submitted by clinic, Advocate team to fax to company once received)  [facility] clinic team - Please document as Next Steps are completed in office*

## 2024-10-08 NOTE — Progress Notes (Signed)
 Form completed and placed in outgoing MA box

## 2024-10-09 NOTE — Progress Notes (Signed)
 Faxed forms to [phone]

## 2024-10-10 ENCOUNTER — Telehealth: Payer: Self-pay

## 2024-10-10 NOTE — Telephone Encounter (Signed)
 Provider portion of [employer] application from 10/01/24 indexed to patient media. There is no copy of patient portion or income  in media or in  [place]. (Per Lindsay's note from 10/01/24.)  Ana have you seen this?

## 2024-11-05 ENCOUNTER — Ambulatory Visit: Admitting: Urology

## 2024-11-10 ENCOUNTER — Ambulatory Visit: Admitting: Dermatology

## 2024-11-12 ENCOUNTER — Other Ambulatory Visit

## 2024-11-13 ENCOUNTER — Ambulatory Visit: Admitting: Podiatry

## 2024-11-26 ENCOUNTER — Ambulatory Visit: Admitting: Cardiology

## 2024-11-26 ENCOUNTER — Ambulatory Visit: Admitting: Oncology

## 2025-01-06 ENCOUNTER — Ambulatory Visit: Admitting: Nurse Practitioner

## 2025-04-16 ENCOUNTER — Ambulatory Visit

## 2025-04-17 ENCOUNTER — Ambulatory Visit
